# Patient Record
Sex: Female | Born: 1960
Health system: Southern US, Community
[De-identification: ages and names within clinical notes are randomized; demographics above are authoritative.]

## PROBLEM LIST (undated history)

## (undated) DIAGNOSIS — I6529 Occlusion and stenosis of unspecified carotid artery: Secondary | ICD-10-CM

## (undated) DIAGNOSIS — R51 Headache: Secondary | ICD-10-CM

## (undated) DIAGNOSIS — F329 Major depressive disorder, single episode, unspecified: Secondary | ICD-10-CM

## (undated) DIAGNOSIS — R519 Headache, unspecified: Secondary | ICD-10-CM

## (undated) DIAGNOSIS — I10 Essential (primary) hypertension: Secondary | ICD-10-CM

## (undated) DIAGNOSIS — F32A Depression, unspecified: Secondary | ICD-10-CM

## (undated) HISTORY — DX: Major depressive disorder, single episode, unspecified: F32.9

## (undated) HISTORY — DX: Headache: R51

## (undated) HISTORY — DX: Headache, unspecified: R51.9

## (undated) HISTORY — DX: Depression, unspecified: F32.A

## (undated) HISTORY — DX: Occlusion and stenosis of unspecified carotid artery: I65.29

---

## 1998-06-20 ENCOUNTER — Emergency Department (HOSPITAL_COMMUNITY): Admission: EM | Admit: 1998-06-20 | Discharge: 1998-06-20 | Payer: Self-pay | Admitting: Emergency Medicine

## 1999-05-26 ENCOUNTER — Emergency Department (HOSPITAL_COMMUNITY): Admission: EM | Admit: 1999-05-26 | Discharge: 1999-05-26 | Payer: Self-pay | Admitting: Emergency Medicine

## 1999-05-27 ENCOUNTER — Encounter: Payer: Self-pay | Admitting: Emergency Medicine

## 1999-05-28 ENCOUNTER — Emergency Department (HOSPITAL_COMMUNITY): Admission: EM | Admit: 1999-05-28 | Discharge: 1999-05-28 | Payer: Self-pay | Admitting: Emergency Medicine

## 1999-06-02 ENCOUNTER — Emergency Department (HOSPITAL_COMMUNITY): Admission: EM | Admit: 1999-06-02 | Discharge: 1999-06-02 | Payer: Self-pay | Admitting: Emergency Medicine

## 1999-08-28 ENCOUNTER — Encounter: Payer: Self-pay | Admitting: *Deleted

## 1999-08-28 ENCOUNTER — Emergency Department (HOSPITAL_COMMUNITY): Admission: EM | Admit: 1999-08-28 | Discharge: 1999-08-28 | Payer: Self-pay | Admitting: Emergency Medicine

## 1999-09-21 ENCOUNTER — Emergency Department (HOSPITAL_COMMUNITY): Admission: EM | Admit: 1999-09-21 | Discharge: 1999-09-21 | Payer: Self-pay | Admitting: Emergency Medicine

## 1999-09-30 ENCOUNTER — Emergency Department (HOSPITAL_COMMUNITY): Admission: EM | Admit: 1999-09-30 | Discharge: 1999-09-30 | Payer: Self-pay | Admitting: Internal Medicine

## 2000-08-27 ENCOUNTER — Emergency Department (HOSPITAL_COMMUNITY): Admission: EM | Admit: 2000-08-27 | Discharge: 2000-08-27 | Payer: Self-pay | Admitting: Emergency Medicine

## 2000-09-07 ENCOUNTER — Emergency Department (HOSPITAL_COMMUNITY): Admission: EM | Admit: 2000-09-07 | Discharge: 2000-09-07 | Payer: Self-pay | Admitting: Emergency Medicine

## 2001-02-10 ENCOUNTER — Emergency Department (HOSPITAL_COMMUNITY): Admission: EM | Admit: 2001-02-10 | Discharge: 2001-02-10 | Payer: Self-pay | Admitting: Emergency Medicine

## 2001-02-14 ENCOUNTER — Emergency Department (HOSPITAL_COMMUNITY): Admission: EM | Admit: 2001-02-14 | Discharge: 2001-02-14 | Payer: Self-pay | Admitting: Emergency Medicine

## 2001-11-24 ENCOUNTER — Emergency Department (HOSPITAL_COMMUNITY): Admission: EM | Admit: 2001-11-24 | Discharge: 2001-11-24 | Payer: Self-pay | Admitting: Emergency Medicine

## 2002-04-04 ENCOUNTER — Ambulatory Visit (HOSPITAL_COMMUNITY): Admission: RE | Admit: 2002-04-04 | Discharge: 2002-04-04 | Payer: Self-pay | Admitting: Family Medicine

## 2003-05-28 ENCOUNTER — Encounter: Payer: Self-pay | Admitting: Emergency Medicine

## 2003-05-28 ENCOUNTER — Emergency Department (HOSPITAL_COMMUNITY): Admission: EM | Admit: 2003-05-28 | Discharge: 2003-05-28 | Payer: Self-pay | Admitting: Emergency Medicine

## 2003-09-23 ENCOUNTER — Emergency Department (HOSPITAL_COMMUNITY): Admission: EM | Admit: 2003-09-23 | Discharge: 2003-09-23 | Payer: Self-pay | Admitting: Emergency Medicine

## 2004-08-26 ENCOUNTER — Ambulatory Visit (HOSPITAL_COMMUNITY): Payer: Self-pay | Admitting: Family Medicine

## 2004-09-27 ENCOUNTER — Ambulatory Visit: Payer: Self-pay | Admitting: Nurse Practitioner

## 2004-10-10 ENCOUNTER — Ambulatory Visit (HOSPITAL_COMMUNITY): Admission: RE | Admit: 2004-10-10 | Discharge: 2004-10-10 | Payer: Self-pay | Admitting: Family Medicine

## 2004-10-18 ENCOUNTER — Ambulatory Visit: Payer: Self-pay | Admitting: Nurse Practitioner

## 2004-10-21 ENCOUNTER — Ambulatory Visit: Payer: Self-pay | Admitting: *Deleted

## 2004-11-04 ENCOUNTER — Ambulatory Visit: Payer: Self-pay | Admitting: Nurse Practitioner

## 2005-06-23 ENCOUNTER — Emergency Department (HOSPITAL_COMMUNITY): Admission: EM | Admit: 2005-06-23 | Discharge: 2005-06-23 | Payer: Self-pay | Admitting: Emergency Medicine

## 2005-07-13 ENCOUNTER — Emergency Department (HOSPITAL_COMMUNITY): Admission: EM | Admit: 2005-07-13 | Discharge: 2005-07-13 | Payer: Self-pay | Admitting: Emergency Medicine

## 2006-02-09 ENCOUNTER — Emergency Department (HOSPITAL_COMMUNITY): Admission: EM | Admit: 2006-02-09 | Discharge: 2006-02-09 | Payer: Self-pay | Admitting: Emergency Medicine

## 2006-05-25 ENCOUNTER — Ambulatory Visit: Payer: Self-pay | Admitting: Nurse Practitioner

## 2006-06-04 ENCOUNTER — Ambulatory Visit: Payer: Self-pay | Admitting: Nurse Practitioner

## 2006-10-14 ENCOUNTER — Ambulatory Visit: Payer: Self-pay | Admitting: Nurse Practitioner

## 2006-10-26 ENCOUNTER — Emergency Department (HOSPITAL_COMMUNITY): Admission: EM | Admit: 2006-10-26 | Discharge: 2006-10-26 | Payer: Self-pay | Admitting: Emergency Medicine

## 2006-10-27 ENCOUNTER — Ambulatory Visit: Payer: Self-pay | Admitting: Nurse Practitioner

## 2006-11-02 ENCOUNTER — Emergency Department (HOSPITAL_COMMUNITY): Admission: EM | Admit: 2006-11-02 | Discharge: 2006-11-02 | Payer: Self-pay | Admitting: Emergency Medicine

## 2006-11-16 ENCOUNTER — Ambulatory Visit: Payer: Self-pay | Admitting: Nurse Practitioner

## 2006-11-16 ENCOUNTER — Encounter (INDEPENDENT_AMBULATORY_CARE_PROVIDER_SITE_OTHER): Payer: Self-pay | Admitting: Nurse Practitioner

## 2007-01-19 ENCOUNTER — Ambulatory Visit: Payer: Self-pay | Admitting: Nurse Practitioner

## 2007-04-16 ENCOUNTER — Ambulatory Visit: Payer: Self-pay | Admitting: Family Medicine

## 2007-04-23 ENCOUNTER — Ambulatory Visit (HOSPITAL_COMMUNITY): Admission: RE | Admit: 2007-04-23 | Discharge: 2007-04-23 | Payer: Self-pay | Admitting: Nurse Practitioner

## 2007-06-02 ENCOUNTER — Other Ambulatory Visit: Admission: RE | Admit: 2007-06-02 | Discharge: 2007-06-02 | Payer: Self-pay | Admitting: Obstetrics and Gynecology

## 2007-06-02 ENCOUNTER — Encounter: Payer: Self-pay | Admitting: Obstetrics and Gynecology

## 2007-06-02 ENCOUNTER — Ambulatory Visit: Payer: Self-pay | Admitting: *Deleted

## 2007-06-16 ENCOUNTER — Encounter (INDEPENDENT_AMBULATORY_CARE_PROVIDER_SITE_OTHER): Payer: Self-pay | Admitting: *Deleted

## 2007-06-18 ENCOUNTER — Encounter: Payer: Self-pay | Admitting: Obstetrics and Gynecology

## 2007-06-18 ENCOUNTER — Ambulatory Visit: Payer: Self-pay | Admitting: Obstetrics & Gynecology

## 2007-06-18 ENCOUNTER — Other Ambulatory Visit: Admission: RE | Admit: 2007-06-18 | Discharge: 2007-06-18 | Payer: Self-pay | Admitting: Obstetrics and Gynecology

## 2007-09-21 ENCOUNTER — Encounter: Payer: Self-pay | Admitting: Obstetrics & Gynecology

## 2007-09-21 ENCOUNTER — Ambulatory Visit: Payer: Self-pay | Admitting: Obstetrics & Gynecology

## 2007-09-21 ENCOUNTER — Inpatient Hospital Stay (HOSPITAL_COMMUNITY): Admission: RE | Admit: 2007-09-21 | Discharge: 2007-09-23 | Payer: Self-pay | Admitting: Obstetrics & Gynecology

## 2007-09-22 HISTORY — PX: ABDOMINAL HYSTERECTOMY: SHX81

## 2007-09-28 ENCOUNTER — Ambulatory Visit: Payer: Self-pay | Admitting: Obstetrics and Gynecology

## 2007-10-01 ENCOUNTER — Ambulatory Visit: Payer: Self-pay | Admitting: Family Medicine

## 2007-10-08 ENCOUNTER — Ambulatory Visit: Payer: Self-pay | Admitting: Obstetrics & Gynecology

## 2007-10-13 ENCOUNTER — Ambulatory Visit: Payer: Self-pay | Admitting: Internal Medicine

## 2008-02-03 ENCOUNTER — Encounter (INDEPENDENT_AMBULATORY_CARE_PROVIDER_SITE_OTHER): Payer: Self-pay | Admitting: Nurse Practitioner

## 2008-02-03 ENCOUNTER — Ambulatory Visit: Payer: Self-pay | Admitting: Internal Medicine

## 2008-02-03 LAB — CONVERTED CEMR LAB
ALT: 61 units/L — ABNORMAL HIGH (ref 0–35)
AST: 82 units/L — ABNORMAL HIGH (ref 0–37)
Albumin: 4.6 g/dL (ref 3.5–5.2)
Alkaline Phosphatase: 96 units/L (ref 39–117)
BUN: 12 mg/dL (ref 6–23)
Basophils Absolute: 0 10*3/uL (ref 0.0–0.1)
Basophils Relative: 0 % (ref 0–1)
CO2: 26 meq/L (ref 19–32)
Calcium: 10.2 mg/dL (ref 8.4–10.5)
Chloride: 102 meq/L (ref 96–112)
Cholesterol: 151 mg/dL (ref 0–200)
Creatinine, Ser: 0.94 mg/dL (ref 0.40–1.20)
Eosinophils Absolute: 0.1 10*3/uL (ref 0.0–0.7)
Eosinophils Relative: 1 % (ref 0–5)
Glucose, Bld: 94 mg/dL (ref 70–99)
HCT: 44.7 % (ref 36.0–46.0)
HDL: 89 mg/dL (ref >39)
Hemoglobin: 14.3 g/dL (ref 12.0–15.0)
LDL Cholesterol: 41 mg/dL (ref 0–99)
Lymphocytes Relative: 34 % (ref 12–46)
Lymphs Abs: 2.6 10*3/uL (ref 0.7–4.0)
MCHC: 32 g/dL (ref 30.0–36.0)
MCV: 83.6 fL (ref 78.0–100.0)
Monocytes Absolute: 0.7 10*3/uL (ref 0.1–1.0)
Monocytes Relative: 9 % (ref 3–12)
Neutro Abs: 4.3 10*3/uL (ref 1.7–7.7)
Neutrophils Relative %: 56 % (ref 43–77)
Platelets: 339 10*3/uL (ref 150–400)
Potassium: 4.4 meq/L (ref 3.5–5.3)
RBC: 5.35 M/uL — ABNORMAL HIGH (ref 3.87–5.11)
RDW: 19.1 % — ABNORMAL HIGH (ref 11.5–15.5)
Sodium: 141 meq/L (ref 135–145)
TSH: 1.398 microintl units/mL (ref 0.350–5.50)
Total Bilirubin: 0.4 mg/dL (ref 0.3–1.2)
Total CHOL/HDL Ratio: 1.7
Total Protein: 8.3 g/dL (ref 6.0–8.3)
Triglycerides: 103 mg/dL (ref <150)
VLDL: 21 mg/dL (ref 0–40)
WBC: 7.7 10*3/uL (ref 4.0–10.5)

## 2008-02-07 ENCOUNTER — Ambulatory Visit (HOSPITAL_COMMUNITY): Admission: RE | Admit: 2008-02-07 | Discharge: 2008-02-07 | Payer: Self-pay | Admitting: Family Medicine

## 2008-03-16 ENCOUNTER — Encounter: Admission: RE | Admit: 2008-03-16 | Discharge: 2008-03-16 | Payer: Self-pay | Admitting: Cardiovascular Disease

## 2008-05-31 ENCOUNTER — Emergency Department (HOSPITAL_COMMUNITY): Admission: EM | Admit: 2008-05-31 | Discharge: 2008-05-31 | Payer: Self-pay | Admitting: Family Medicine

## 2008-06-15 ENCOUNTER — Emergency Department (HOSPITAL_COMMUNITY): Admission: EM | Admit: 2008-06-15 | Discharge: 2008-06-15 | Payer: Self-pay | Admitting: Emergency Medicine

## 2008-06-19 ENCOUNTER — Encounter (INDEPENDENT_AMBULATORY_CARE_PROVIDER_SITE_OTHER): Payer: Self-pay | Admitting: Nurse Practitioner

## 2008-06-19 ENCOUNTER — Ambulatory Visit: Payer: Self-pay | Admitting: Internal Medicine

## 2008-06-19 LAB — CONVERTED CEMR LAB
ALT: 97 units/L — ABNORMAL HIGH (ref 0–35)
AST: 79 units/L — ABNORMAL HIGH (ref 0–37)
Albumin: 4.8 g/dL (ref 3.5–5.2)
Alkaline Phosphatase: 113 units/L (ref 39–117)
BUN: 16 mg/dL (ref 6–23)
Basophils Absolute: 0 10*3/uL (ref 0.0–0.1)
Basophils Relative: 0 % (ref 0–1)
CO2: 28 meq/L (ref 19–32)
Calcium: 10.5 mg/dL (ref 8.4–10.5)
Chloride: 98 meq/L (ref 96–112)
Creatinine, Ser: 0.96 mg/dL (ref 0.40–1.20)
Eosinophils Absolute: 0.1 10*3/uL (ref 0.0–0.7)
Eosinophils Relative: 1 % (ref 0–5)
Glucose, Bld: 91 mg/dL (ref 70–99)
HCT: 41.8 % (ref 36.0–46.0)
Hemoglobin: 13.8 g/dL (ref 12.0–15.0)
Lymphocytes Relative: 39 % (ref 12–46)
Lymphs Abs: 4.8 10*3/uL — ABNORMAL HIGH (ref 0.7–4.0)
MCHC: 33 g/dL (ref 30.0–36.0)
MCV: 88.6 fL (ref 78.0–100.0)
Monocytes Absolute: 1.2 10*3/uL — ABNORMAL HIGH (ref 0.1–1.0)
Monocytes Relative: 10 % (ref 3–12)
Neutro Abs: 6.2 10*3/uL (ref 1.7–7.7)
Neutrophils Relative %: 50 % (ref 43–77)
Platelets: 401 10*3/uL — ABNORMAL HIGH (ref 150–400)
Potassium: 3.7 meq/L (ref 3.5–5.3)
RBC: 4.72 M/uL (ref 3.87–5.11)
RDW: 13.7 % (ref 11.5–15.5)
Sed Rate: 4 mm/hr (ref 0–22)
Sodium: 138 meq/L (ref 135–145)
TSH: 1.433 microintl units/mL (ref 0.350–4.50)
Total Bilirubin: 0.5 mg/dL (ref 0.3–1.2)
Total Protein: 8.4 g/dL — ABNORMAL HIGH (ref 6.0–8.3)
WBC: 12.3 10*3/uL — ABNORMAL HIGH (ref 4.0–10.5)

## 2008-06-22 ENCOUNTER — Ambulatory Visit (HOSPITAL_COMMUNITY): Admission: RE | Admit: 2008-06-22 | Discharge: 2008-06-22 | Payer: Self-pay | Admitting: Internal Medicine

## 2008-07-03 ENCOUNTER — Ambulatory Visit: Payer: Self-pay | Admitting: Internal Medicine

## 2008-07-10 ENCOUNTER — Ambulatory Visit: Payer: Self-pay | Admitting: Internal Medicine

## 2008-07-10 LAB — CONVERTED CEMR LAB
Basophils Absolute: 0 10*3/uL (ref 0.0–0.1)
Basophils Relative: 0 % (ref 0–1)
Eosinophils Absolute: 0 10*3/uL (ref 0.0–0.7)
Eosinophils Relative: 1 % (ref 0–5)
HCT: 44.7 % (ref 36.0–46.0)
HCV Quantitative: 8110000 intl units/mL — ABNORMAL HIGH (ref <43)
Hemoglobin: 14.2 g/dL (ref 12.0–15.0)
Lymphocytes Relative: 31 % (ref 12–46)
Lymphs Abs: 2.7 10*3/uL (ref 0.7–4.0)
MCHC: 31.8 g/dL (ref 30.0–36.0)
MCV: 89.6 fL (ref 78.0–100.0)
Monocytes Absolute: 0.7 10*3/uL (ref 0.1–1.0)
Monocytes Relative: 8 % (ref 3–12)
Neutro Abs: 5.1 10*3/uL (ref 1.7–7.7)
Neutrophils Relative %: 60 % (ref 43–77)
Platelets: 279 10*3/uL (ref 150–400)
RBC: 4.99 M/uL (ref 3.87–5.11)
RDW: 13.5 % (ref 11.5–15.5)
WBC: 8.5 10*3/uL (ref 4.0–10.5)

## 2008-09-15 ENCOUNTER — Ambulatory Visit: Payer: Self-pay | Admitting: Internal Medicine

## 2008-09-15 ENCOUNTER — Encounter (INDEPENDENT_AMBULATORY_CARE_PROVIDER_SITE_OTHER): Payer: Self-pay | Admitting: Adult Health

## 2008-09-15 LAB — CONVERTED CEMR LAB
ALT: 59 units/L — ABNORMAL HIGH (ref 0–35)
AST: 38 units/L — ABNORMAL HIGH (ref 0–37)
Albumin: 4.8 g/dL (ref 3.5–5.2)
Alkaline Phosphatase: 118 units/L — ABNORMAL HIGH (ref 39–117)
BUN: 14 mg/dL (ref 6–23)
Basophils Absolute: 0 10*3/uL (ref 0.0–0.1)
Basophils Relative: 0 % (ref 0–1)
CO2: 26 meq/L (ref 19–32)
Calcium: 10 mg/dL (ref 8.4–10.5)
Chloride: 101 meq/L (ref 96–112)
Creatinine, Ser: 1 mg/dL (ref 0.40–1.20)
Eosinophils Absolute: 0 10*3/uL (ref 0.0–0.7)
Eosinophils Relative: 1 % (ref 0–5)
Glucose, Bld: 84 mg/dL (ref 70–99)
HCT: 41.8 % (ref 36.0–46.0)
Hemoglobin: 13.4 g/dL (ref 12.0–15.0)
Lymphocytes Relative: 39 % (ref 12–46)
Lymphs Abs: 2.9 10*3/uL (ref 0.7–4.0)
MCHC: 32.1 g/dL (ref 30.0–36.0)
MCV: 89.5 fL (ref 78.0–100.0)
Monocytes Absolute: 0.7 10*3/uL (ref 0.1–1.0)
Monocytes Relative: 9 % (ref 3–12)
Neutro Abs: 3.8 10*3/uL (ref 1.7–7.7)
Neutrophils Relative %: 52 % (ref 43–77)
Platelets: 307 10*3/uL (ref 150–400)
Potassium: 4.2 meq/L (ref 3.5–5.3)
RBC: 4.67 M/uL (ref 3.87–5.11)
RDW: 12.9 % (ref 11.5–15.5)
Sodium: 142 meq/L (ref 135–145)
Total Bilirubin: 0.3 mg/dL (ref 0.3–1.2)
Total Protein: 8.1 g/dL (ref 6.0–8.3)
WBC: 7.3 10*3/uL (ref 4.0–10.5)

## 2008-09-26 ENCOUNTER — Ambulatory Visit: Payer: Self-pay | Admitting: Internal Medicine

## 2008-10-03 ENCOUNTER — Ambulatory Visit: Payer: Self-pay | Admitting: Internal Medicine

## 2008-10-10 ENCOUNTER — Ambulatory Visit: Payer: Self-pay | Admitting: Internal Medicine

## 2008-10-18 ENCOUNTER — Ambulatory Visit: Payer: Self-pay | Admitting: Internal Medicine

## 2008-10-19 ENCOUNTER — Ambulatory Visit: Payer: Self-pay | Admitting: Internal Medicine

## 2008-10-20 ENCOUNTER — Ambulatory Visit (HOSPITAL_COMMUNITY): Admission: RE | Admit: 2008-10-20 | Discharge: 2008-10-20 | Payer: Self-pay | Admitting: Adult Health

## 2008-10-20 ENCOUNTER — Ambulatory Visit: Payer: Self-pay | Admitting: Internal Medicine

## 2008-10-20 ENCOUNTER — Encounter (INDEPENDENT_AMBULATORY_CARE_PROVIDER_SITE_OTHER): Payer: Self-pay | Admitting: Adult Health

## 2008-10-20 LAB — CONVERTED CEMR LAB
ALT: 70 units/L — ABNORMAL HIGH (ref 0–35)
AST: 55 units/L — ABNORMAL HIGH (ref 0–37)
Albumin: 4.5 g/dL (ref 3.5–5.2)
Alkaline Phosphatase: 133 units/L — ABNORMAL HIGH (ref 39–117)
Anti Nuclear Antibody(ANA): NEGATIVE
BUN: 14 mg/dL (ref 6–23)
CO2: 26 meq/L (ref 19–32)
Calcium: 9.8 mg/dL (ref 8.4–10.5)
Chloride: 99 meq/L (ref 96–112)
Creatinine, Ser: 0.79 mg/dL (ref 0.40–1.20)
Glucose, Bld: 85 mg/dL (ref 70–99)
Potassium: 4.1 meq/L (ref 3.5–5.3)
Rhuematoid fact SerPl-aCnc: 113 intl units/mL — ABNORMAL HIGH (ref 0–20)
Sed Rate: 3 mm/hr (ref 0–22)
Sodium: 140 meq/L (ref 135–145)
Total Bilirubin: 0.5 mg/dL (ref 0.3–1.2)
Total Protein: 8 g/dL (ref 6.0–8.3)

## 2008-12-13 ENCOUNTER — Ambulatory Visit: Payer: Self-pay | Admitting: Internal Medicine

## 2008-12-20 ENCOUNTER — Ambulatory Visit (HOSPITAL_COMMUNITY): Admission: RE | Admit: 2008-12-20 | Discharge: 2008-12-20 | Payer: Self-pay | Admitting: Internal Medicine

## 2009-03-02 ENCOUNTER — Encounter: Payer: Self-pay | Admitting: Obstetrics & Gynecology

## 2009-03-02 ENCOUNTER — Ambulatory Visit: Payer: Self-pay | Admitting: Obstetrics & Gynecology

## 2009-03-05 ENCOUNTER — Ambulatory Visit (HOSPITAL_COMMUNITY): Admission: RE | Admit: 2009-03-05 | Discharge: 2009-03-05 | Payer: Self-pay | Admitting: Family Medicine

## 2009-03-06 ENCOUNTER — Ambulatory Visit (HOSPITAL_COMMUNITY): Admission: RE | Admit: 2009-03-06 | Discharge: 2009-03-06 | Payer: Self-pay | Admitting: Internal Medicine

## 2009-03-08 ENCOUNTER — Ambulatory Visit: Payer: Self-pay | Admitting: Internal Medicine

## 2009-03-23 ENCOUNTER — Ambulatory Visit: Payer: Self-pay | Admitting: Obstetrics & Gynecology

## 2009-04-05 ENCOUNTER — Ambulatory Visit: Payer: Self-pay | Admitting: Internal Medicine

## 2009-05-09 ENCOUNTER — Encounter (INDEPENDENT_AMBULATORY_CARE_PROVIDER_SITE_OTHER): Payer: Self-pay | Admitting: Adult Health

## 2009-05-09 ENCOUNTER — Ambulatory Visit: Payer: Self-pay | Admitting: Internal Medicine

## 2009-05-09 LAB — CONVERTED CEMR LAB
ALT: 64 units/L — ABNORMAL HIGH (ref 0–35)
AST: 56 units/L — ABNORMAL HIGH (ref 0–37)
Albumin: 4.5 g/dL (ref 3.5–5.2)
Alkaline Phosphatase: 123 units/L — ABNORMAL HIGH (ref 39–117)
BUN: 11 mg/dL (ref 6–23)
Basophils Absolute: 0 10*3/uL (ref 0.0–0.1)
Basophils Relative: 0 % (ref 0–1)
CO2: 27 meq/L (ref 19–32)
Calcium: 10.2 mg/dL (ref 8.4–10.5)
Chlamydia, DNA Probe: NEGATIVE
Chloride: 99 meq/L (ref 96–112)
Creatinine, Ser: 1.02 mg/dL (ref 0.40–1.20)
Eosinophils Absolute: 0.1 10*3/uL (ref 0.0–0.7)
Eosinophils Relative: 2 % (ref 0–5)
GC Probe Amp, Genital: NEGATIVE
Glucose, Bld: 103 mg/dL — ABNORMAL HIGH (ref 70–99)
HCT: 41.7 % (ref 36.0–46.0)
Hemoglobin: 13.3 g/dL (ref 12.0–15.0)
Lymphocytes Relative: 33 % (ref 12–46)
Lymphs Abs: 2.4 10*3/uL (ref 0.7–4.0)
MCHC: 31.9 g/dL (ref 30.0–36.0)
MCV: 85.3 fL (ref 78.0–100.0)
Microalb, Ur: 0.5 mg/dL (ref 0.00–1.89)
Monocytes Absolute: 0.6 10*3/uL (ref 0.1–1.0)
Monocytes Relative: 8 % (ref 3–12)
Neutro Abs: 4.2 10*3/uL (ref 1.7–7.7)
Neutrophils Relative %: 57 % (ref 43–77)
Platelets: 299 10*3/uL (ref 150–400)
Potassium: 4.3 meq/L (ref 3.5–5.3)
RBC: 4.89 M/uL (ref 3.87–5.11)
RDW: 14 % (ref 11.5–15.5)
Sodium: 139 meq/L (ref 135–145)
TSH: 0.784 microintl units/mL (ref 0.350–4.500)
Total Bilirubin: 0.3 mg/dL (ref 0.3–1.2)
Total Protein: 7.6 g/dL (ref 6.0–8.3)
Vit D, 25-Hydroxy: 32 ng/mL (ref 30–89)
WBC: 7.3 10*3/uL (ref 4.0–10.5)

## 2009-05-21 ENCOUNTER — Ambulatory Visit: Payer: Self-pay | Admitting: Internal Medicine

## 2009-06-18 ENCOUNTER — Ambulatory Visit: Payer: Self-pay | Admitting: Internal Medicine

## 2009-07-31 ENCOUNTER — Ambulatory Visit: Payer: Self-pay | Admitting: Internal Medicine

## 2009-08-29 ENCOUNTER — Ambulatory Visit: Payer: Self-pay | Admitting: Internal Medicine

## 2009-08-29 ENCOUNTER — Encounter (INDEPENDENT_AMBULATORY_CARE_PROVIDER_SITE_OTHER): Payer: Self-pay | Admitting: Adult Health

## 2009-08-29 LAB — CONVERTED CEMR LAB
ALT: 61 units/L — ABNORMAL HIGH (ref 0–35)
AST: 57 units/L — ABNORMAL HIGH (ref 0–37)
Albumin: 4.7 g/dL (ref 3.5–5.2)
Alkaline Phosphatase: 116 units/L (ref 39–117)
BUN: 22 mg/dL (ref 6–23)
Basophils Absolute: 0 10*3/uL (ref 0.0–0.1)
Basophils Relative: 0 % (ref 0–1)
CO2: 30 meq/L (ref 19–32)
Calcium: 10.4 mg/dL (ref 8.4–10.5)
Chloride: 99 meq/L (ref 96–112)
Creatinine, Ser: 1.06 mg/dL (ref 0.40–1.20)
Eosinophils Absolute: 0.1 10*3/uL (ref 0.0–0.7)
Eosinophils Relative: 1 % (ref 0–5)
Glucose, Bld: 92 mg/dL (ref 70–99)
HCT: 45.6 % (ref 36.0–46.0)
Hemoglobin: 14.7 g/dL (ref 12.0–15.0)
INR: 1.05 (ref <1.50)
Lymphocytes Relative: 35 % (ref 12–46)
Lymphs Abs: 3.2 10*3/uL (ref 0.7–4.0)
MCHC: 32.2 g/dL (ref 30.0–36.0)
MCV: 83.1 fL (ref 78.0–100.0)
Monocytes Absolute: 0.6 10*3/uL (ref 0.1–1.0)
Monocytes Relative: 6 % (ref 3–12)
Neutro Abs: 5.3 10*3/uL (ref 1.7–7.7)
Neutrophils Relative %: 57 % (ref 43–77)
Platelets: 335 10*3/uL (ref 150–400)
Potassium: 4 meq/L (ref 3.5–5.3)
Prothrombin Time: 13.6 s (ref 11.6–15.2)
RBC: 5.49 M/uL — ABNORMAL HIGH (ref 3.87–5.11)
RDW: 14.3 % (ref 11.5–15.5)
Sodium: 140 meq/L (ref 135–145)
Total Bilirubin: 0.4 mg/dL (ref 0.3–1.2)
Total Protein: 7.7 g/dL (ref 6.0–8.3)
WBC: 9.2 10*3/uL (ref 4.0–10.5)

## 2009-09-21 ENCOUNTER — Emergency Department (HOSPITAL_COMMUNITY): Admission: EM | Admit: 2009-09-21 | Discharge: 2009-09-21 | Payer: Self-pay | Admitting: Emergency Medicine

## 2009-10-03 ENCOUNTER — Ambulatory Visit: Payer: Self-pay | Admitting: Internal Medicine

## 2009-10-03 ENCOUNTER — Encounter (INDEPENDENT_AMBULATORY_CARE_PROVIDER_SITE_OTHER): Payer: Self-pay | Admitting: Adult Health

## 2009-10-03 LAB — CONVERTED CEMR LAB
ALT: 61 units/L — ABNORMAL HIGH (ref 0–35)
AST: 56 units/L — ABNORMAL HIGH (ref 0–37)
Albumin: 4.4 g/dL (ref 3.5–5.2)
Alkaline Phosphatase: 114 units/L (ref 39–117)
BUN: 21 mg/dL (ref 6–23)
CO2: 26 meq/L (ref 19–32)
Calcium: 9.9 mg/dL (ref 8.4–10.5)
Chloride: 101 meq/L (ref 96–112)
Creatinine, Ser: 0.83 mg/dL (ref 0.40–1.20)
Glucose, Bld: 103 mg/dL — ABNORMAL HIGH (ref 70–99)
Potassium: 3.9 meq/L (ref 3.5–5.3)
Sodium: 141 meq/L (ref 135–145)
Total Bilirubin: 0.4 mg/dL (ref 0.3–1.2)
Total Protein: 7.3 g/dL (ref 6.0–8.3)

## 2009-11-14 ENCOUNTER — Ambulatory Visit: Payer: Self-pay | Admitting: Internal Medicine

## 2009-12-11 ENCOUNTER — Encounter: Payer: Self-pay | Admitting: Emergency Medicine

## 2009-12-11 ENCOUNTER — Inpatient Hospital Stay (HOSPITAL_COMMUNITY): Admission: AD | Admit: 2009-12-11 | Discharge: 2009-12-12 | Payer: Self-pay | Admitting: Neurosurgery

## 2009-12-25 ENCOUNTER — Ambulatory Visit: Payer: Self-pay | Admitting: Family Medicine

## 2010-01-02 ENCOUNTER — Emergency Department (HOSPITAL_COMMUNITY): Admission: EM | Admit: 2010-01-02 | Discharge: 2010-01-02 | Payer: Self-pay | Admitting: Emergency Medicine

## 2010-01-22 ENCOUNTER — Ambulatory Visit (HOSPITAL_COMMUNITY): Admission: RE | Admit: 2010-01-22 | Discharge: 2010-01-22 | Payer: Self-pay | Admitting: Neurosurgery

## 2010-01-24 ENCOUNTER — Encounter (INDEPENDENT_AMBULATORY_CARE_PROVIDER_SITE_OTHER): Payer: Self-pay | Admitting: Adult Health

## 2010-01-24 ENCOUNTER — Ambulatory Visit: Payer: Self-pay | Admitting: Internal Medicine

## 2010-01-24 LAB — CONVERTED CEMR LAB
ALT: 41 units/L — ABNORMAL HIGH (ref 0–35)
AST: 38 units/L — ABNORMAL HIGH (ref 0–37)
Albumin: 4.4 g/dL (ref 3.5–5.2)
Alkaline Phosphatase: 95 units/L (ref 39–117)
BUN: 14 mg/dL (ref 6–23)
CO2: 27 meq/L (ref 19–32)
Calcium: 9.9 mg/dL (ref 8.4–10.5)
Chloride: 102 meq/L (ref 96–112)
Cholesterol: 136 mg/dL (ref 0–200)
Creatinine, Ser: 0.84 mg/dL (ref 0.40–1.20)
Glucose, Bld: 147 mg/dL — ABNORMAL HIGH (ref 70–99)
HDL: 45 mg/dL (ref >39)
LDL Cholesterol: 63 mg/dL (ref 0–99)
Potassium: 4.3 meq/L (ref 3.5–5.3)
Sodium: 141 meq/L (ref 135–145)
Total Bilirubin: 0.5 mg/dL (ref 0.3–1.2)
Total CHOL/HDL Ratio: 3
Total Protein: 7.2 g/dL (ref 6.0–8.3)
Triglycerides: 138 mg/dL (ref <150)
VLDL: 28 mg/dL (ref 0–40)

## 2010-02-18 ENCOUNTER — Ambulatory Visit: Payer: Self-pay | Admitting: Surgery

## 2010-02-23 ENCOUNTER — Emergency Department (HOSPITAL_COMMUNITY): Admission: EM | Admit: 2010-02-23 | Discharge: 2010-02-23 | Payer: Self-pay | Admitting: Emergency Medicine

## 2010-03-27 ENCOUNTER — Encounter (INDEPENDENT_AMBULATORY_CARE_PROVIDER_SITE_OTHER): Payer: Self-pay | Admitting: Adult Health

## 2010-03-27 ENCOUNTER — Ambulatory Visit: Payer: Self-pay | Admitting: Internal Medicine

## 2010-03-27 LAB — CONVERTED CEMR LAB: Microalb, Ur: 0.5 mg/dL (ref 0.00–1.89)

## 2010-04-02 ENCOUNTER — Ambulatory Visit (HOSPITAL_COMMUNITY): Admission: RE | Admit: 2010-04-02 | Discharge: 2010-04-02 | Payer: Self-pay | Admitting: Internal Medicine

## 2010-04-11 ENCOUNTER — Ambulatory Visit (HOSPITAL_COMMUNITY): Admission: RE | Admit: 2010-04-11 | Discharge: 2010-04-11 | Payer: Self-pay | Admitting: Family Medicine

## 2010-05-07 ENCOUNTER — Ambulatory Visit: Payer: Self-pay | Admitting: Family Medicine

## 2010-05-11 ENCOUNTER — Emergency Department (HOSPITAL_COMMUNITY): Admission: EM | Admit: 2010-05-11 | Discharge: 2010-05-11 | Payer: Self-pay | Admitting: Emergency Medicine

## 2010-07-04 ENCOUNTER — Ambulatory Visit: Payer: Self-pay | Admitting: Gastroenterology

## 2010-08-13 ENCOUNTER — Emergency Department (HOSPITAL_COMMUNITY)
Admission: EM | Admit: 2010-08-13 | Discharge: 2010-08-13 | Payer: Self-pay | Source: Home / Self Care | Admitting: Emergency Medicine

## 2010-10-29 NOTE — Miscellaneous (Signed)
Summary: VIP  Patient: Morgan Davenport [name] Note: All result statuses are Final unless otherwise noted.  Tests: (1) VIP (Medications)   LLIMPORTMEDS              "Result Below..."       RESULT: PENICILLIN V POTASSIUM TABS 500 MG*TAKE ONE (1) TABLET FOUR (4)  TIMES PER DAY*10/27/2006*Last Refill: [id number]*******   LLIMPORTMEDS              "Result Below..."       RESULT: METRONIDAZOLE TABS 500 MG*TAKE TWO (2) TABLETS TWICE DAILY FOR 2 DAYS*01/19/2007*Last Refill: [id number]*******   LLIMPORTMEDS              "Result Below..."       RESULT: LEXAPRO TABS 20 MG*TAKE ONE (1) TABLET EACH DAY*01/19/2007*Last Refill: Unknown*[id number]*******   LLIMPORTALLS              NKDA***  Note: An exclamation mark (!) indicates a result that was not dispersed into the flowsheet. Document Creation Date: 07/29/2007 3:05 PM _______________________________________________________________________  (1) Order result status: Final Collection or observation date-time: 06/16/2007 Requested date-time: 06/16/2007 Receipt date-time:  Reported date-time: 06/16/2007 Referring Physician:   Ordering Physician:   Specimen Source:  Source: [place] Order Number:  Lab site:

## 2010-10-30 IMAGING — CT CT HEAD W/O CM
1 series · 15 of 28 positions shown, 19 images · non-contrast
Comparison: 12/11/2009.

CLINICAL DATA: 48-year-old female with headache and nausea.

CT HEAD WITHOUT CONTRAST
TECHNIQUE: Contiguous axial images were obtained from the base of
the skull through the vertex without contrast.

[Series 2: brain · axial · 0.47mm/px · z∈[+111,+236]mm · 15 of 28 slices shown, 19 images]
[im 2/28  brain]
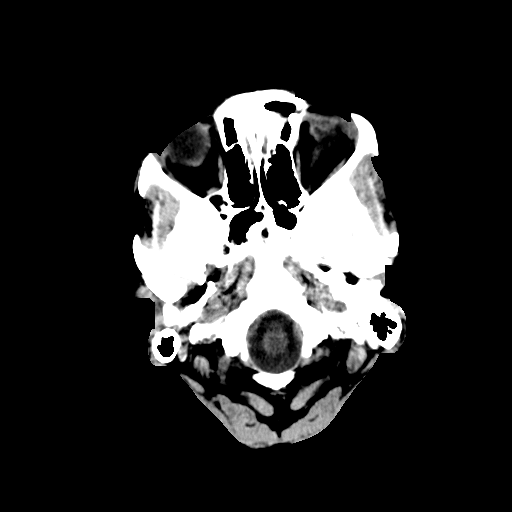
[im 2/28  bone]
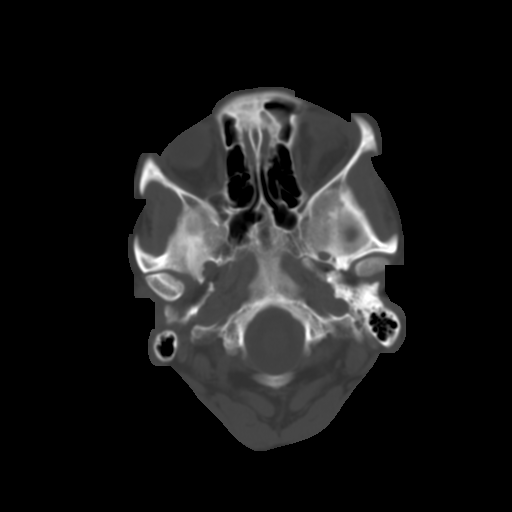
[im 4/28  brain]
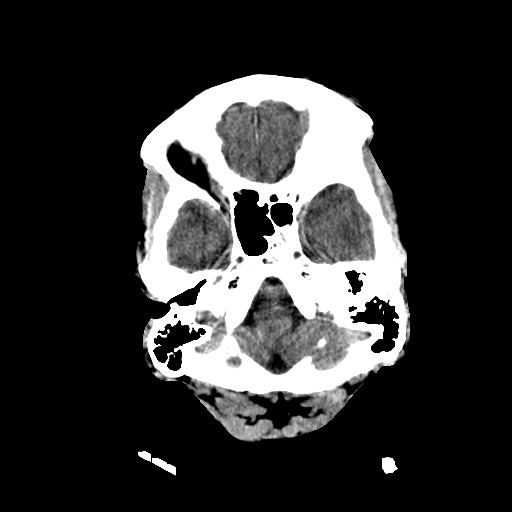
[im 6/28  brain]
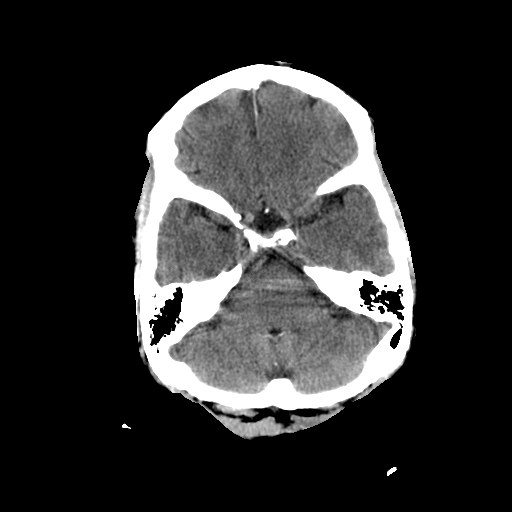
[im 8/28  brain]
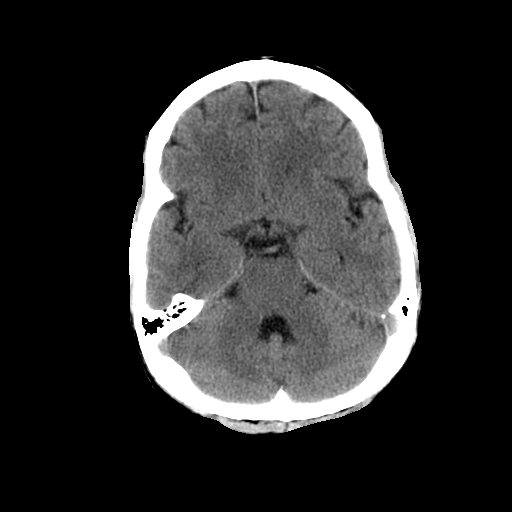
[im 9/28  brain]
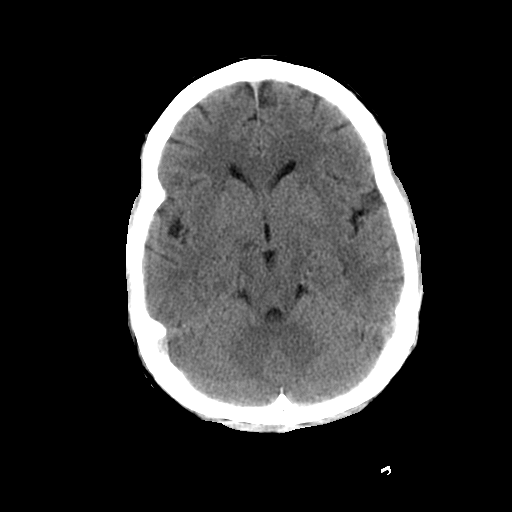
[im 9/28  bone]
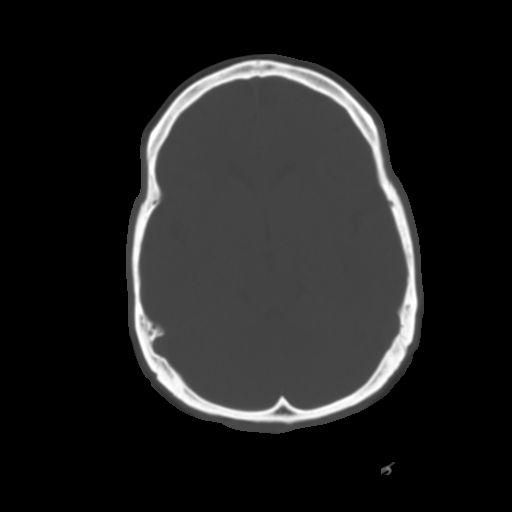
[im 11/28  brain]
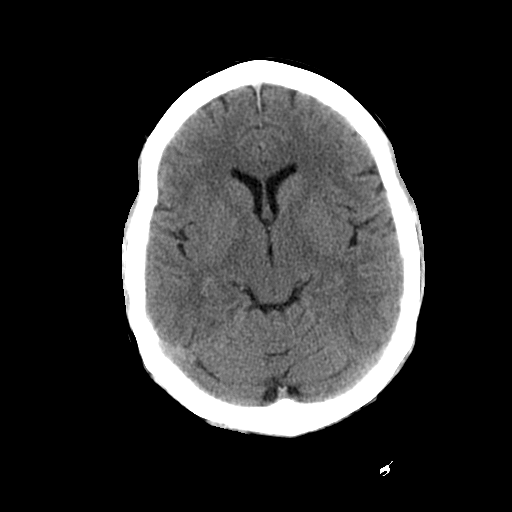
[im 13/28  brain]
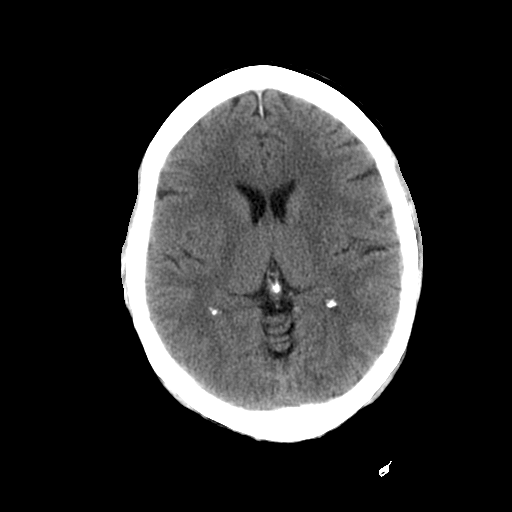
[im 15/28  brain]
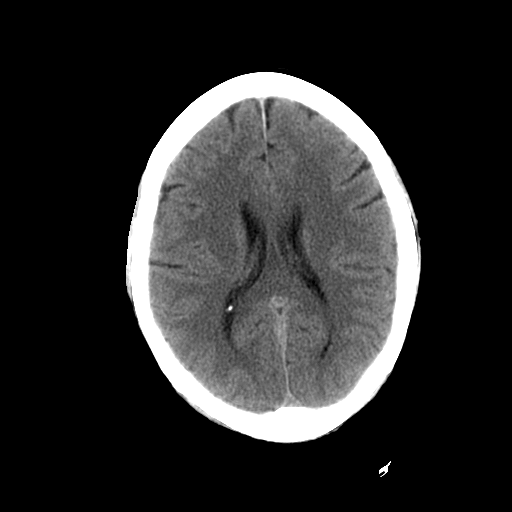
[im 16/28  brain]
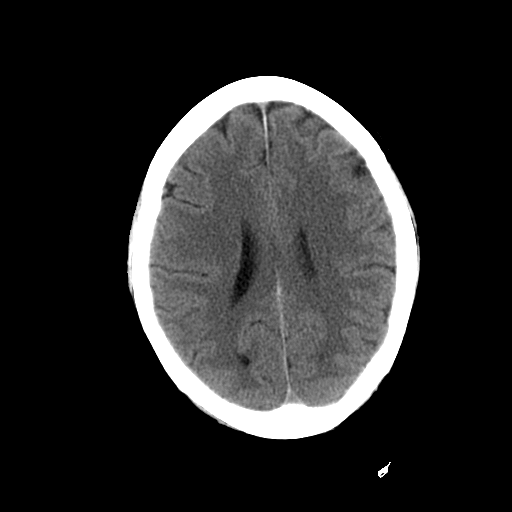
[im 16/28  bone]
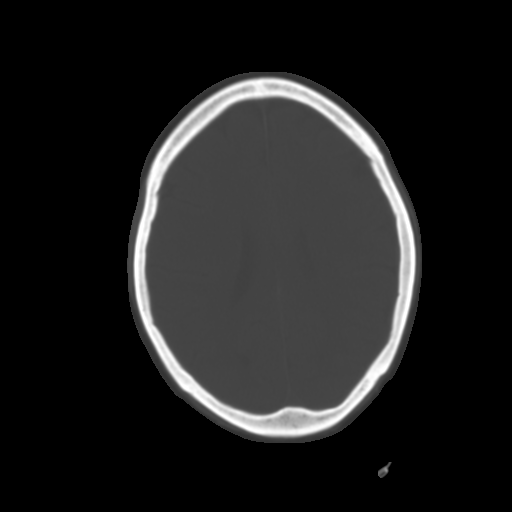
[im 18/28  brain]
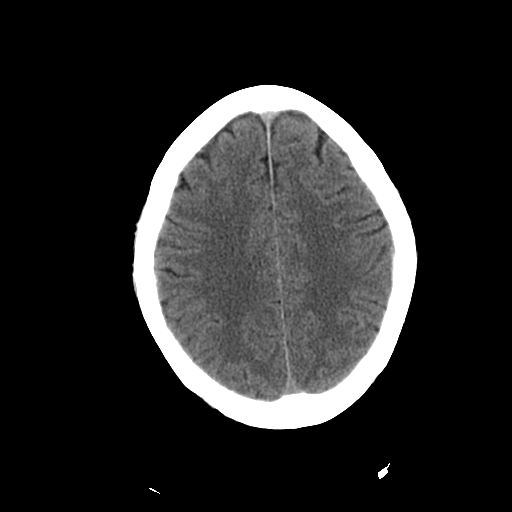
[im 20/28  brain]
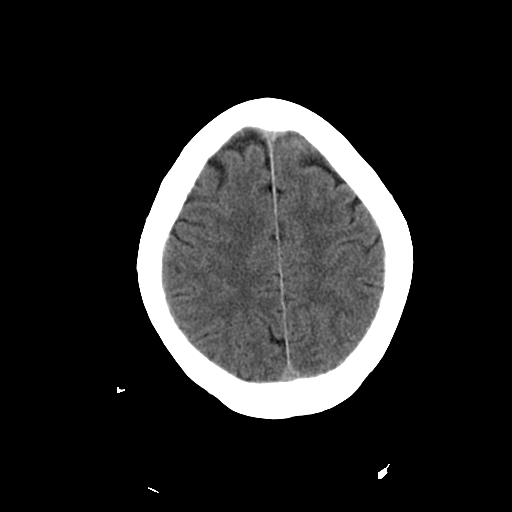
[im 21/28  brain]
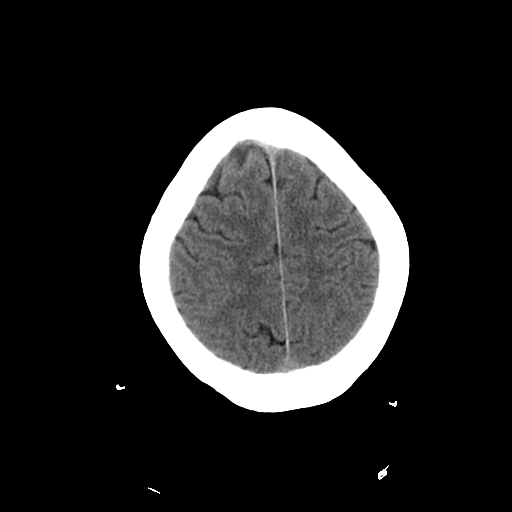
[im 23/28  brain]
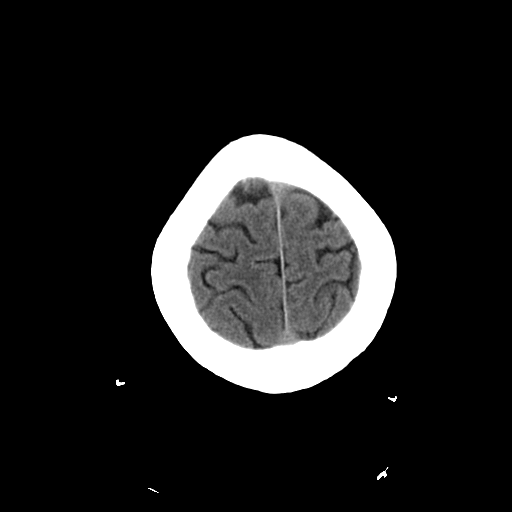
[im 23/28  bone]
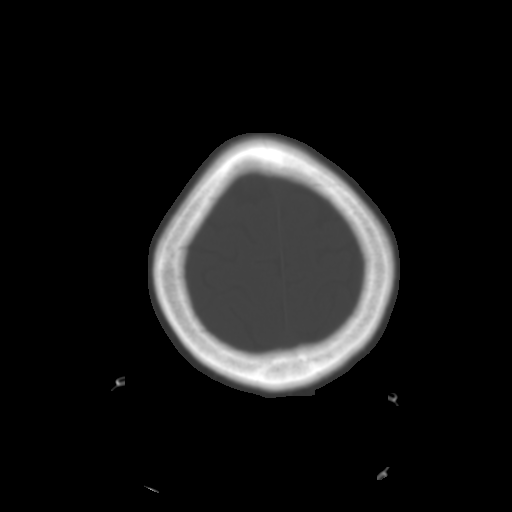
[im 25/28  brain]
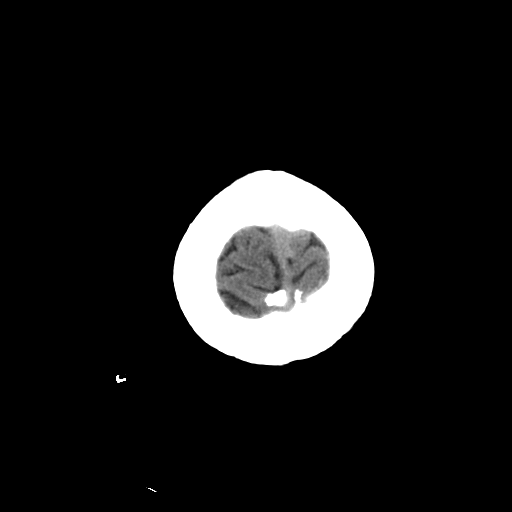
[im 27/28  brain]
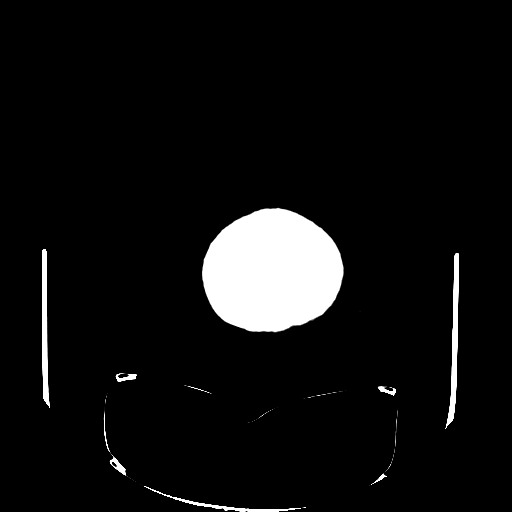

[15 of 28 positions shown; findings below may reference images not displayed]

FINDINGS: Visualized orbits and scalp soft tissues are within
normal limits.  Adenoid hypertrophy. Visualized paranasal sinuses
and mastoids are clear.  No acute osseous abnormality identified.

Stable small triangular hyperdense area at the anterior aspect of
the third ventricle.  This does not appear round like a colloid
cyst.  May be related to choroid plexus calcification.  The
suggestion of hyperdensity at the occipital horns is no longer
seen.

Ventricular size and configuration are within normal limits.
Cerebral volume is normal.  No midline shift, mass effect, or
evidence of mass lesion.  Basilar cisterns are patent. Gray-white
matter differentiation is within normal limits throughout the
brain.  No evidence of cortically based acute infarction
identified.  No acute intracranial hemorrhage identified.  No
suspicious intracranial vascular hyperdensity.
IMPRESSION: No acute intracranial abnormality.
Stable tiny hyperdense area at the anterior third ventricle, favor
incidental.

## 2010-11-19 IMAGING — CT CT HEAD W/O CM
1 series · 16 of 30 positions shown, 20 images · non-contrast
Comparison: None.

CLINICAL DATA: Persistent headache for 2 months.  Right-sided
numbness.  Follow-up subarachnoid hemorrhage.

CT HEAD WITHOUT CONTRAST
TECHNIQUE: Contiguous axial images were obtained from the base of
the skull through the vertex without contrast.

[Series 2: head routine 4.8 h37s · axial · 0.43mm/px · z∈[-148,-20]mm · 16 of 30 slices shown, 20 images]
[im 2/30  brain]
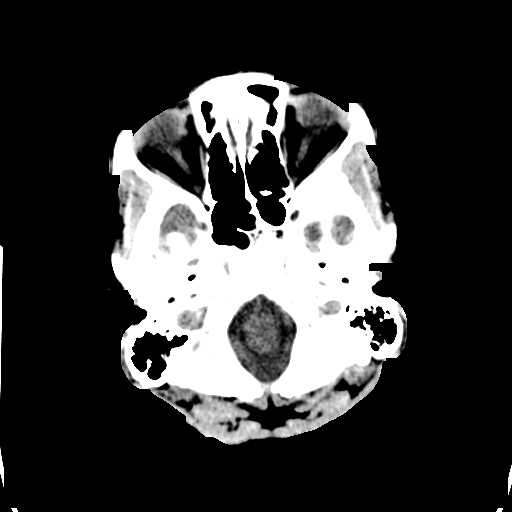
[im 2/30  bone]
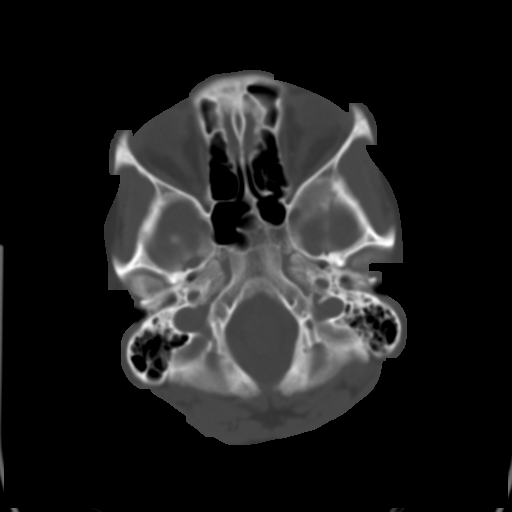
[im 4/30  brain]
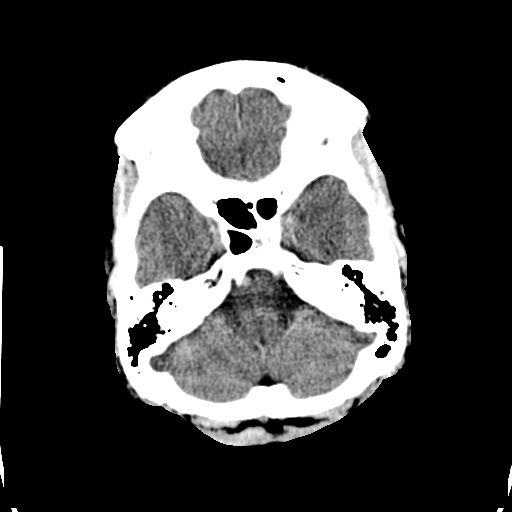
[im 6/30  brain]
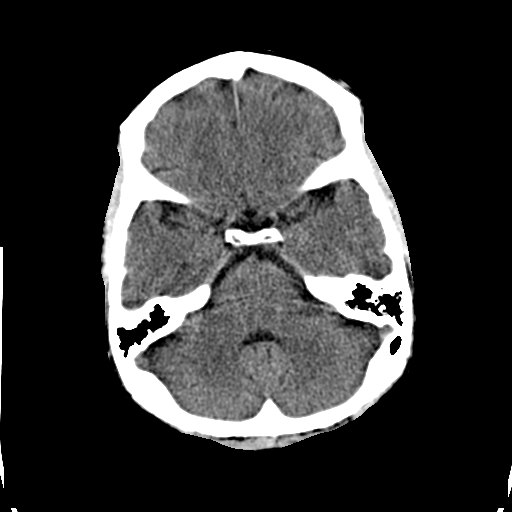
[im 8/30  brain]
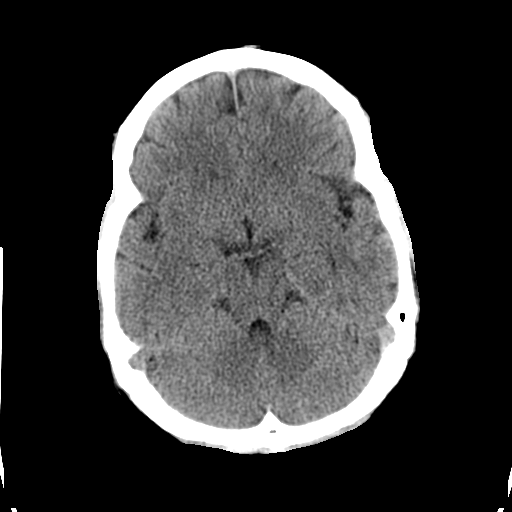
[im 9/30  brain]
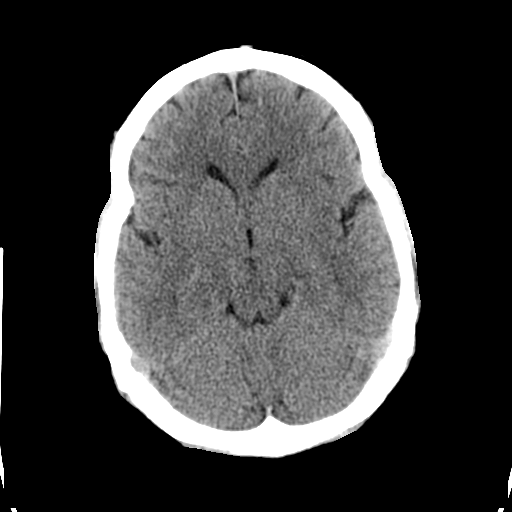
[im 9/30  bone]
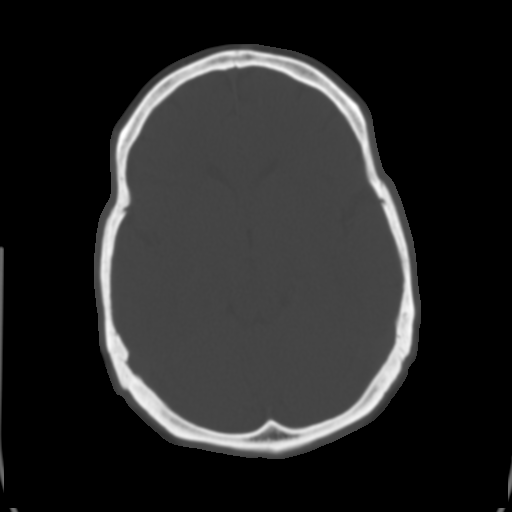
[im 11/30  brain]
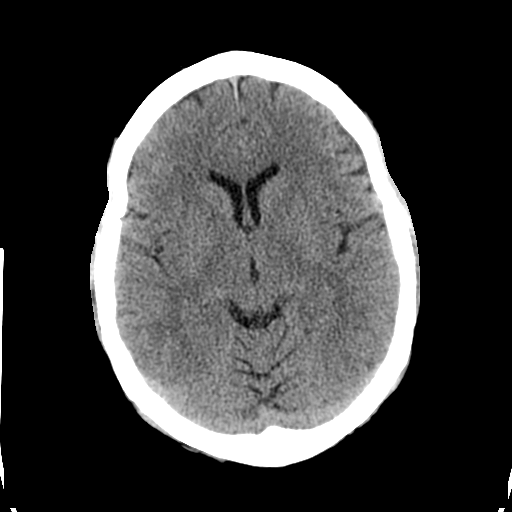
[im 13/30  brain]
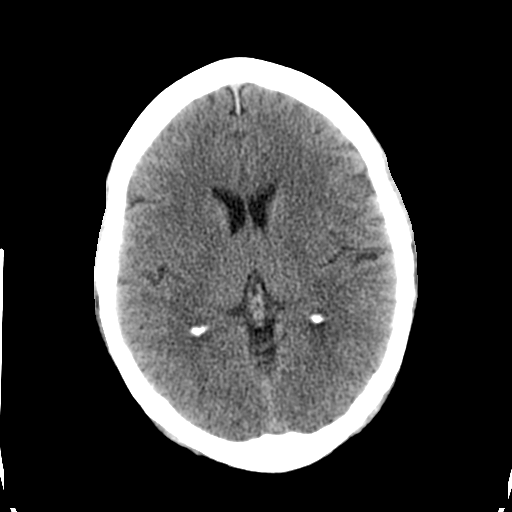
[im 15/30  brain]
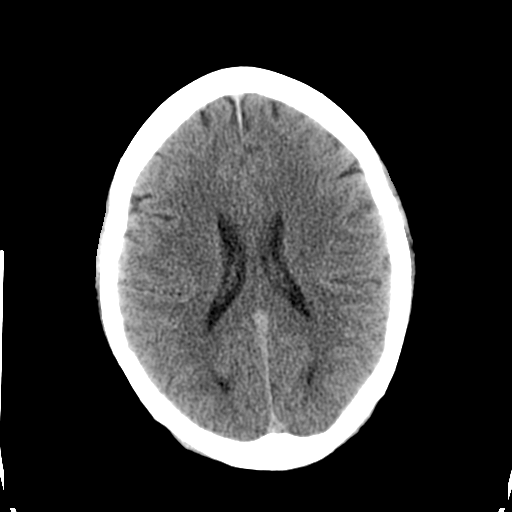
[im 16/30  brain]
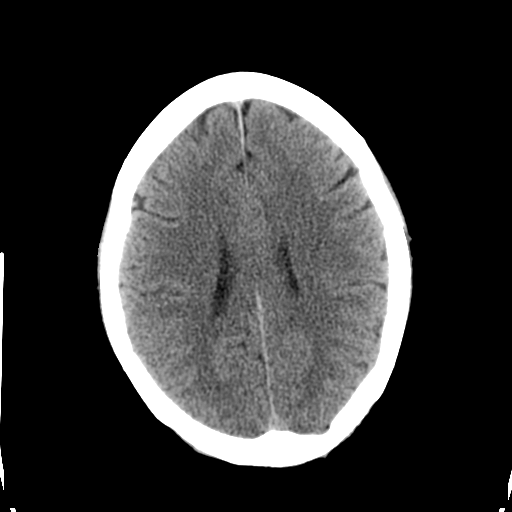
[im 16/30  bone]
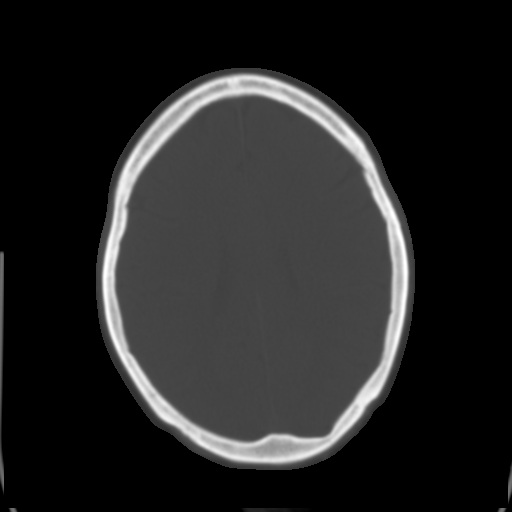
[im 18/30  brain]
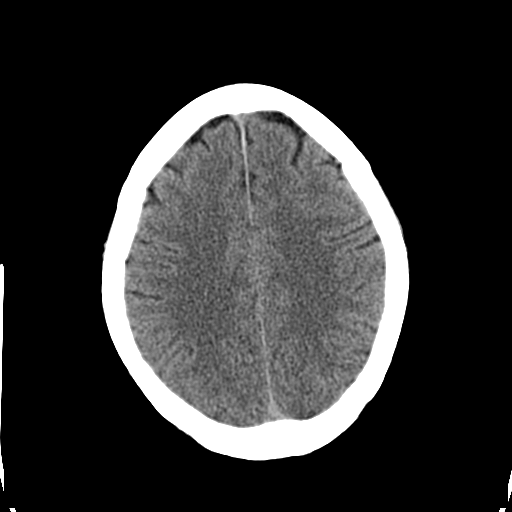
[im 20/30  brain]
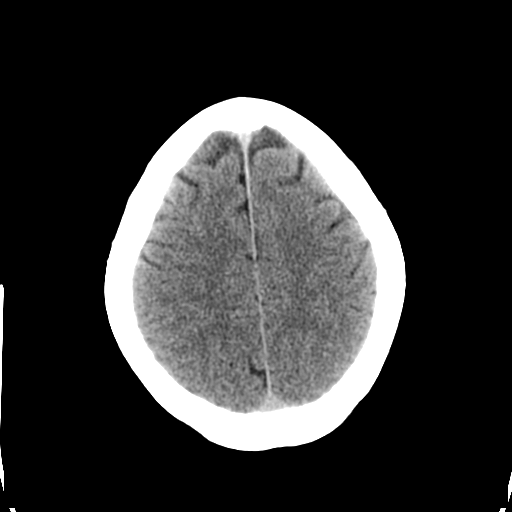
[im 22/30  brain]
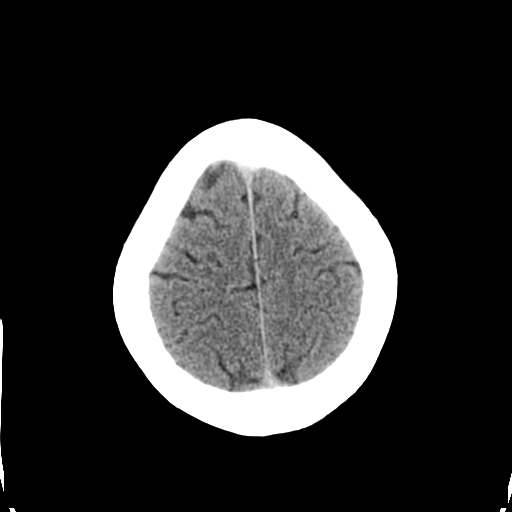
[im 23/30  brain]
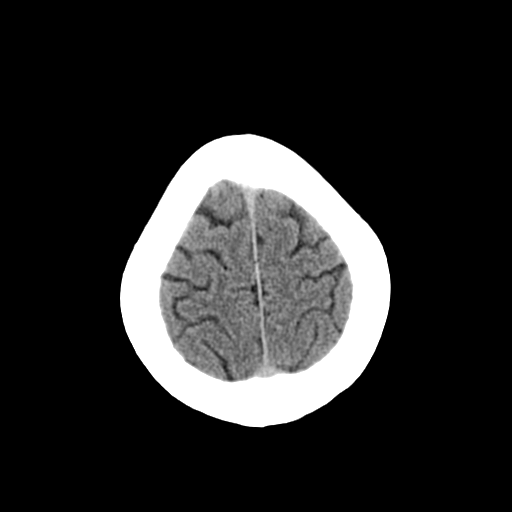
[im 23/30  bone]
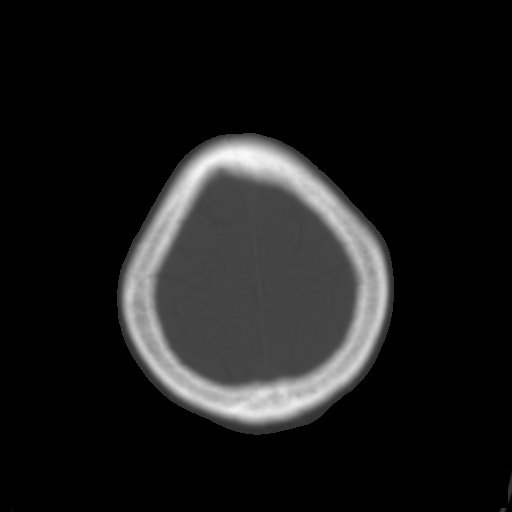
[im 25/30  brain]
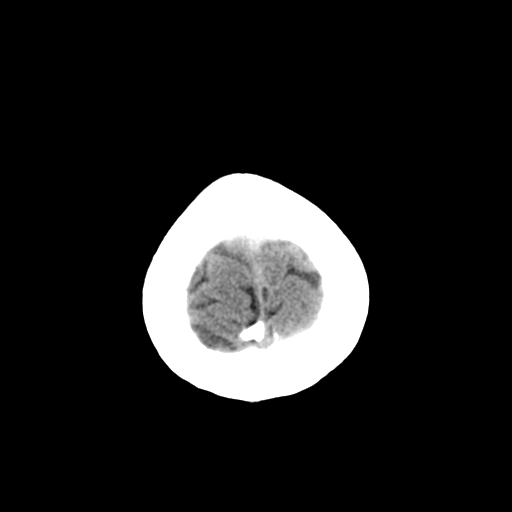
[im 27/30  brain]
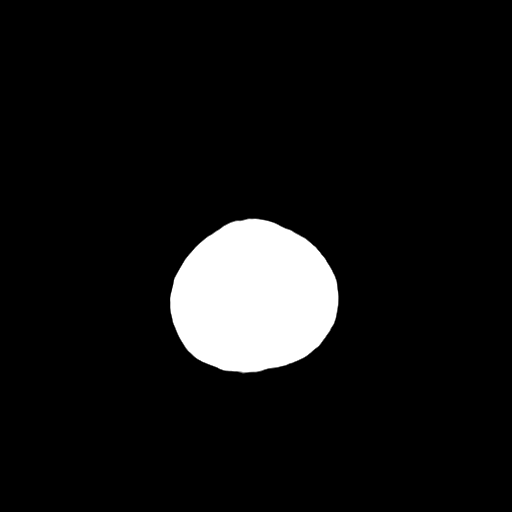
[im 29/30  brain]
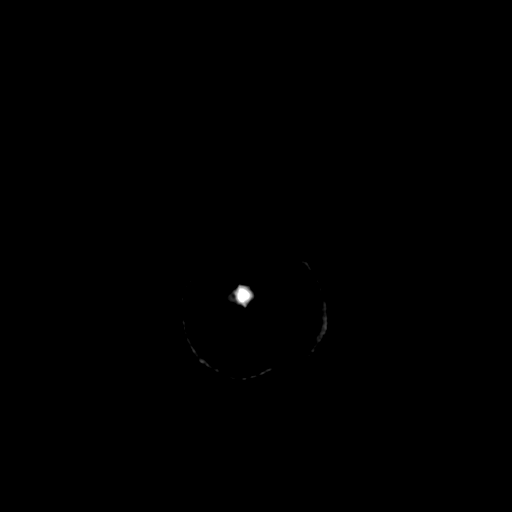

[16 of 30 positions shown; findings below may reference images not displayed]

FINDINGS: Redemonstrated is a tiny focus of hyperdensity in the
region of the anterior third ventricle, unchanged from 12/11/2009.
No layering hyperdensities in the occipital horns as was previously
demonstrated.  No subarachnoid or subdural hemorrhage.

No evidence for acute stroke, hydrocephalus, extra-axial fluid, or
mass lesion.  No midline shift.  Calvarium intact. Sinuses and
mastoids clear.
IMPRESSION: Tiny focus of hyperdensity anterior third ventricle (image 12,
series 2) of uncertain significance.  Given its interval stability
over 6 weeks, this likely represents a nonpathologic finding, such
as calcification of the choroid plexus.  No visible acute
intracranial findings or specifically intracranial hemorrhage.

## 2010-12-11 LAB — GLUCOSE, CAPILLARY: Glucose-Capillary: 80 mg/dL (ref 70–99)

## 2010-12-18 LAB — GLUCOSE, CAPILLARY: Glucose-Capillary: 77 mg/dL (ref 70–99)

## 2010-12-22 LAB — BASIC METABOLIC PANEL
BUN: 11 mg/dL (ref 6–23)
CO2: 28 mEq/L (ref 19–32)
Calcium: 9 mg/dL (ref 8.4–10.5)
Chloride: 101 mEq/L (ref 96–112)
Creatinine, Ser: 0.72 mg/dL (ref 0.4–1.2)
GFR calc Af Amer: >60 mL/min (ref >60)
GFR calc non Af Amer: >60 mL/min (ref >60)
Glucose, Bld: 85 mg/dL (ref 70–99)
Potassium: 3.7 mEq/L (ref 3.5–5.1)
Sodium: 136 mEq/L (ref 135–145)

## 2010-12-22 LAB — CBC
HCT: 41.7 % (ref 36.0–46.0)
Hemoglobin: 13.7 g/dL (ref 12.0–15.0)
MCHC: 32.8 g/dL (ref 30.0–36.0)
MCV: 86 fL (ref 78.0–100.0)
Platelets: 257 10*3/uL (ref 150–400)
RBC: 4.85 MIL/uL (ref 3.87–5.11)
RDW: 13.6 % (ref 11.5–15.5)
WBC: 10.5 10*3/uL (ref 4.0–10.5)

## 2010-12-22 LAB — GLUCOSE, CAPILLARY
Glucose-Capillary: 111 mg/dL — ABNORMAL HIGH (ref 70–99)
Glucose-Capillary: 135 mg/dL — ABNORMAL HIGH (ref 70–99)
Glucose-Capillary: 73 mg/dL (ref 70–99)

## 2010-12-22 LAB — MRSA PCR SCREENING

## 2010-12-23 LAB — DIFFERENTIAL
Basophils Absolute: 0 10*3/uL (ref 0.0–0.1)
Basophils Relative: 0 % (ref 0–1)
Eosinophils Absolute: 0.1 10*3/uL (ref 0.0–0.7)
Eosinophils Relative: 1 % (ref 0–5)
Lymphocytes Relative: 39 % (ref 12–46)
Lymphs Abs: 3.8 10*3/uL (ref 0.7–4.0)
Monocytes Absolute: 0.7 10*3/uL (ref 0.1–1.0)
Monocytes Relative: 7 % (ref 3–12)
Neutro Abs: 5.3 10*3/uL (ref 1.7–7.7)
Neutrophils Relative %: 53 % (ref 43–77)

## 2010-12-23 LAB — URINALYSIS, ROUTINE W REFLEX MICROSCOPIC
Bilirubin Urine: NEGATIVE
Glucose, UA: NEGATIVE mg/dL
Hgb urine dipstick: NEGATIVE
Ketones, ur: NEGATIVE mg/dL
Nitrite: NEGATIVE
Protein, ur: NEGATIVE mg/dL
Specific Gravity, Urine: 1.016 (ref 1.005–1.030)
Urobilinogen, UA: 0.2 mg/dL (ref 0.0–1.0)
pH: 7 (ref 5.0–8.0)

## 2010-12-23 LAB — PROTIME-INR
INR: 0.99 (ref 0.00–1.49)
Prothrombin Time: 13 seconds (ref 11.6–15.2)

## 2010-12-23 LAB — URINE MICROSCOPIC-ADD ON

## 2010-12-23 LAB — POCT I-STAT, CHEM 8
BUN: 18 mg/dL (ref 6–23)
Calcium, Ion: 1.22 mmol/L (ref 1.12–1.32)
Chloride: 102 mEq/L (ref 96–112)
Creatinine, Ser: 0.8 mg/dL (ref 0.4–1.2)
Glucose, Bld: 133 mg/dL — ABNORMAL HIGH (ref 70–99)
HCT: 45 % (ref 36.0–46.0)
Hemoglobin: 15.3 g/dL — ABNORMAL HIGH (ref 12.0–15.0)
Potassium: 3.4 mEq/L — ABNORMAL LOW (ref 3.5–5.1)
Sodium: 138 mEq/L (ref 135–145)
TCO2: 32 mmol/L (ref 0–100)

## 2010-12-23 LAB — TYPE AND SCREEN
ABO/RH(D): A POS
Antibody Screen: NEGATIVE

## 2010-12-23 LAB — CBC
HCT: 42.7 % (ref 36.0–46.0)
Hemoglobin: 13.9 g/dL (ref 12.0–15.0)
MCHC: 32.4 g/dL (ref 30.0–36.0)
MCV: 85.3 fL (ref 78.0–100.0)
Platelets: 292 10*3/uL (ref 150–400)
RBC: 5.01 MIL/uL (ref 3.87–5.11)
RDW: 13.6 % (ref 11.5–15.5)
WBC: 9.8 10*3/uL (ref 4.0–10.5)

## 2010-12-23 LAB — ABO/RH: ABO/RH(D): A POS

## 2010-12-23 LAB — GLUCOSE, CAPILLARY: Glucose-Capillary: 98 mg/dL (ref 70–99)

## 2010-12-30 LAB — GLUCOSE, CAPILLARY: Glucose-Capillary: 100 mg/dL — ABNORMAL HIGH (ref 70–99)

## 2011-01-06 LAB — POCT URINALYSIS DIP (DEVICE)
Bilirubin Urine: NEGATIVE
Glucose, UA: NEGATIVE mg/dL
Hgb urine dipstick: NEGATIVE
Ketones, ur: NEGATIVE mg/dL
Nitrite: NEGATIVE
Protein, ur: NEGATIVE mg/dL
Specific Gravity, Urine: 1.015 (ref 1.005–1.030)
Urobilinogen, UA: 0.2 mg/dL (ref 0.0–1.0)
pH: 5.5 (ref 5.0–8.0)

## 2011-02-11 NOTE — Group Therapy Note (Signed)
NAMECOURTLAND, Morgan Davenport NO.:  [id number]   MEDICAL RECORD NO.:  [id number]          PATIENT TYPE:  WOC   LOCATION:  [facility]                   FACILITY:  [facility]   PHYSICIAN:  Sid Falcon, CNM  DATE OF BIRTH:  1961-02-09   DATE OF SERVICE:  03/23/2009                                  CLINIC NOTE   REASON FOR VISIT:  Followup visit status post an ultrasound on March 05, 2009.  The patient was reporting continued lower pelvic pain despite  having a total abdominal hysterectomy and bilateral salpingo-  oophorectomy in 2008, was concerned and wanted to make sure there was  nothing there.  An ultrasound was completed and results negative.  The  patient was given results, expressed relief.  No other problems at this  time.  We will follow up as needed.      Sid Falcon, CNM     WM/MEDQ  D:  03/23/2009  T:  03/24/2009  Job:  [id number]

## 2011-02-11 NOTE — Group Therapy Note (Signed)
NAMEMarland Kitchen  FARRAH, SKODA NO.:  [id number]   MEDICAL RECORD NO.:  [id number]          PATIENT TYPE:  WOC   LOCATION:  [facility]                   FACILITY:  [facility]   PHYSICIAN:  Elsie Lincoln, MD      DATE OF BIRTH:  01-Aug-1961   DATE OF SERVICE:  06/18/2007                                  CLINIC NOTE   The patient is a 50 year old female who presents for followup of  endometrial biopsy.  The patient was seen 2 weeks ago with complaints of  fibroids and pain.  She was sent from [facility].  Endometrial biopsy  showed insufficient tissue for diagnosis.  The patient does have a  significant uterus that measures 10x13x9 with multiple fibroids.  The  patient was consented for a repeat endometrial biopsy given that she  does have lots of pain and bleeding.  We wanted to rule out endometrial  cancer before a hysterectomy.  The patient was counseled on all of her  options.  She is not a candidate for oral contraceptives given her  smoking history.  She is not interested in Depo.  She does not have  insurance, so uterine artery embolization is not possible in this health  system.  Waiting until menopause is not an option for her as well.  Her  uterus is also a candidate for Mirena.  However, she does not think that  this will help her pain.  She just wants a hysterectomy and be done.  The patient was told that the risk of a hysterectomy are bleeding,  infection, damage to intraabdominal organs.  There is also the risk of  DVT and pulmonary embolism after the operation.   PAST MEDICAL HISTORY:  The patient reluctantly admits to hepatitis B and  hepatitis C, and anxiety disorder.   PAST SURGICAL HISTORY:  Denies.   OB HISTORY:  NSVD x1.   GYN HISTORY:  Chlamydia many years ago.  No history of abnormal Pap  smears.  We did get her last Pap smear from [facility].   MEDICATIONS:  Xanax.   ALLERGIES:  Denies.   SOCIAL HISTORY:  The patient has been a prostitute in the  past.  She  drinks at least 40 ounces of beer a day.  She does sometimes drink in  the morning.  I am worried that she does have alcohol dependency.  She  has smoked crack in the past, but none recently.  She does smoke  marijuana.  She does not do IV drugs.  I do believe a urine drug screen  would be relevant prior to initiation of anesthesias, and also a blood  alcohol level.   PHYSICAL EXAM:  Temperature 97.2, pulse 92, blood pressure 142/85,  weight 113.6, which is 61.5 kg.  GENERAL:  Slightly under-weight.  No distress.  HEENT:  Large seborrheic keratosis under the right eye.  Respiratory effort is good.  ABDOMEN:  Soft, nontender, nondistended.  Can palpate the uterus through  the abdominal wall.  GENITALIA:  Tanner V.  Vaginal, small amount of blood as the patient is  ending her menses.  Cervix is  closed, nontender.  Uterus 16-week size.  Uterus sounds to 10 cm.  Bulky, mildly tender diffusely.  EXTREMITIES:  No clubbing or edema.   ASSESSMENT AND PLAN:  1. A 50 year old female with fibroid uterus, menorrhagia, wanting      definitive treatment.  The patient counseled for TAH/BSO.  She      understand she will be in menopause and does not want hormonal      treatment right away.  She wants to see how bad the hot flashes      are.  She has a simple ovarian cyst on the left ovary, which will      be definitively treated with the hysterectomy and BSO.  Repeated      endometrial biopsy today, which will come back.  Hopefully we have      enough tissue with this sample.  2. We will schedule the hysterectomy.  3. We drew hepatitis B, hepatitis C, CMP, and coags today.  4. Will notify anesthesia of her substance abuse issues.  5. Pap smear from [facility].           ______________________________  Elsie Lincoln, MD     KL/MEDQ  D:  06/18/2007  T:  06/18/2007  Job:  [id number]

## 2011-02-11 NOTE — Discharge Summary (Signed)
[name], Morgan Davenport               ACCOUNT NO.:  [id number]   MEDICAL RECORD NO.:  [id number]          PATIENT TYPE:  INP   LOCATION:  9320                          FACILITY:  [facility]   PHYSICIAN:  Ginger Carne, MD  DATE OF BIRTH:  January 27, 1961   DATE OF ADMISSION:  09/21/2007  DATE OF DISCHARGE:  09/23/2007                               DISCHARGE SUMMARY   REASON FOR HOSPITALIZATION:  1. Leiomyomatous uterus.  2. Pelvic pain.  3. Dysmenorrhea.   IN-HOSPITAL PROCEDURES:  Total abdominal hysterectomy and bilateral  salpingo-oophorectomy.   FINAL DIAGNOSES:  1. Leiomyomatous uterus.  2. Pelvic pain.  3. Dysmenorrhea.   HOSPITAL COURSE:  This patient is a 50 year old, African-American female  who underwent the aforementioned procedure on 09/21/2007.  Her  intraoperative course was uneventful.  Postoperatively, the patient was  noted to have a drop in her hemoglobin from a preop value of 11.3 to 8.3  on the morning of 09/22/2007.  Follow-up in the afternoon revealed a  hemoglobin of 7.8.  Her vital signs were stable.  Her abdomen was +1  distended.  A repeat hemoglobin value on the morning of discharge was  8.6.  Her abdomen was +1 distended with excellent bowel sounds.  She  received a Fleet enema with minimal results on the day prior to  discharge.  This included two Dulcolax suppositories.  Her calves were  without tenderness.  Lungs were clear.  Her incision was dry.  In view  of her excellent bowel sounds and slight distention, it was determined  that the patient has an effect from narcotic analgesics causing a  diminished motility of said bowel.   The patient was encouraged to switch to ibuprofen three over-the-counter  tablets up to four times a day instead of Percocet 5/325 which she was  discharged with one to two every 4-6 hours, to minimize narcotic effects  on bowel.  She was provided routine instructions including contacting  the office for temperature elevation  above 100.4 degrees Fahrenheit,  increasing abdominal or incisional pain, incisional drainage, vaginal  bleeding.  The patient was also advised to return to the hospital if she  does not have a bowel movement by 09/26/2007 or 09/27/2007.  The patient  will return to the GYN Clinic to have her staples removed in five or six  days.   DISCHARGE MEDICATIONS:  1. Ibuprofen three tablets every six hours (over-the-counter) as      needed for pain.  2. Percocet 5/325 one to two every 4-6 hours.   All questions were answered to the satisfaction of patient.  The patient  verbalized understand of same.      Ginger Carne, MD  Electronically Signed     SHB/MEDQ  D:  09/23/2007  T:  09/23/2007  Job:  [id number]

## 2011-02-11 NOTE — Group Therapy Note (Signed)
NAME:  Morgan Davenport, Morgan Davenport NO.:  [id number]   MEDICAL RECORD NO.:  [id number]          PATIENT TYPE:  WOC   LOCATION:  [facility]                   FACILITY:  [facility]   PHYSICIAN:  Maylon Cos, CNM    DATE OF BIRTH:  1960/12/19   DATE OF SERVICE:  03/02/2009                                  CLINIC NOTE   Morgan Davenport is a 50 year old African American female being seen today in  the GYN Clinic at the [facility].  She has complained of vaginal  pressure that began about 6 months ago.  She states that approximately 6  months ago, she began having right lower quadrant pain only with  urination that she describes as a pulling sensation and pressure.  She  denies any other abdominal pain with urination, any burning with  urination, any frequency.  Her other complaint today is left upper  quadrant pain that became less than 6 months ago.  She described this as  a dull aching pain that will last for 10-15 minutes, that comes and  goes, it resolves in its own, and does not seem to be dependent on  activity or position.  She is also concerned about swelling in her  abdomen after she eats that is relieved several hours after eating, she  is having regular bowel movements and she is passing gas on a regular  basis.   INTERVAL HISTORY:  The patient had a TAH-BSO in December 2008.  Pathology of that surgery is benign and does confirm complete and total  hysterectomy including the cervix, uterus and bilateral ovaries and  fallopian tubes.  The patient's problem list includes hepatitis A and B,  which she is being followed at [facility] and anxiety which is also  being followed by [facility].   On examination today, Morgan Davenport is a pleasant 50 year old African  American female who appears to be slightly older than her stated age.  Her vital signs are stable.  Her blood pressure is 124/79.  Her weight  is 125.  Her examination today is problem focused.  Her abdomen is soft  and nontender, does not appear distended.  She has no rebound  tenderness.  No guarding.  She has positive bowel sounds in all 4  quadrants.  She does have a vertical skin incision from a hysterectomy.  Pelvic; genitalia, she is Tanner V.  Mucous membranes are pink without  lesions.  She has a scant amount of creamy discharge that is nonodorous.  She has a regular rate and good tone.  She has a grade 2 cystocele and  grade 1 rectocele.  On bimanual exam; her suture line is intact, cuff is  intact.  There is no pain with palpation of the cuff; however, the  patient complains of being able to reproduce the pain with palpation in  the lower right quadrant in the area of where the fallopian tubes would  be.  She states that reproduces the pain in sensation that she feels  with urination.   ASSESSMENT AND PLAN:  Abdominal pain and pelvic pain, status post  hysterectomy.   PLAN:  UA  is negative.  Urine C and S was sent.  We will obtain a pelvic  ultrasound.  The patient will follow up in 2-3 weeks following her  pelvic ultrasound review results.  The patient has multiple visits to  the ER in which pelvic CT, abdominal CT, and KUB have all been performed  and been found to be negative.  The patient is reassured by  these negative findings.  She is encouraged to follow up with  [facility] for her complaints of upper abdominal pain and continued  management of anxiety and hepatitis.           ______________________________  Maylon Cos, CNM     SS/MEDQ  D:  03/02/2009  T:  03/03/2009  Job:  [id number]

## 2011-02-11 NOTE — Op Note (Signed)
Morgan Davenport, Morgan Davenport NO.:  [id number]   MEDICAL RECORD NO.:  [id number]          PATIENT TYPE:  INP   LOCATION:  9320                          [facility]ACILITY:  [facility]   PHYSICIAN:  Lesly Dukes, M.D. DATE OF BIRTH:  06-12-1961   DATE OF PROCEDURE:  09/21/2007  DATE OF DISCHARGE:                               OPERATIVE REPORT   PREOPERATIVE DIAGNOSIS:  A 50 year old female with myomatous uterus,  pelvic pain, and dysmenorrhea, wanting a hysterectomy.   POSTOPERATIVE DIAGNOSIS:  A 50 year old female with myomatous uterus,  pelvic pain, and dysmenorrhea, wanting a hysterectomy.   PROCEDURE:  Total abdominal hysterectomy, bilateral salpingo-  oophorectomy, and lysis of adhesions.   SURGEON:  Lesly Dukes, M.D.   ASSISTANTMichele Mcalpine D. Okey Dupre, M.D.   ANESTHESIA:  Spinal.   FINDINGS:  An 18-week sized uterus with multiple fibroids.  Patient has  copious amounts of filmy adhesions involving the fallopian tubes,  ovaries, posterior peritoneum, and posterior surface of the uterus.   PATHOLOGY:  Uterus, ovaries, fallopian tubes, and cervix to the  pathology department.   ESTIMATED BLOOD LOSS:  200.   COMPLICATIONS:  None.   PROCEDURE:  After informed consent was obtained, the patient was taken  to the operating room, where spinal anesthesia was initiated and found  to be adequate.  The patient was placed in dorsal supine position and  prepped and draped in a normal sterile fashion.  A Foley was in the  bladder.  A vertical skin incision was made with a scalpel and carried  down to the fascia.  The fascia was incised vertically, and the incision  was extended both superiorly and inferiorly.  The rectus muscle were  separated in the midline.  The peritoneum was entered sharply.  The  O'Connor-O'Sullivan retractor was placed into the abdomen, and the bowel  was packed away safely.  The uterus was brought into the surgical field.  The round ligaments were isolated,  clamped, transected, and suture  ligated with 0 Vicryl.  The bladder flap was then created by incising  the anterior leaf of the broad ligament.  Some adhesions were taken down  involving the ovaries and fallopian tubes, and the posterior surface of  the uterus at this time.  The utero-ovarian ligaments were then  isolated, clamped, transected, and suture-ligated with 0 Vicryl.  The  ovaries, although mobile, could not be visualized well at this time  secondary to the large uterus.  These were taken out after the uterus  was removed from the abdomen.  The uterine arteries were then  skeletonized, and more lysis of adhesions were done.  The uterine  arteries were clamped with Heaney clamps, transected, and suture ligated  with 0 Vicryl.  The uterus was then amputated at the cervical stump.  The cardinal ligaments and the uterosacral ligaments were clamped,  transected, and suture ligated with 0 Vicryl.  The vagina was then  entered and cut with Mayo scissors.  The cervix was handed off and  submitted to the pathology department.  The angles of the vagina were  suture ligated  with 0 Vicryl.  The vaginal cuff was closed with 0 Vicryl  in a running, locked fashion.  Good hemostasis was noted.  At this time,  the ovaries were then isolated.  Each ureter was found.  The  infundibulopelvic ligament was clamped, transected, and suture ligated  with 0 Vicryl x2.  Good hemostasis was noted from these pedicles.  The  peritoneal cavity is copiously irrigated, and all pedicles were found to  be hemostatic.  All instruments and laps were removed from the patient's  abdomen.  All counts were correct at this time.  The fascia was closed  with loop PDS in a running fashion, and good hemostasis was noted.  The  subcutaneous tissue was copiously irrigated, and the skin was closed  with staples.  The patient tolerated the procedure well.  Sponge, lap,  instrument, and needle count were correct x2, and patient  went to the  recovery room in stable condition.  A Foley was still in the bladder.      Lesly Dukes, M.D.  Electronically Signed     KHL/MEDQ  D:  09/21/2007  T:  09/21/2007  Job:  [id number]

## 2011-02-11 NOTE — Assessment & Plan Note (Signed)
OFFICE VISIT   Morgan Davenport, Morgan Davenport  DOB:  1960/12/11                                       02/18/2010  ZOXWR#:[id number]   REASON FOR VISIT:  Carotid stenosis.   HISTORY:  The patient is a 50 year old female who I am seeing for  evaluation of carotid stenosis.  The patient was recently in the  hospital for headaches.  She had a head CT that was concerning for  subarachnoid hemorrhage.  She underwent an arteriogram that did not show  any evidence of aneurysm.  The arteriogram did reveal a 35% to 40%  stenosis of the right internal carotid artery and 40% stenosis on the  left subclavian artery.  It was later felt that a head CT was more  consistent with an area of hypodensity and possibly calcification rather  than subarachnoid hemorrhage.  She is sent to me to further follow up on  her carotid disease.  The patient denies having any symptoms,  specifically no numbness or weakness in either extremity.  No amaurosis  fugax.  No slurring of her speech.   The patient suffers from hypertension which has been relatively well  controlled.  She also has a history of smoking, she is now down to just  2 cigarettes a day.   REVIEW OF SYSTEMS:  NEUROLOGIC:  Positive for severe headaches.  MUSCULOSKELETAL:  Positive for arthritis, joint pain and muscle pain.  PSYCHIATRIC:  Positive for depression.  ENT:  Recent change in hearing.   All other review of systems are negative as documented in the encounter  form.   PAST MEDICAL HISTORY:  Hypertension, rheumatoid arthritis, depression,  chronic headaches.   PAST SURGICAL HISTORY:  Hysterectomy in 2008.   FAMILY HISTORY:  Not available as the patient is adopted.   SOCIAL HISTORY:  She is single with 1 child.  She currently smokes 2  cigarettes a day.  Does not drink alcohol.   PHYSICAL EXAMINATION:  Vital signs:  Heart rate 71, blood pressure  123/77, temperature is 97.4.  General:  Well-appearing, no distress.  HEENT:  Within normal limits.  Lungs:  Clear bilaterally.  Cardiovascular:  Regular rate and rhythm.  No murmur.  No carotid  bruits.  Abdomen:  Soft, nontender.  Musculoskeletal:  Without major  deformity.  Neuro:  No focal weaknesses.  Skin:  Without rash.   ASSESSMENT:  Mild to moderate right carotid stenosis.   PLAN:  1. I reiterated to the patient today that her carotid disease is not      the etiology for her headaches.  I told her that her disease is      rather mild but it is something that we will need to follow over      time.  I have scheduled her to come back to see me in 2 years for      repeat carotid ultrasound.  2. Smoking cessation:  We discussed complete abstinence from tobacco.      I spent approximately 10 minutes discussing this with her.     Jorge Ny, MD  Electronically Signed   VWB/MEDQ  D:  02/18/2010  T:  02/19/2010  Job:  [id number]   cc:   Dala Dock

## 2011-03-03 ENCOUNTER — Other Ambulatory Visit: Payer: Self-pay | Admitting: Gastroenterology

## 2011-03-03 DIAGNOSIS — B182 Chronic viral hepatitis C: Secondary | ICD-10-CM

## 2011-03-07 ENCOUNTER — Other Ambulatory Visit: Payer: Self-pay | Admitting: Diagnostic Radiology

## 2011-03-07 ENCOUNTER — Other Ambulatory Visit (HOSPITAL_COMMUNITY): Payer: Self-pay | Admitting: Family Medicine

## 2011-03-07 ENCOUNTER — Ambulatory Visit (HOSPITAL_COMMUNITY)
Admission: RE | Admit: 2011-03-07 | Discharge: 2011-03-07 | Disposition: A | Discharge status: Home / Self Care | Payer: Self-pay | Source: Ambulatory Visit | Attending: Gastroenterology | Admitting: Gastroenterology

## 2011-03-07 DIAGNOSIS — Z01812 Encounter for preprocedural laboratory examination: Secondary | ICD-10-CM | POA: Insufficient documentation

## 2011-03-07 DIAGNOSIS — B182 Chronic viral hepatitis C: Secondary | ICD-10-CM

## 2011-03-07 DIAGNOSIS — Z1231 Encounter for screening mammogram for malignant neoplasm of breast: Secondary | ICD-10-CM

## 2011-03-07 DIAGNOSIS — B1920 Unspecified viral hepatitis C without hepatic coma: Secondary | ICD-10-CM | POA: Insufficient documentation

## 2011-03-07 LAB — CBC
HCT: 40.4 % (ref 36.0–46.0)
Hemoglobin: 13.3 g/dL (ref 12.0–15.0)
MCH: 27.4 pg (ref 26.0–34.0)
MCHC: 32.9 g/dL (ref 30.0–36.0)
MCV: 83.1 fL (ref 78.0–100.0)
Platelets: 288 10*3/uL (ref 150–400)
RBC: 4.86 MIL/uL (ref 3.87–5.11)
RDW: 13.3 % (ref 11.5–15.5)
WBC: 7.2 10*3/uL (ref 4.0–10.5)

## 2011-03-07 LAB — PROTIME-INR
INR: 0.94 (ref 0.00–1.49)
Prothrombin Time: 12.8 seconds (ref 11.6–15.2)

## 2011-03-07 LAB — GLUCOSE, CAPILLARY: Glucose-Capillary: 137 mg/dL — ABNORMAL HIGH (ref 70–99)

## 2011-03-07 LAB — APTT: aPTT: 34 seconds (ref 24–37)

## 2011-03-20 ENCOUNTER — Encounter: Payer: Self-pay | Admitting: Gastroenterology

## 2011-04-14 ENCOUNTER — Ambulatory Visit (HOSPITAL_COMMUNITY)
Admission: RE | Admit: 2011-04-14 | Discharge: 2011-04-14 | Disposition: A | Discharge status: Home / Self Care | Payer: Self-pay | Source: Ambulatory Visit | Attending: Family Medicine | Admitting: Family Medicine

## 2011-04-14 DIAGNOSIS — Z1231 Encounter for screening mammogram for malignant neoplasm of breast: Secondary | ICD-10-CM | POA: Insufficient documentation

## 2011-06-19 LAB — POCT URINALYSIS DIP (DEVICE)
Glucose, UA: 100 — AB
Ketones, ur: NEGATIVE
Nitrite: NEGATIVE
Operator id: 297281
Protein, ur: 100 — AB
Specific Gravity, Urine: 1.025
Urobilinogen, UA: 1
pH: 7

## 2011-06-30 LAB — CBC
HCT: 37.9
Hemoglobin: 12.5
MCHC: 33.1
MCV: 88.9
Platelets: 329
RBC: 4.26
RDW: 13.2
WBC: 10.1

## 2011-06-30 LAB — DIFFERENTIAL
Basophils Absolute: 0
Basophils Relative: 0
Eosinophils Absolute: 0
Eosinophils Relative: 1
Lymphocytes Relative: 26
Lymphs Abs: 2.6
Monocytes Absolute: 0.8
Monocytes Relative: 8
Neutro Abs: 6.5
Neutrophils Relative %: 65

## 2011-06-30 LAB — URINALYSIS, ROUTINE W REFLEX MICROSCOPIC
Bilirubin Urine: NEGATIVE
Glucose, UA: NEGATIVE
Hgb urine dipstick: NEGATIVE
Ketones, ur: NEGATIVE
Nitrite: NEGATIVE
Protein, ur: NEGATIVE
Specific Gravity, Urine: 1.017
Urobilinogen, UA: 0.2
pH: 5.5

## 2011-06-30 LAB — COMPREHENSIVE METABOLIC PANEL
ALT: 70 — ABNORMAL HIGH
AST: 56 — ABNORMAL HIGH
Albumin: 3.8
Alkaline Phosphatase: 98
BUN: 11
CO2: 31
Calcium: 9.8
Chloride: 104
Creatinine, Ser: 0.89
GFR calc Af Amer: >60
GFR calc non Af Amer: >60
Glucose, Bld: 121 — ABNORMAL HIGH
Potassium: 3.5
Sodium: 142
Total Bilirubin: 0.6
Total Protein: 7

## 2011-06-30 LAB — URINE MICROSCOPIC-ADD ON

## 2011-06-30 LAB — LIPASE, BLOOD: Lipase: 29

## 2011-07-04 LAB — CBC
HCT: 23.2 — ABNORMAL LOW
HCT: 23.7 — ABNORMAL LOW
HCT: 25.5 — ABNORMAL LOW
HCT: 34.3 — ABNORMAL LOW
Hemoglobin: 11.3 — ABNORMAL LOW
Hemoglobin: 7.8 — CL
Hemoglobin: 7.8 — CL
Hemoglobin: 8.3 — ABNORMAL LOW
MCHC: 32.6
MCHC: 32.9
MCHC: 33.1
MCHC: 33.8
MCV: 77.9 — ABNORMAL LOW
MCV: 78.3
MCV: 78.5
MCV: 79.4
Platelets: 295
Platelets: 297
Platelets: 329
Platelets: 426 — ABNORMAL HIGH
RBC: 2.98 — ABNORMAL LOW
RBC: 3.02 — ABNORMAL LOW
RBC: 3.22 — ABNORMAL LOW
RBC: 4.38
RDW: 17.4 — ABNORMAL HIGH
RDW: 17.5 — ABNORMAL HIGH
RDW: 17.8 — ABNORMAL HIGH
RDW: 17.8 — ABNORMAL HIGH
WBC: 15.1 — ABNORMAL HIGH
WBC: 15.1 — ABNORMAL HIGH
WBC: 18.9 — ABNORMAL HIGH
WBC: 7.7

## 2011-07-04 LAB — RAPID URINE DRUG SCREEN, HOSP PERFORMED
Amphetamines: NOT DETECTED
Barbiturates: NOT DETECTED
Benzodiazepines: NOT DETECTED
Cocaine: NOT DETECTED
Opiates: NOT DETECTED
Tetrahydrocannabinol: NOT DETECTED

## 2011-07-04 LAB — HEMOGLOBIN AND HEMATOCRIT, BLOOD
HCT: 26.4 — ABNORMAL LOW
Hemoglobin: 8.6 — ABNORMAL LOW

## 2011-07-08 LAB — COMPREHENSIVE METABOLIC PANEL
ALT: 71 — ABNORMAL HIGH
AST: 78 — ABNORMAL HIGH
Albumin: 3.6
Alkaline Phosphatase: 58
BUN: 10
CO2: 29
Calcium: 9.1
Chloride: 100
Creatinine, Ser: 0.77
GFR calc Af Amer: >60
GFR calc non Af Amer: >60
Glucose, Bld: 103 — ABNORMAL HIGH
Potassium: 4.1
Sodium: 134 — ABNORMAL LOW
Total Bilirubin: 0.5
Total Protein: 7.1

## 2011-07-08 LAB — CBC
HCT: 33.3 — ABNORMAL LOW
Hemoglobin: 11.2 — ABNORMAL LOW
MCHC: 33.7
MCV: 79.3
Platelets: 401 — ABNORMAL HIGH
RBC: 4.2
RDW: 18.6 — ABNORMAL HIGH
WBC: 8.3

## 2011-07-08 LAB — RAPID URINE DRUG SCREEN, HOSP PERFORMED
Amphetamines: NOT DETECTED
Barbiturates: NOT DETECTED
Benzodiazepines: NOT DETECTED
Cocaine: POSITIVE — AB
Opiates: NOT DETECTED
Tetrahydrocannabinol: NOT DETECTED

## 2011-07-08 LAB — ETHANOL: Alcohol, Ethyl (B): <5

## 2011-07-11 LAB — POCT PREGNANCY, URINE
Operator id: 297281
Preg Test, Ur: NEGATIVE

## 2011-11-21 ENCOUNTER — Emergency Department (HOSPITAL_COMMUNITY)
Admission: EM | Admit: 2011-11-21 | Discharge: 2011-11-21 | Disposition: A | Discharge status: Home / Self Care | Payer: Self-pay | Attending: Emergency Medicine | Admitting: Emergency Medicine

## 2011-11-21 ENCOUNTER — Encounter (HOSPITAL_COMMUNITY): Payer: Self-pay | Admitting: *Deleted

## 2011-11-21 DIAGNOSIS — K089 Disorder of teeth and supporting structures, unspecified: Secondary | ICD-10-CM | POA: Insufficient documentation

## 2011-11-21 DIAGNOSIS — K0889 Other specified disorders of teeth and supporting structures: Secondary | ICD-10-CM

## 2011-11-21 HISTORY — DX: Essential (primary) hypertension: I10

## 2011-11-21 MED ORDER — PENICILLIN V POTASSIUM 500 MG PO TABS: 500.0000 mg | ORAL_TABLET | Freq: Four times a day (QID) | ORAL | Status: AC

## 2011-11-21 MED ORDER — HYDROCODONE-ACETAMINOPHEN 5-325 MG PO TABS: 2.0000 | ORAL_TABLET | ORAL | Status: DC | PRN

## 2011-11-21 MED ORDER — HYDROCODONE-ACETAMINOPHEN 5-325 MG PO TABS: 1.0000 | ORAL_TABLET | ORAL | Status: AC | PRN

## 2011-11-21 NOTE — ED Notes (Signed)
Pt reports sore throat, headache, gum pain and generalized body aches x 2 days. Worried about an abscess to tooth, left lower part of mouth.

## 2011-11-21 NOTE — ED Provider Notes (Signed)
History     CSN: [id number]  Arrival date & time 11/21/11  1435   First MD Initiated Contact with Patient 11/21/11 1523      Chief Complaint  Patient presents with  . Sore Throat  . Dental Pain  . Generalized Body Aches  . Headache    (Consider location/radiation/quality/duration/timing/severity/associated sxs/prior treatment) HPI  Patient presents to the emergency department with a dental complaint. Symptoms began yesterday. The patient has tried to alleviate pain with Ibuprofen.  Pain rated at a 10/10, characterized as throbbing in nature and located left lower molar. Patient denies fever, night sweats, chills, difficulty swallowing or opening mouth, SOB, nuchal rigidity or decreased ROM of neck.  Patient does not have a dentist and requests a resource guide at discharge.   Past Medical History  Diagnosis Date  . Hypertension   . Diabetes mellitus     Past Surgical History  Procedure Date  . Abdominal hysterectomy     No family history on file.  History  Substance Use Topics  . Smoking status: Current Everyday Smoker -- 0.5 packs/day    Types: Cigarettes  . Smokeless tobacco: Not on file  . Alcohol Use: No    OB History    Grav Para Term Preterm Abortions TAB SAB Ect Mult Living                  Review of Systems  All other systems reviewed and are negative.    Allergies  Review of patient's allergies indicates not on file.  Home Medications   Current Outpatient Rx  Name Route Sig Dispense Refill  . HYDROCODONE-ACETAMINOPHEN 5-325 MG PO TABS Oral Take 2 tablets by mouth every 4 (four) hours as needed for pain. 6 tablet 0  . PENICILLIN V POTASSIUM 500 MG PO TABS Oral Take 1 tablet (500 mg total) by mouth 4 (four) times daily. 40 tablet 0    BP 125/78  Pulse 71  Temp(Src) 98.2 F (36.8 C) (Oral)  Resp 16  SpO2 100%  Physical Exam  Nursing note and vitals reviewed. Constitutional: She appears well-developed and well-nourished. No distress.    HENT:  Head: Normocephalic and atraumatic.  Mouth/Throat: Uvula is midline, oropharynx is clear and moist and mucous membranes are normal. Normal dentition. Dental caries (Pts tooth shows no obvious abscess but moderate to severe tenderness to palpation of marked tooth) present. No uvula swelling.    Eyes: Pupils are equal, round, and reactive to light.  Neck: Trachea normal, normal range of motion and full passive range of motion without pain. Neck supple.  Cardiovascular: Normal rate, regular rhythm, normal heart sounds and normal pulses.   Pulmonary/Chest: Effort normal and breath sounds normal. No respiratory distress. Chest wall is not dull to percussion. She exhibits no tenderness, no crepitus, no edema, no deformity and no retraction.  Abdominal: Normal appearance.  Musculoskeletal: Normal range of motion.  Neurological: She is alert. She has normal strength.  Skin: Skin is warm, dry and intact. She is not diaphoretic.  Psychiatric: She has a normal mood and affect. Her speech is normal. Cognition and memory are normal.    ED Course  Procedures (including critical care time)  Labs Reviewed - No data to display No results found.   1. Toothache       MDM  Pt given prescription for Penicillin 500mg  and Lortab 5-325 (6 tabs)        Dorthula Matas, PA 11/21/11 1535

## 2011-11-21 NOTE — Discharge Instructions (Signed)

## 2011-11-22 NOTE — ED Provider Notes (Signed)
Medical screening examination/treatment/procedure(s) were performed by non-physician practitioner and as supervising physician I was immediately available for consultation/collaboration.  Zanayah Shadowens T Margarie Mcguirt, MD 11/22/11 2026 

## 2011-12-09 ENCOUNTER — Telehealth: Payer: Self-pay

## 2012-01-15 ENCOUNTER — Ambulatory Visit: Payer: Self-pay | Admitting: Gastroenterology

## 2012-01-22 ENCOUNTER — Other Ambulatory Visit: Payer: Self-pay | Admitting: *Deleted

## 2012-01-22 DIAGNOSIS — I6529 Occlusion and stenosis of unspecified carotid artery: Secondary | ICD-10-CM

## 2012-01-28 ENCOUNTER — Other Ambulatory Visit: Payer: Self-pay

## 2012-02-10 ENCOUNTER — Ambulatory Visit (INDEPENDENT_AMBULATORY_CARE_PROVIDER_SITE_OTHER): Payer: Self-pay | Admitting: Vascular Surgery

## 2012-02-10 DIAGNOSIS — I6529 Occlusion and stenosis of unspecified carotid artery: Secondary | ICD-10-CM

## 2012-02-10 NOTE — Progress Notes (Signed)
Carotid duplex performed @ [facility] 02/10/2012

## 2012-02-16 ENCOUNTER — Other Ambulatory Visit: Payer: Self-pay | Admitting: *Deleted

## 2012-02-16 ENCOUNTER — Encounter: Payer: Self-pay | Admitting: Surgery

## 2012-02-16 DIAGNOSIS — I6529 Occlusion and stenosis of unspecified carotid artery: Secondary | ICD-10-CM

## 2012-02-16 NOTE — Procedures (Unsigned)
CAROTID DUPLEX EXAM  INDICATION:  Carotid stenosis.  HISTORY: Diabetes:  Yes. Cardiac:  No. Hypertension:  Yes. Smoking:  Currently. Previous Surgery:  No carotid intervention. CV History:  Currently asymptomatic. Amaurosis Fugax No, Paresthesias No, Hemiparesis No                                      RIGHT             LEFT Brachial systolic pressure:         116               114 Brachial Doppler waveforms:         WNL               WNL Vertebral direction of flow:        Antegrade         Anteg4rade DUPLEX VELOCITIES (cm/sec) CCA peak systolic                   79                115 ECA peak systolic                   64                62 ICA peak systolic                   119-P 130-P/M     69 ICA end diastolic                   49-P 51-P/M       31 PLAQUE MORPHOLOGY:                  Homogenous        Heterogenous PLAQUE AMOUNT:                      Moderate          Mild PLAQUE LOCATION:                    CCA/ICA           CCA/ICA  IMPRESSION: 1. Right internal carotid artery stenosis present in the 40-59% range. 2. Bilateral external carotid arteries appear patent. 3. Left internal carotid artery stenosis present in the 1-39% range. 4. Bilateral vertebral arteries are patent and antegrade.  ___________________________________________ V. Charlena Cross, MD  SH/MEDQ  D:  02/10/2012  T:  02/10/2012  Job:  [id number]

## 2012-05-03 ENCOUNTER — Other Ambulatory Visit (HOSPITAL_COMMUNITY): Payer: Self-pay | Admitting: Family Medicine

## 2012-05-03 DIAGNOSIS — Z1231 Encounter for screening mammogram for malignant neoplasm of breast: Secondary | ICD-10-CM

## 2012-05-24 ENCOUNTER — Ambulatory Visit (HOSPITAL_COMMUNITY)
Admission: RE | Admit: 2012-05-24 | Discharge: 2012-05-24 | Disposition: A | Discharge status: Home / Self Care | Payer: Self-pay | Source: Ambulatory Visit | Attending: Family Medicine | Admitting: Family Medicine

## 2012-05-24 DIAGNOSIS — Z1231 Encounter for screening mammogram for malignant neoplasm of breast: Secondary | ICD-10-CM | POA: Insufficient documentation

## 2012-10-01 ENCOUNTER — Other Ambulatory Visit: Payer: Self-pay | Admitting: Gastroenterology

## 2013-02-08 ENCOUNTER — Ambulatory Visit: Payer: Self-pay | Admitting: Neurosurgery

## 2013-02-08 ENCOUNTER — Other Ambulatory Visit: Payer: Self-pay

## 2013-03-07 ENCOUNTER — Other Ambulatory Visit: Payer: Self-pay

## 2013-03-25 ENCOUNTER — Encounter: Payer: Self-pay | Admitting: Surgery

## 2013-03-28 ENCOUNTER — Encounter: Payer: Self-pay | Admitting: Surgery

## 2013-03-28 ENCOUNTER — Other Ambulatory Visit (INDEPENDENT_AMBULATORY_CARE_PROVIDER_SITE_OTHER): Payer: BC Managed Care – PPO | Admitting: *Deleted

## 2013-03-28 ENCOUNTER — Ambulatory Visit (INDEPENDENT_AMBULATORY_CARE_PROVIDER_SITE_OTHER): Payer: BC Managed Care – PPO | Admitting: Surgery

## 2013-03-28 DIAGNOSIS — I6529 Occlusion and stenosis of unspecified carotid artery: Secondary | ICD-10-CM | POA: Insufficient documentation

## 2013-03-28 NOTE — Progress Notes (Signed)
[facility]   Patient name: Morgan Davenport MRN: [id number] DOB: Sep 01, 1961 Sex: female     Chief Complaint  Patient presents with  . Carotid    1 year f/u - pt interested in receiving a rx for nicotine patches    HISTORY OF PRESENT ILLNESS: The patient is back today for followup of her carotid occlusive disease. She was initially sent to me in 2011. At that time, she had a head CT scan for headaches. One of the findings was concerning for subarachnoid hemorrhage. She underwent an arteriogram that did not show any evidence of aneurysm. There was however 35-40% stenosis of the right internal carotid artery and 40% stenosis of the left subclavian artery. It was later felt that the head CT scan was more consistent with an area of hypodensity and possibly calcification rather than subarachnoid hemorrhage. I have been following her for her carotid occlusive disease. She returns today he continues to be asymptomatic. Specifically, she denies numbness or weakness in either extremity. She denies slurred speech. She denies amaurosis fugax.  The patient continues to smoke. She has increased from 2 cigarettes to 6 cigarettes. She continues to take her antihypertensive medication.  Past Medical History  Diagnosis Date  . Hypertension   . Diabetes mellitus     Past Surgical History  Procedure Laterality Date  . Abdominal hysterectomy      History   Social History  . Marital Status: Single    Spouse Name: N/A    Number of Children: N/A  . Years of Education: N/A   Occupational History  . Not on file.   Social History Main Topics  . Smoking status: Current Every Day Smoker -- 0.50 packs/day for 10 years    Types: Cigarettes  . Smokeless tobacco: Never Used     Comment: talked with pt about trying the patches   . Alcohol Use: No  . Drug Use: No  . Sexually Active: Not on file   Other Topics Concern  . Not on file   Social History Narrative  . No  narrative on file    Family History  Problem Relation Age of Onset  . Diabetes Mother   . Hypertension Mother   . Heart attack Mother     Allergies as of 03/28/2013  . (No Known Allergies)    Current Outpatient Prescriptions on File Prior to Visit  Medication Sig Dispense Refill  . gabapentin (NEURONTIN) 100 MG capsule Take 100 mg by mouth daily.       . hydrochlorothiazide (HYDRODIURIL) 25 MG tablet Take 25 mg by mouth daily.      . Multiple Vitamin (MULITIVITAMIN WITH MINERALS) TABS Take 1 tablet by mouth daily.      . sertraline (ZOLOFT) 100 MG tablet Take 150 mg by mouth daily.       No current facility-administered medications on file prior to visit.     REVIEW OF SYSTEMS: Cardiovascular: No chest pain, chest pressure, palpitations, orthopnea, or dyspnea on exertion. No claudication or rest pain,  No history of DVT or phlebitis. Pulmonary: No productive cough, asthma or wheezing. Neurologic: No weakness, paresthesias, aphasia, or amaurosis. No dizziness. Hematologic: No bleeding problems or clotting disorders. Musculoskeletal: No joint pain or joint swelling. Gastrointestinal: No blood in stool or hematemesis Genitourinary: No dysuria or hematuria. Psychiatric:: No history of major depression. Integumentary: No rashes or ulcers. Constitutional: No fever or chills.  PHYSICAL EXAMINATION:   Vital signs are BP 119/66  Pulse  72  Ht 5\' 4"  (1.626 m)  Wt 136 lb (61.689 kg)  BMI 23.33 kg/m2  SpO2 100% General: The patient appears their stated age. HEENT:  No gross abnormalities Pulmonary:  Non labored breathing Musculoskeletal: There are no major deformities. Neurologic: No focal weakness or paresthesias are detected, Skin: There are no ulcer or rashes noted. Psychiatric: The patient has normal affect. Cardiovascular: There is a regular rate and rhythm without significant murmur appreciated. No carotid bruit   Diagnostic Studies Carotid duplex was ordered today.  This shows 40-59% bilateral carotid stenosis.  Assessment: Asymptomatic bilateral moderate carotid stenosis Plan: I discussed again with the patient the importance of smoking cessation. She is ready to quit and will consider using the patches.  Up until the patient reaches 80% stenosis or until she becomes symptomatic, she is better treated medically rather than surgically for her carotid stenosis. This would include good control of her diabetes and hypertension. In addition, I would favor placing her on a statin, regardless of her cholesterol profile. She will followup in one year with a repeat carotid ultrasound.  The patient does complain of leg fatigue and some spider veins on her legs. I have told her to try a light compression stockings.  Jorge Ny, M.D. [facility] Office: [phone] Pager:  [phone]

## 2013-03-28 NOTE — Addendum Note (Signed)
Addended by: Adria Dill L on: 03/28/2013 04:25 PM   Modules accepted: Orders

## 2013-04-10 ENCOUNTER — Encounter (HOSPITAL_COMMUNITY): Payer: Self-pay | Admitting: Emergency Medicine

## 2013-04-10 ENCOUNTER — Emergency Department (HOSPITAL_COMMUNITY)
Admission: EM | Admit: 2013-04-10 | Discharge: 2013-04-10 | Disposition: A | Discharge status: Home / Self Care | Payer: BC Managed Care – PPO | Attending: Emergency Medicine | Admitting: Emergency Medicine

## 2013-04-10 DIAGNOSIS — E119 Type 2 diabetes mellitus without complications: Secondary | ICD-10-CM | POA: Insufficient documentation

## 2013-04-10 DIAGNOSIS — F17200 Nicotine dependence, unspecified, uncomplicated: Secondary | ICD-10-CM | POA: Insufficient documentation

## 2013-04-10 DIAGNOSIS — J069 Acute upper respiratory infection, unspecified: Secondary | ICD-10-CM | POA: Insufficient documentation

## 2013-04-10 DIAGNOSIS — Z79899 Other long term (current) drug therapy: Secondary | ICD-10-CM | POA: Insufficient documentation

## 2013-04-10 DIAGNOSIS — I10 Essential (primary) hypertension: Secondary | ICD-10-CM | POA: Insufficient documentation

## 2013-04-10 DIAGNOSIS — H9209 Otalgia, unspecified ear: Secondary | ICD-10-CM | POA: Insufficient documentation

## 2013-04-10 LAB — RAPID STREP SCREEN (MED CTR MEBANE ONLY): Streptococcus, Group A Screen (Direct): NEGATIVE

## 2013-04-10 NOTE — ED Notes (Signed)
Pt c/o sore throat and rt ear pain since yesterday.

## 2013-04-10 NOTE — ED Provider Notes (Signed)
History    CSN: [id number] Arrival date & time 04/10/13  6213  First MD Initiated Contact with Patient 04/10/13 1020     Chief Complaint  Patient presents with  . Sore Throat  . Otalgia   (Consider location/radiation/quality/duration/timing/severity/associated sxs/prior Treatment) HPI Comments: 52 y.o. Female with PMHx of HTN and DM2 presents today complaining of sore throat and right ear pain with gradual onset yesterday. Worsening over the last 24 hours. Pt took 2 advil last night and 2 this morning with no relief. Constant. Localized. No discharge from the ear. Difficulty swallowing, but eating and drinking OK.    PCP - Dr. Doristine Locks  Patient is a 52 y.o. female presenting with pharyngitis and ear pain.  Sore Throat This is a new problem. The current episode started yesterday. The problem occurs constantly. The problem has been gradually worsening. Associated symptoms include a sore throat. Pertinent negatives include no abdominal pain, chest pain, chills, congestion, coughing, diaphoresis, fever, headaches, nausea, neck pain, numbness, rash, urinary symptoms, vomiting or weakness. Nothing aggravates the symptoms. She has tried NSAIDs for the symptoms. The treatment provided no relief.  Otalgia Associated symptoms: sore throat   Associated symptoms: no abdominal pain, no congestion, no cough, no diarrhea, no ear discharge, no fever, no headaches, no hearing loss, no neck pain, no rash and no vomiting    Past Medical History  Diagnosis Date  . Hypertension   . Diabetes mellitus    Past Surgical History  Procedure Laterality Date  . Abdominal hysterectomy     Family History  Problem Relation Age of Onset  . Diabetes Mother   . Hypertension Mother   . Heart attack Mother    History  Substance Use Topics  . Smoking status: Current Every Day Smoker -- 0.50 packs/day for 10 years    Types: Cigarettes  . Smokeless tobacco: Never Used     Comment: talked with pt about trying  the patches   . Alcohol Use: No   OB History   Grav Para Term Preterm Abortions TAB SAB Ect Mult Living                 Review of Systems  Constitutional: Negative for fever, chills and diaphoresis.  HENT: Positive for ear pain and sore throat. Negative for hearing loss, congestion, neck pain, neck stiffness, dental problem, sinus pressure and ear discharge.        Right ear pain, painful to swallow  Eyes: Negative for visual disturbance.  Respiratory: Negative for apnea, cough, chest tightness and shortness of breath.   Cardiovascular: Negative for chest pain and palpitations.  Gastrointestinal: Negative for nausea, vomiting, abdominal pain, diarrhea and constipation.  Genitourinary: Negative for dysuria.  Musculoskeletal: Negative for gait problem.  Skin: Negative for rash.  Neurological: Negative for dizziness, weakness, light-headedness, numbness and headaches.    Allergies  Tramadol  Home Medications   Current Outpatient Rx  Name  Route  Sig  Dispense  Refill  . clonazePAM (KLONOPIN) 0.5 MG tablet   Oral   Take 1 tablet by mouth daily as needed for anxiety.          . gabapentin (NEURONTIN) 100 MG capsule   Oral   Take 100 mg by mouth daily.          Marland Kitchen glipiZIDE (GLUCOTROL) 5 MG tablet   Oral   Take 1 tablet by mouth 2 (two) times daily.         . hydrochlorothiazide (HYDRODIURIL) 25 MG tablet  Oral   Take 25 mg by mouth daily.         Marland Kitchen ibuprofen (ADVIL,MOTRIN) 200 MG tablet   Oral   Take 400 mg by mouth every 6 (six) hours as needed for pain.         . Multiple Vitamin (MULITIVITAMIN WITH MINERALS) TABS   Oral   Take 1 tablet by mouth daily.         . sertraline (ZOLOFT) 100 MG tablet   Oral   Take 150 mg by mouth daily.          BP 132/85  Pulse 65  Temp(Src) 98.3 F (36.8 C) (Oral)  Resp 16  SpO2 98% Physical Exam  Nursing note and vitals reviewed. Constitutional: She is oriented to person, place, and time. She appears  well-developed and well-nourished. No distress.  HENT:  Head: Normocephalic and atraumatic. No trismus in the jaw.  Right Ear: Tympanic membrane and external ear normal. No drainage, swelling or tenderness. Tympanic membrane is not erythematous, not retracted and not bulging.  Left Ear: Tympanic membrane and external ear normal. No drainage, swelling or tenderness. Tympanic membrane is not erythematous, not retracted and not bulging.  Nose: No mucosal edema or rhinorrhea. Right sinus exhibits no maxillary sinus tenderness and no frontal sinus tenderness. Left sinus exhibits no maxillary sinus tenderness and no frontal sinus tenderness.  Mouth/Throat: No dental abscesses. Posterior oropharyngeal erythema present. No oropharyngeal exudate, posterior oropharyngeal edema or tonsillar abscesses.  Eyes: Conjunctivae and EOM are normal.  Neck: Normal range of motion. Neck supple.  No meningeal signs  Cardiovascular: Normal rate, regular rhythm and normal heart sounds.  Exam reveals no gallop and no friction rub.   No murmur heard. Pulmonary/Chest: Effort normal and breath sounds normal. No respiratory distress. She has no wheezes. She has no rales. She exhibits no tenderness.  Abdominal: Soft. Bowel sounds are normal. She exhibits no distension. There is no tenderness. There is no rebound and no guarding.  Musculoskeletal: Normal range of motion. She exhibits no edema and no tenderness.  FROM to upper and lower extremities  Neurological: She is alert and oriented to person, place, and time. No cranial nerve deficit.  Speech is clear and goal oriented, follows commands Normal gait and balance Normal strength in upper and lower extremities bilaterally including dorsiflexion and plantar flexion, strong and equal grip strength   Skin: Skin is warm and dry. She is not diaphoretic. No erythema.  Psychiatric: She has a normal mood and affect.    ED Course  Procedures (including critical care time) Labs  Reviewed  RAPID STREP SCREEN  CULTURE, GROUP A STREP   No results found. 1. Upper respiratory infection     MDM  Symptoms are consistent with URI and pharyngitis, likely viral etiology. Discussed that antibiotics are not indicated for viral infections. Pt will be discharged with symptomatic treatment.  Verbalizes understanding and is agreeable with plan. Pt is hemodynamically stable & in NAD prior to dc.   Glade Nurse, PA-C 04/10/13 1611

## 2013-04-11 NOTE — ED Provider Notes (Signed)
Medical screening examination/treatment/procedure(s) were performed by non-physician practitioner and as supervising physician I was immediately available for consultation/collaboration.   Aleila Syverson Y. Meelah Tallo, MD 04/11/13 1553 

## 2013-04-12 LAB — CULTURE, GROUP A STREP

## 2013-05-09 ENCOUNTER — Other Ambulatory Visit (HOSPITAL_COMMUNITY): Payer: Self-pay | Admitting: Family Medicine

## 2013-05-09 DIAGNOSIS — Z1231 Encounter for screening mammogram for malignant neoplasm of breast: Secondary | ICD-10-CM

## 2013-05-11 ENCOUNTER — Encounter (HOSPITAL_COMMUNITY): Payer: Self-pay | Admitting: Emergency Medicine

## 2013-05-11 ENCOUNTER — Emergency Department (HOSPITAL_COMMUNITY): Payer: BC Managed Care – PPO

## 2013-05-11 ENCOUNTER — Emergency Department (HOSPITAL_COMMUNITY)
Admission: EM | Admit: 2013-05-11 | Discharge: 2013-05-11 | Disposition: A | Discharge status: Home / Self Care | Payer: BC Managed Care – PPO | Attending: Emergency Medicine | Admitting: Emergency Medicine

## 2013-05-11 DIAGNOSIS — E119 Type 2 diabetes mellitus without complications: Secondary | ICD-10-CM | POA: Insufficient documentation

## 2013-05-11 DIAGNOSIS — Z79899 Other long term (current) drug therapy: Secondary | ICD-10-CM | POA: Insufficient documentation

## 2013-05-11 DIAGNOSIS — F17200 Nicotine dependence, unspecified, uncomplicated: Secondary | ICD-10-CM | POA: Insufficient documentation

## 2013-05-11 DIAGNOSIS — R51 Headache: Secondary | ICD-10-CM | POA: Insufficient documentation

## 2013-05-11 DIAGNOSIS — H81399 Other peripheral vertigo, unspecified ear: Secondary | ICD-10-CM | POA: Insufficient documentation

## 2013-05-11 DIAGNOSIS — I10 Essential (primary) hypertension: Secondary | ICD-10-CM | POA: Insufficient documentation

## 2013-05-11 DIAGNOSIS — R269 Unspecified abnormalities of gait and mobility: Secondary | ICD-10-CM | POA: Insufficient documentation

## 2013-05-11 DIAGNOSIS — R110 Nausea: Secondary | ICD-10-CM | POA: Insufficient documentation

## 2013-05-11 LAB — CBC WITH DIFFERENTIAL/PLATELET
Basophils Absolute: 0 10*3/uL (ref 0.0–0.1)
Basophils Relative: 0 % (ref 0–1)
Eosinophils Absolute: 0.1 10*3/uL (ref 0.0–0.7)
Eosinophils Relative: 1 % (ref 0–5)
HCT: 42.6 % (ref 36.0–46.0)
Hemoglobin: 14.2 g/dL (ref 12.0–15.0)
Lymphocytes Relative: 42 % (ref 12–46)
Lymphs Abs: 4.2 10*3/uL — ABNORMAL HIGH (ref 0.7–4.0)
MCH: 28.3 pg (ref 26.0–34.0)
MCHC: 33.3 g/dL (ref 30.0–36.0)
MCV: 84.9 fL (ref 78.0–100.0)
Monocytes Absolute: 0.8 10*3/uL (ref 0.1–1.0)
Monocytes Relative: 7 % (ref 3–12)
Neutro Abs: 5 10*3/uL (ref 1.7–7.7)
Neutrophils Relative %: 49 % (ref 43–77)
Platelets: 254 10*3/uL (ref 150–400)
RBC: 5.02 MIL/uL (ref 3.87–5.11)
RDW: 13.3 % (ref 11.5–15.5)
WBC: 10.1 10*3/uL (ref 4.0–10.5)

## 2013-05-11 LAB — BASIC METABOLIC PANEL
BUN: 21 mg/dL (ref 6–23)
CO2: 30 mEq/L (ref 19–32)
Calcium: 9.9 mg/dL (ref 8.4–10.5)
Chloride: 103 mEq/L (ref 96–112)
Creatinine, Ser: 0.67 mg/dL (ref 0.50–1.10)
GFR calc Af Amer: >90 mL/min (ref >90)
GFR calc non Af Amer: >90 mL/min (ref >90)
Glucose, Bld: 72 mg/dL (ref 70–99)
Potassium: 3.7 mEq/L (ref 3.5–5.1)
Sodium: 139 mEq/L (ref 135–145)

## 2013-05-11 MED ORDER — METOCLOPRAMIDE HCL 5 MG/ML IJ SOLN
10.0000 mg | Freq: Once | INTRAMUSCULAR | Status: AC
  Administered 2013-05-11: 10 mg via INTRAVENOUS
  Filled 2013-05-11 (×2): qty 2

## 2013-05-11 MED ORDER — MECLIZINE HCL 25 MG PO TABS
25.0000 mg | ORAL_TABLET | Freq: Once | ORAL | Status: AC
  Administered 2013-05-11: 25 mg via ORAL
  Filled 2013-05-11: qty 1

## 2013-05-11 MED ORDER — MECLIZINE HCL 50 MG PO TABS: 50.0000 mg | ORAL_TABLET | Freq: Three times a day (TID) | ORAL | Status: DC | PRN

## 2013-05-11 MED ORDER — DIPHENHYDRAMINE HCL 50 MG/ML IJ SOLN
12.5000 mg | Freq: Once | INTRAMUSCULAR | Status: AC
  Administered 2013-05-11: 12.5 mg via INTRAVENOUS
  Filled 2013-05-11: qty 1

## 2013-05-11 NOTE — ED Notes (Signed)
Pt c/o dizziness and headache x1 day. Reports going to work today and was sent home due to pt being excessively dizziness.  Denies mental status or LOC.  No hx of vertigo.

## 2013-05-11 NOTE — ED Provider Notes (Signed)
CSN: [id number]     Arrival date & time 05/11/13  1429 History     First MD Initiated Contact with Patient 05/11/13 1502     Chief Complaint  Patient presents with  . Dizziness  . Headache   (Consider location/radiation/quality/duration/timing/severity/associated sxs/prior Treatment) HPI Comments: Patient with history of hypertension, well-controlled diabetes, possible subarachnoid hemorrhage in 2011 that was later thought to be incidental calcification, carotid stenosis -- presents with complaint of dizziness and headache. Dizziness began upon awaking at approximately 6 AM today. It is described as a sensation that the patient is moving. It is worse with position and movement of her head. It is associated with nausea. Patient also describes unilateral left-sided headache that is throbbing in nature. This started this afternoon. Patient took ibuprofen without relief. It is similar to previous migraines however she reports that she has not had a headache in several years. It is associated with phonophobia. No extremity weakness, numbness, or tingling. No vision trouble. Onset of symptoms acute. Course is waxing and waning. Nothing makes symptoms better.  Patient is a 52 y.o. female presenting with headaches. The history is provided by the patient and medical records.  Headache Associated symptoms: dizziness and nausea   Associated symptoms: no congestion, no fever, no neck pain, no neck stiffness, no numbness, no photophobia, no sinus pressure and no vomiting     Past Medical History  Diagnosis Date  . Hypertension   . Diabetes mellitus    Past Surgical History  Procedure Laterality Date  . Abdominal hysterectomy     Family History  Problem Relation Age of Onset  . Diabetes Mother   . Hypertension Mother   . Heart attack Mother    History  Substance Use Topics  . Smoking status: Current Every Day Smoker -- 0.50 packs/day for 10 years    Types: Cigarettes  . Smokeless tobacco:  Never Used     Comment: talked with pt about trying the patches   . Alcohol Use: No   OB History   Grav Para Term Preterm Abortions TAB SAB Ect Mult Living                 Review of Systems  Constitutional: Negative for fever.  HENT: Negative for congestion, rhinorrhea, neck pain, neck stiffness, dental problem and sinus pressure.   Eyes: Negative for photophobia, discharge, redness and visual disturbance.  Respiratory: Negative for shortness of breath.   Cardiovascular: Negative for chest pain.  Gastrointestinal: Positive for nausea. Negative for vomiting.  Musculoskeletal: Positive for gait problem (trouble with balance).  Skin: Negative for rash.  Neurological: Positive for dizziness and headaches. Negative for syncope, speech difficulty, weakness, light-headedness and numbness.  Psychiatric/Behavioral: Negative for confusion.    Allergies  Tramadol  Home Medications   Current Outpatient Rx  Name  Route  Sig  Dispense  Refill  . clonazePAM (KLONOPIN) 0.5 MG tablet   Oral   Take 0.5 mg by mouth daily as needed for anxiety.          . gabapentin (NEURONTIN) 100 MG capsule   Oral   Take 100 mg by mouth every morning.          Marland Kitchen glipiZIDE (GLUCOTROL) 5 MG tablet   Oral   Take 5 mg by mouth 2 (two) times daily.          . hydrochlorothiazide (HYDRODIURIL) 25 MG tablet   Oral   Take 25 mg by mouth daily.         Marland Kitchen  ibuprofen (ADVIL,MOTRIN) 200 MG tablet   Oral   Take 400 mg by mouth every 8 (eight) hours as needed for pain.          . Multiple Vitamin (MULITIVITAMIN WITH MINERALS) TABS   Oral   Take 1 tablet by mouth daily.         . sertraline (ZOLOFT) 100 MG tablet   Oral   Take 150 mg by mouth daily.          BP 126/83  Pulse 64  Temp(Src) 98.9 F (37.2 C) (Oral)  Resp 12  SpO2 97% Physical Exam  Nursing note and vitals reviewed. Constitutional: She is oriented to person, place, and time. She appears well-developed and well-nourished.   HENT:  Head: Normocephalic and atraumatic.  Right Ear: Tympanic membrane, external ear and ear canal normal.  Left Ear: Tympanic membrane, external ear and ear canal normal.  Nose: Nose normal.  Mouth/Throat: Uvula is midline, oropharynx is clear and moist and mucous membranes are normal.  Eyes: Conjunctivae and lids are normal. Pupils are equal, round, and reactive to light. Right eye exhibits nystagmus (With leftward gaze , horizontal). Left eye exhibits nystagmus (with leftward gaze, horizontal).  Neck: Normal range of motion. Neck supple.  Cardiovascular: Normal rate and regular rhythm.   Pulmonary/Chest: Effort normal and breath sounds normal.  Abdominal: Soft. There is no tenderness.  Musculoskeletal:       Cervical back: She exhibits normal range of motion, no tenderness and no bony tenderness.  Neurological: She is alert and oriented to person, place, and time. She has normal strength and normal reflexes. No cranial nerve deficit or sensory deficit. She displays a negative Romberg sign. Coordination and gait normal. GCS eye subscore is 4. GCS verbal subscore is 5. GCS motor subscore is 6.  Normal coordination. Patient ambulates without assistance. Normal neurological examination except for nystagmus.  Skin: Skin is warm and dry.  Psychiatric: She has a normal mood and affect.    ED Course   Procedures (including critical care time)  Labs Reviewed  CBC WITH DIFFERENTIAL - Abnormal; Notable for the following:    Lymphs Abs 4.2 (*)    All other components within normal limits  BASIC METABOLIC PANEL   Ct Head Wo Contrast  05/11/2013   *RADIOLOGY REPORT*  Clinical Data: Headache, dizziness  CT HEAD WITHOUT CONTRAST  Technique:  Contiguous axial images were obtained from the base of the skull through the vertex without contrast.  Comparison: None.  Findings: No evidence of parenchymal hemorrhage or extra-axial fluid collection. No mass lesion, mass effect, or midline shift.  No CT  evidence of acute infarction.  Cerebral volume is age appropriate.  No ventriculomegaly.  Partial opacification of the ethmoid sinuses. Mastoid air cells are clear.  No evidence of calvarial fracture.  IMPRESSION: No evidence of acute intracranial abnormality.   Original Report Authenticated By: Charline Bills, M.D.   1. Peripheral vertigo, unspecified laterality     3:49 PM Patient seen and examined. Work-up initiated. Medications ordered.   Vital signs reviewed and are as follows: Filed Vitals:   05/11/13 1450  BP: 126/83  Pulse: 64  Temp: 98.9 F (37.2 C)  Resp: 12   01/2012:  1. Right internal carotid artery stenosis present in the 40-59% range.  2. Bilateral external carotid arteries appear patent.  3. Left internal carotid artery stenosis present in the 1-39% range.  4. Bilateral vertebral arteries are patent and antegrade.   Date: 05/11/2013  Rate: 60  Rhythm: normal sinus rhythm  QRS Axis: normal  Intervals: normal  ST/T Wave abnormalities: normal  Conduction Disutrbances:none  Narrative Interpretation:   Old EKG Reviewed: unchanged from 08/13/10  5:01 PM Still has HA but just received medication. Still is dizzy. Added meclizine.   6:03 PM Pt d/w Dr. Elesa Massed who has seen. Patient with much improvement after reglan/benadryl. Will d/c to home with meclizine.   Urged PCP f/u.   Patient counseled to return if they have weakness in their arms or legs, slurred speech, trouble walking or talking, confusion, trouble with their balance, or if they have any other concerns. Patient verbalizes understanding and agrees with plan.    MDM  Patient with dizziness. Seems like BPPV with nystagmus, positional nature. Suspect HA coincidental. No thunder clap on set. Do not suspect meningitis. CT done due to ? h/o SAH -- this is negative. Pt improved with reglan/benadryl. She appears well. Ambulatory without assistance. At this point, doubt posterior circulation stroke. Urged PCP f/u.  Home with antivert.   Renne Crigler, PA-C 05/11/13 1806

## 2013-05-11 NOTE — ED Provider Notes (Signed)
Medical screening examination/treatment/procedure(s) were conducted as a shared visit with non-physician practitioner(s) and myself.  I personally evaluated the patient during the encounter and agree with physical exam and plan of care.  Patient is a 52 y.o. AAF with h/o migraine HA who presents to the ED with her typical migraine headache. She states it has been several years since her last headache but she denies that this was thunderclap. She does have some vertiginous symptoms with this headache. She states her vertigo is worse with turning her head and resolved with staying still.  She has no neurologic deficits on exam. She reports that her headache is almost completely resolved after migraine cocktail. She's had prior imaging was concerning for subarachnoid hemorrhage but on further investigation, imaging suggested possible calcification. No concern for intracranial hemorrhage, mass or infarct at this time.  Will dc home with meclizine for peripheral vertigo and instructions to return to ED with worsening HA, thunderclap HA, neuro deficits.  Layla Maw Ward, DO 05/11/13 [phone]

## 2013-05-11 NOTE — ED Notes (Signed)
Pt ambulated in hallway without difficulty.  Denies dizziness.  Pt c/o mild headache.  PA notified.

## 2013-05-31 ENCOUNTER — Ambulatory Visit (HOSPITAL_COMMUNITY)
Admission: RE | Admit: 2013-05-31 | Discharge: 2013-05-31 | Disposition: A | Discharge status: Home / Self Care | Payer: BC Managed Care – PPO | Source: Ambulatory Visit | Attending: Family Medicine | Admitting: Family Medicine

## 2013-05-31 DIAGNOSIS — Z1231 Encounter for screening mammogram for malignant neoplasm of breast: Secondary | ICD-10-CM | POA: Insufficient documentation

## 2013-10-05 ENCOUNTER — Other Ambulatory Visit: Payer: Self-pay | Admitting: Internal Medicine

## 2013-10-05 DIAGNOSIS — C220 Liver cell carcinoma: Secondary | ICD-10-CM

## 2013-10-17 ENCOUNTER — Ambulatory Visit
Admission: RE | Admit: 2013-10-17 | Discharge: 2013-10-17 | Disposition: A | Discharge status: Home / Self Care | Payer: BC Managed Care – PPO | Source: Ambulatory Visit | Attending: Internal Medicine | Admitting: Internal Medicine

## 2013-10-17 DIAGNOSIS — C220 Liver cell carcinoma: Secondary | ICD-10-CM

## 2013-11-10 ENCOUNTER — Other Ambulatory Visit: Payer: Self-pay | Admitting: Internal Medicine

## 2013-11-10 DIAGNOSIS — C220 Liver cell carcinoma: Secondary | ICD-10-CM

## 2013-11-11 ENCOUNTER — Other Ambulatory Visit: Payer: Self-pay | Admitting: Surgery

## 2013-11-11 DIAGNOSIS — I6529 Occlusion and stenosis of unspecified carotid artery: Secondary | ICD-10-CM

## 2013-11-15 ENCOUNTER — Other Ambulatory Visit: Payer: BC Managed Care – PPO

## 2013-11-21 ENCOUNTER — Other Ambulatory Visit: Payer: Self-pay | Admitting: Gastroenterology

## 2013-11-23 ENCOUNTER — Ambulatory Visit
Admission: RE | Admit: 2013-11-23 | Discharge: 2013-11-23 | Disposition: A | Discharge status: Home / Self Care | Payer: BC Managed Care – PPO | Source: Ambulatory Visit | Attending: Internal Medicine | Admitting: Internal Medicine

## 2013-11-23 DIAGNOSIS — C220 Liver cell carcinoma: Secondary | ICD-10-CM

## 2013-11-23 MED ORDER — IOHEXOL 300 MG/ML  SOLN
100.0000 mL | Freq: Once | INTRAMUSCULAR | Status: AC | PRN
  Administered 2013-11-23: 100 mL via INTRAVENOUS

## 2014-03-24 ENCOUNTER — Encounter: Payer: Self-pay | Admitting: Family

## 2014-03-27 ENCOUNTER — Ambulatory Visit: Payer: BC Managed Care – PPO | Admitting: Surgery

## 2014-03-27 ENCOUNTER — Ambulatory Visit (HOSPITAL_COMMUNITY)
Admission: RE | Admit: 2014-03-27 | Discharge: 2014-03-27 | Disposition: A | Discharge status: Home / Self Care | Payer: BC Managed Care – PPO | Source: Ambulatory Visit | Attending: Family | Admitting: Family

## 2014-03-27 ENCOUNTER — Ambulatory Visit (INDEPENDENT_AMBULATORY_CARE_PROVIDER_SITE_OTHER): Payer: BC Managed Care – PPO | Admitting: Family

## 2014-03-27 ENCOUNTER — Other Ambulatory Visit (HOSPITAL_COMMUNITY): Payer: BC Managed Care – PPO

## 2014-03-27 ENCOUNTER — Encounter: Payer: Self-pay | Admitting: Family

## 2014-03-27 VITALS — BP 135/76 | HR 68 | Resp 16 | Ht 64.0 in | Wt 136.0 lb

## 2014-03-27 DIAGNOSIS — I6529 Occlusion and stenosis of unspecified carotid artery: Secondary | ICD-10-CM

## 2014-03-27 NOTE — Progress Notes (Signed)
Established Carotid Patient   History of Present Illness  Morgan Davenport is a 53 y.o. female patient of Dr. Trula Slade. The patient is back today for followup of her carotid occlusive disease. She was initially seen in 2011. At that time, she had a head CT scan for headaches. One of the findings was concerning for subarachnoid hemorrhage. She underwent an arteriogram that did not show any evidence of aneurysm. There was however 35-40% stenosis of the right internal carotid artery and 40% stenosis of the left subclavian artery. It was later felt that the head CT scan was more consistent with an area of hypodensity and possibly calcification rather than subarachnoid hemorrhage. She experienced headaches and dizziness with this which lasted about a month. She denies history of hemiparesis, denies sudden onset confusion with slurred speech or aphasia, denies sudden loss of vision. Pt denies claudication symptoms with walking, denies non healing wounds.  Patient has not had previous carotid artery intervention.   Pt reports New Medical or Surgical History: had work done on teeth.  Pt Diabetic: Yes, 6.? She states was her last A1C Pt smoker: smoker  (6-7 cigarettes/day, started at age 72 yrs, quit, then resumed)  Pt meds include: Statin : No ASA: No Other anticoagulants/antiplatelets: no   Past Medical History  Diagnosis Date  . Hypertension   . Diabetes mellitus     Social History History  Substance Use Topics  . Smoking status: Light Tobacco Smoker -- 0.50 packs/day for 10 years    Types: Cigarettes  . Smokeless tobacco: Never Used     Comment: talked with pt about trying the patches   . Alcohol Use: No    Family History Family History  Problem Relation Age of Onset  . Diabetes Mother   . Hypertension Mother   . Hyperlipidemia Mother   . Heart disease Mother     NOT before age 90  . Heart attack Mother     Surgical History Past Surgical History  Procedure Laterality  Date  . Abdominal hysterectomy  Dec. 24, 2008    Allergies  Allergen Reactions  . Tramadol Other (See Comments)    Headache    Current Outpatient Prescriptions  Medication Sig Dispense Refill  . clonazePAM (KLONOPIN) 0.5 MG tablet Take 0.5 mg by mouth daily as needed for anxiety.       Marland Kitchen glipiZIDE (GLUCOTROL) 5 MG tablet Take 5 mg by mouth 2 (two) times daily.       . hydrochlorothiazide (HYDRODIURIL) 25 MG tablet Take 25 mg by mouth daily.      Marland Kitchen ibuprofen (ADVIL,MOTRIN) 200 MG tablet Take 400 mg by mouth every 8 (eight) hours as needed for pain.       . meclizine (ANTIVERT) 50 MG tablet Take 1 tablet (50 mg total) by mouth 3 (three) times daily as needed.  30 tablet  0  . Multiple Vitamin (MULITIVITAMIN WITH MINERALS) TABS Take 1 tablet by mouth daily.      . sertraline (ZOLOFT) 100 MG tablet Take 100 mg by mouth daily.       Marland Kitchen gabapentin (NEURONTIN) 100 MG capsule Take 100 mg by mouth every morning.        No current facility-administered medications for this visit.    Review of Systems : See HPI for pertinent positives and negatives.  Physical Examination   Filed Vitals:   03/27/14 1454 03/27/14 1458  BP: 132/82 135/76  Pulse: 70 68  Resp:  16  Height:  5\' 4"  (  1.626 m)  Weight:  136 lb (61.689 kg)  SpO2:  100%   Body mass index is 23.33 kg/(m^2).  General: WDWN female in NAD GAIT: normal Eyes: PERRLA Pulmonary:  Non-labored, CTAB, Negative  Rales, Negative rhonchi, & Negative wheezing.  Cardiac: regular Rhythm ,  Negative detected murmur.  VASCULAR EXAM Carotid Bruits Left Right   Negative Negative     Radial pulses are 2+ palpable and equal.                                                                                                                            LE Pulses LEFT RIGHT       POPLITEAL  not palpable   not palpable       POSTERIOR TIBIAL  2+ palpable   2+ palpable        DORSALIS PEDIS      ANTERIOR TIBIAL 2+ palpable  2+ palpable      Gastrointestinal: soft, nontender, BS WNL, no r/g,  negative masses.  Musculoskeletal: Negative muscle atrophy/wasting. M/S 5/5 throughout, Extremities without ischemic changes.  Neurologic: A&O X 3; Appropriate Affect ; SENSATION ;normal;  Speech is normal CN 2-12 intact, Pain and light touch intact in extremities, Motor exam as listed above.   Non-Invasive Vascular Imaging CAROTID DUPLEX 03/27/2014   CEREBROVASCULAR DUPLEX EVALUATION    INDICATION: Carotid artery disease     PREVIOUS INTERVENTION(S): No carotid intervention.    DUPLEX EXAM: Patient had went to ER after experiencing dizziness August 2014.    RIGHT  LEFT  Peak Systolic Velocities (cm/s) End Diastolic Velocities (cm/s) Plaque LOCATION Peak Systolic Velocities (cm/s) End Diastolic Velocities (cm/s) Plaque  96 17  CCA PROXIMAL 138 29   75 21  CCA MID 98 27   58 16 HT CCA DISTAL 62 20   72 15 HT ECA 68 16 HT  129 60 HT ICA PROXIMAL 35 14 HT  179 85 HT ICA MID 60 28 HT  117 35  ICA DISTAL 83 34     2.38 ICA / CCA Ratio (PSV) 0.84  Antegrade  Vertebral Flow Antegrade   741 Brachial Systolic Pressure (mmHg) 287  Triphasic  Brachial Artery Waveforms Triphasic     Plaque Morphology:  HM = Homogeneous, HT = Heterogeneous, CP = Calcific Plaque, SP = Smooth Plaque, IP = Irregular Plaque     ADDITIONAL FINDINGS:     IMPRESSION: Right internal carotid artery velocities suggest a 40-59% stenosis (high end of range).  Left internal carotid artery velocities suggest a <40% stenosis.     Compared to the previous exam:  Disease progression of the right internal carotid artery; left remains stable since last exam on 03/28/2013.    Assessment: Morgan Davenport is a 53 y.o. female who presents with asymptomatic 40 - 59 % right ICA stenosis and <40% left ICA stenosis. Disease progression of the right internal carotid artery; left remains stable since last exam on 03/28/2013.  Plan: Follow-up in 1 year with Carotid  Duplex scan.  Start 81 mg ASA as anti platelet aggregate.  Dr. Trula Slade favors starting her on a statin regardless of her cholesterol profile, will defer to her PCP to manage this. She was counseled re smoking cessation.  I discussed in depth with the patient the nature of atherosclerosis, and emphasized the importance of maximal medical management including strict control of blood pressure, blood glucose, and lipid levels, obtaining regular exercise, and cessation of smoking.  The patient is aware that without maximal medical management the underlying atherosclerotic disease process will progress, limiting the benefit of any interventions. The patient was given information about stroke prevention and what symptoms should prompt the patient to seek immediate medical care. Thank you for allowing [place] to participate in this patient's care.  Clemon Chambers, RN, MSN, FNP-C [facility] Office: [facility] Physician: Trula Slade  03/27/2014 3:08 PM

## 2014-03-27 NOTE — Patient Instructions (Addendum)
Stroke Prevention Some medical conditions and behaviors are associated with an increased chance of having a stroke. You may prevent a stroke by making healthy choices and managing medical conditions. HOW CAN I REDUCE MY RISK OF HAVING A STROKE?   Stay physically active. Get at least 30 minutes of activity on most or all days.  Do not smoke. It may also be helpful to avoid exposure to secondhand smoke.  Limit alcohol use. Moderate alcohol use is considered to be:  No more than 2 drinks per day for men.  No more than 1 drink per day for nonpregnant women.  Eat healthy foods. This involves  Eating 5 or more servings of fruits and vegetables a day.  Following a diet that addresses high blood pressure (hypertension), high cholesterol, diabetes, or obesity.  Manage your cholesterol levels.  A diet low in saturated fat, trans fat, and cholesterol and high in fiber may control cholesterol levels.  Take any prescribed medicines to control cholesterol as directed by your health care provider.  Manage your diabetes.  A controlled-carbohydrate, controlled-sugar diet is recommended to manage diabetes.  Take any prescribed medicines to control diabetes as directed by your health care provider.  Control your hypertension.  A low-salt (sodium), low-saturated fat, low-trans fat, and low-cholesterol diet is recommended to manage hypertension.  Take any prescribed medicines to control hypertension as directed by your health care provider.  Maintain a healthy weight.  A reduced-calorie, low-sodium, low-saturated fat, low-trans fat, low-cholesterol diet is recommended to manage weight.  Stop drug abuse.  Avoid taking birth control pills.  Talk to your health care provider about the risks of taking birth control pills if you are over 35 years old, smoke, get migraines, or have ever had a blood clot.  Get evaluated for sleep disorders (sleep apnea).  Talk to your health care provider about  getting a sleep evaluation if you snore a lot or have excessive sleepiness.  Take medicines as directed by your health care provider.  For some people, aspirin or blood thinners (anticoagulants) are helpful in reducing the risk of forming abnormal blood clots that can lead to stroke. If you have the irregular heart rhythm of atrial fibrillation, you should be on a blood thinner unless there is a good reason you cannot take them.  Understand all your medicine instructions.  Make sure that other other conditions (such as anemia or atherosclerosis) are addressed. SEEK IMMEDIATE MEDICAL CARE IF:   You have sudden weakness or numbness of the face, arm, or leg, especially on one side of the body.  Your face or eyelid droops to one side.  You have sudden confusion.  You have trouble speaking (aphasia) or understanding.  You have sudden trouble seeing in one or both eyes.  You have sudden trouble walking.  You have dizziness.  You have a loss of balance or coordination.  You have a sudden, severe headache with no known cause.  You have new chest pain or an irregular heartbeat. Any of these symptoms may represent a serious problem that is an emergency. Do not wait to see if the symptoms will go away. Get medical help at once. Call your local emergency services  (911 in [place] Do not drive yourself to the hospital. Document Released: 10/23/2004 Document Revised: 07/06/2013 Document Reviewed: 03/18/2013 [employer] Patient Information 2015 [employer]. This information is not intended to replace advice given to you by your health care provider. Make sure you discuss any questions you have with your health   care provider.   Smoking Cessation Quitting smoking is important to your health and has many advantages. However, it is not always easy to quit since nicotine is a very addictive drug. Often times, people try 3 times or more before being able to quit. This document explains the best ways  for you to prepare to quit smoking. Quitting takes hard work and a lot of effort, but you can do it. ADVANTAGES OF QUITTING SMOKING  You will live longer, feel better, and live better.  Your body will feel the impact of quitting smoking almost immediately.  Within 20 minutes, blood pressure decreases. Your pulse returns to its normal level.  After 8 hours, carbon monoxide levels in the blood return to normal. Your oxygen level increases.  After 24 hours, the chance of having a heart attack starts to decrease. Your breath, hair, and body stop smelling like smoke.  After 48 hours, damaged nerve endings begin to recover. Your sense of taste and smell improve.  After 72 hours, the body is virtually free of nicotine. Your bronchial tubes relax and breathing becomes easier.  After 2 to 12 weeks, lungs can hold more air. Exercise becomes easier and circulation improves.  The risk of having a heart attack, stroke, cancer, or lung disease is greatly reduced.  After 1 year, the risk of coronary heart disease is cut in half.  After 5 years, the risk of stroke falls to the same as a nonsmoker.  After 10 years, the risk of lung cancer is cut in half and the risk of other cancers decreases significantly.  After 15 years, the risk of coronary heart disease drops, usually to the level of a nonsmoker.  If you are pregnant, quitting smoking will improve your chances of having a healthy baby.  The people you live with, especially any children, will be healthier.  You will have extra money to spend on things other than cigarettes. QUESTIONS TO THINK ABOUT BEFORE ATTEMPTING TO QUIT You may want to talk about your answers with your caregiver.  Why do you want to quit?  If you tried to quit in the past, what helped and what did not?  What will be the most difficult situations for you after you quit? How will you plan to handle them?  Who can help you through the tough times? Your family? Friends?  A caregiver?  What pleasures do you get from smoking? What ways can you still get pleasure if you quit? Here are some questions to ask your caregiver:  How can you help me to be successful at quitting?  What medicine do you think would be best for me and how should I take it?  What should I do if I need more help?  What is smoking withdrawal like? How can I get information on withdrawal? GET READY  Set a quit date.  Change your environment by getting rid of all cigarettes, ashtrays, matches, and lighters in your home, car, or work. Do not let people smoke in your home.  Review your past attempts to quit. Think about what worked and what did not. GET SUPPORT AND ENCOURAGEMENT You have a better chance of being successful if you have help. You can get support in many ways.  Tell your family, friends, and co-workers that you are going to quit and need their support. Ask them not to smoke around you.  Get individual, group, or telephone counseling and support. Programs are available at [employer] and health centers. Call  your local health department for information about programs in your area.  Spiritual beliefs and practices may help some smokers quit.  Download a "quit meter" on your computer to keep track of quit statistics, such as how long you have gone without smoking, cigarettes not smoked, and money saved.  Get a self-help book about quitting smoking and staying off of tobacco. [place] yourself from urges to smoke. Talk to someone, go for a walk, or occupy your time with a task.  Change your normal routine. Take a different route to work. Drink tea instead of coffee. Eat breakfast in a different place.  Reduce your stress. Take a hot bath, exercise, or read a book.  Plan something enjoyable to do every day. Reward yourself for not smoking.  Explore interactive web-based programs that specialize in helping you quit. GET MEDICINE AND  USE IT CORRECTLY Medicines can help you stop smoking and decrease the urge to smoke. Combining medicine with the above behavioral methods and support can greatly increase your chances of successfully quitting smoking.  Nicotine replacement therapy helps deliver nicotine to your body without the negative effects and risks of smoking. Nicotine replacement therapy includes nicotine gum, lozenges, inhalers, nasal sprays, and skin patches. Some may be available over-the-counter and others require a prescription.  Antidepressant medicine helps people abstain from smoking, but how this works is unknown. This medicine is available by prescription.  Nicotinic receptor partial agonist medicine simulates the effect of nicotine in your brain. This medicine is available by prescription. Ask your caregiver for advice about which medicines to use and how to use them based on your health history. Your caregiver will tell you what side effects to look out for if you choose to be on a medicine or therapy. Carefully read the information on the package. Do not use any other product containing nicotine while using a nicotine replacement product.  RELAPSE OR DIFFICULT SITUATIONS Most relapses occur within the first 3 months after quitting. Do not be discouraged if you start smoking again. Remember, most people try several times before finally quitting. You may have symptoms of withdrawal because your body is used to nicotine. You may crave cigarettes, be irritable, feel very hungry, cough often, get headaches, or have difficulty concentrating. The withdrawal symptoms are only temporary. They are strongest when you first quit, but they will go away within 10-14 days. To reduce the chances of relapse, try to:  Avoid drinking alcohol. Drinking lowers your chances of successfully quitting.  Reduce the amount of caffeine you consume. Once you quit smoking, the amount of caffeine in your body increases and can give you symptoms,  such as a rapid heartbeat, sweating, and anxiety.  Avoid smokers because they can make you want to smoke.  Do not let weight gain distract you. Many smokers will gain weight when they quit, usually less than 10 pounds. Eat a healthy diet and stay active. You can always lose the weight gained after you quit.  Find ways to improve your mood other than smoking. FOR MORE INFORMATION  www.smokefree.gov  Document Released: 09/09/2001 Document Revised: 03/16/2012 Document Reviewed: 12/25/2011 [facility] Patient Information 2015 [place], [place]. This information is not intended to replace advice given to you by your health care provider. Make sure you discuss any questions you have with your health care provider.

## 2014-05-02 ENCOUNTER — Other Ambulatory Visit (HOSPITAL_COMMUNITY): Payer: Self-pay | Admitting: Family Medicine

## 2014-05-02 DIAGNOSIS — Z1231 Encounter for screening mammogram for malignant neoplasm of breast: Secondary | ICD-10-CM

## 2014-06-06 ENCOUNTER — Ambulatory Visit (HOSPITAL_COMMUNITY)
Admission: RE | Admit: 2014-06-06 | Discharge: 2014-06-06 | Disposition: A | Discharge status: Home / Self Care | Payer: BC Managed Care – PPO | Source: Ambulatory Visit | Attending: Family Medicine | Admitting: Family Medicine

## 2014-06-06 DIAGNOSIS — Z1231 Encounter for screening mammogram for malignant neoplasm of breast: Secondary | ICD-10-CM | POA: Insufficient documentation

## 2014-07-24 ENCOUNTER — Encounter: Payer: BC Managed Care – PPO | Attending: Family Medicine | Admitting: *Deleted

## 2014-07-24 ENCOUNTER — Encounter: Payer: Self-pay | Admitting: *Deleted

## 2014-07-24 VITALS — Ht 64.0 in | Wt 144.7 lb

## 2014-07-24 DIAGNOSIS — E1142 Type 2 diabetes mellitus with diabetic polyneuropathy: Secondary | ICD-10-CM | POA: Diagnosis not present

## 2014-07-24 DIAGNOSIS — Z713 Dietary counseling and surveillance: Secondary | ICD-10-CM | POA: Diagnosis not present

## 2014-07-24 NOTE — Progress Notes (Signed)
Diabetes Self-Management Education  Visit Type:    Appt. Start Time: 10:30am Appt. End Time: 11:30am  07/24/2014  Ms. [facility], identified by name and date of birth, is a 53 y.o. female with a diagnosis of Diabetes: Type 2.  Other people present during visit:  Patient   ASSESSMENT  Height 5\' 4"  (1.626 m), weight 144 lb 11.2 oz (65.635 kg). Body mass index is 24.83 kg/(m^2).  Patient with diabetes for the last 5 years. She has not been following a diabetic meal plan. However, she has a high desire to learn to control her blood glucose to prevent complications (neuropathy, amputations). She has an active job, but would like to exercise more (walking her dog). She tests her blood glucose in the mornings with values usually <100, but occasionally as high as 160. A1c in August was 6.2%. She used to experience hypoglycemia frequently, but has only had 1 episode in the last month.   Initial Visit Information: Are you currently following a meal plan?: No   Are you taking your medications as prescribed?: Yes Are you checking your feet?: No   How often do you need to have someone help you when you read instructions, pamphlets, or other written materials from your doctor or pharmacy?: 1 - Never    Psychosocial: Patient Belief/Attitude about Diabetes: Motivated to manage diabetes Self-care barriers: None Self-management support: Doctor's office Other persons present: Patient Patient Concerns: Nutrition/Meal planning;Medication;Monitoring;Glycemic Control;Weight Control Special Needs: None Preferred Learning Style: Visual;Auditory;Hands on Learning Readiness: Ready  Complications:  Last HgB A1C per patient/outside source: 6.2 mg/dL Number of hypoglycemic episodes per month: 1 Can you tell when your blood sugar is low?: Yes What do you do if your blood sugar is low?: Eats something sweet (apple juice, soda) Number of hyperglycemic episodes per week: 1  Diet Intake: Breakfast:  Usually skips Snack (morning): None Lunch: Sandwich OR hamburger, fries Snack (afternoon): sweets Dinner: Frozen dinner ([employer]) Snack (evening): Sweets Beverage(s): Water  Exercise: Exercise: ADL's;Other (comment) (Patient has active job, on feet, lifting, she also walks her dog 3-4 days a week)  Individualized Plan for Diabetes Self-Management Training:   Learning Objective:  Patient will have a greater understanding of diabetes self-management.   Education Topics Reviewed with Patient Today: Role of diet in the treatment of diabetes and the relationship between the three main macronutrients and blood glucose level; Food label reading, portion sizes and measuring food.; Carbohydrate counting; Reviewed blood glucose goals for pre and post meals and how to evaluate the patients' food intake on their blood glucose level. Meal timing in regards to the patients' current diabetes medication.;Information on hints to eating out and maintain blood glucose control.; Meal options for control of blood glucose level and chronic complications. Role of exercise on diabetes management, blood pressure control and cardiac health.; Helped patient identify appropriate exercises in relation to his/her diabetes, diabetes complications and other health issue. Interpreting lab values - A1C, lipid, urine microalbumina.  PATIENTS GOALS/Plan (Developed by the patient): Nutrition: Follow meal plan discussed   (2-3 carb servings at meals, 1 serving at snacks) Physical Activity: Exercise 3-5 times per week   (Walking dog 20-30 minutes most days) Reducing Risk: do foot checks daily;stop smoking;treat hypoglycemia with 15 grams of carbs if blood glucose less than 70mg /dL  Expected Outcomes:  Demonstrated interest in learning. Expect positive outcomes  Education material provided: Meal plan card and Carbohydrate counting sheet  If problems or questions, patient to contact team via:  Phone  Future DSME  appointment: 4-6 wks

## 2014-08-14 IMAGING — US US ABDOMEN LIMITED
1 series · 14 of 25 positions shown · non-contrast
Comparison: April 02, 2010

CLINICAL DATA: Hepatitis-C

EXAM:
US ABDOMEN LIMITED - RIGHT UPPER QUADRANT

[Series 1: us abdomen limited · 0.24mm/px · 14 of 33 slices shown]
[im 1/33]
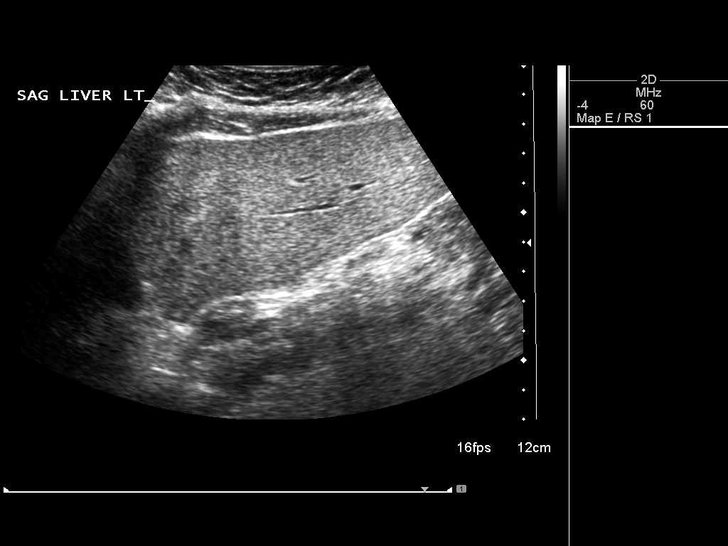
[im 3/33]
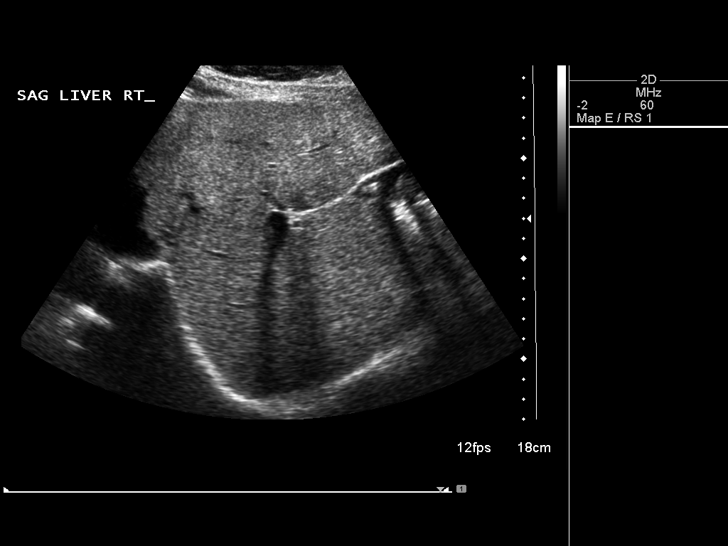
[im 6/33]
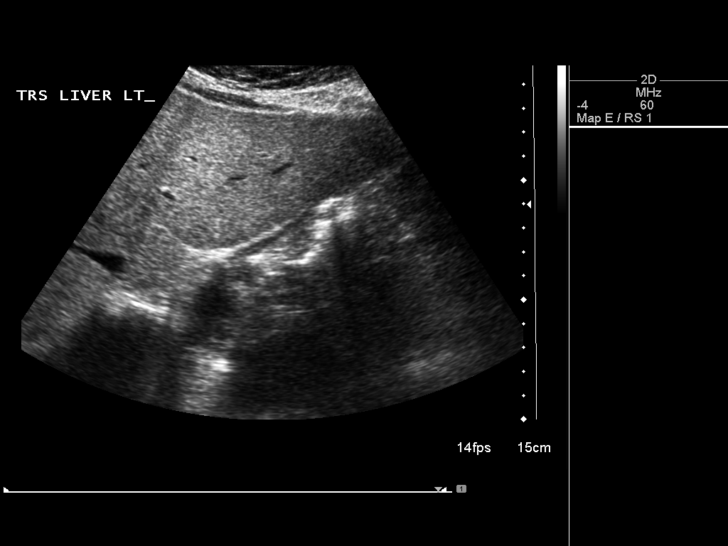
[im 9/33]
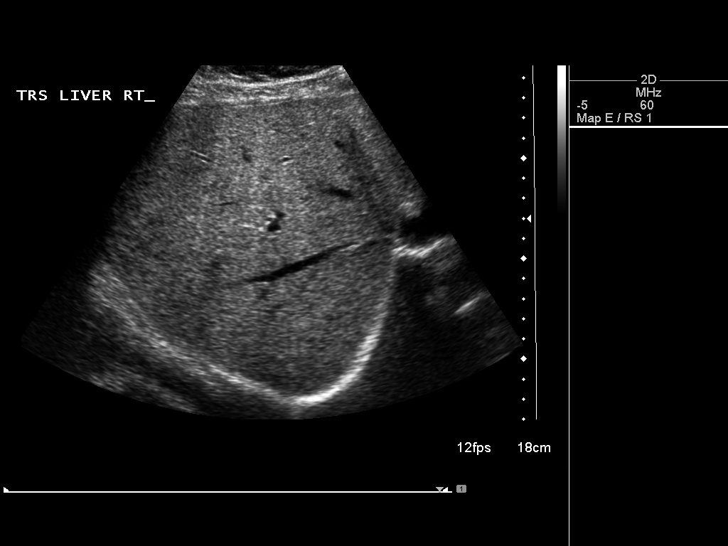
[im 11/33]
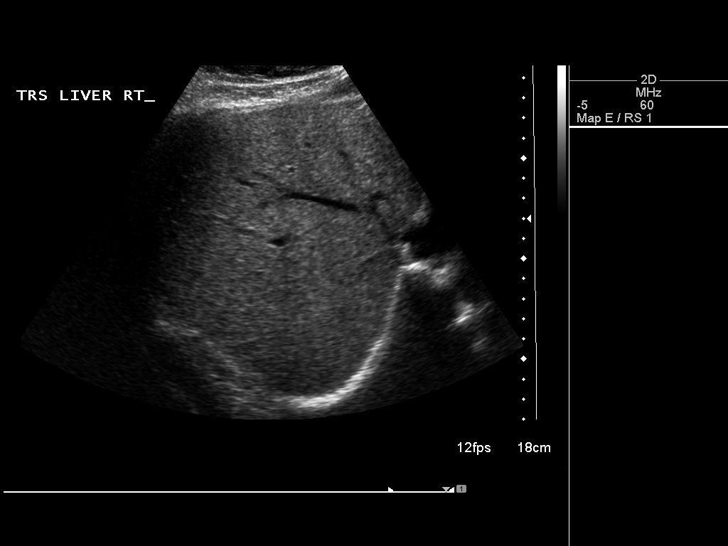
[im 13/33]
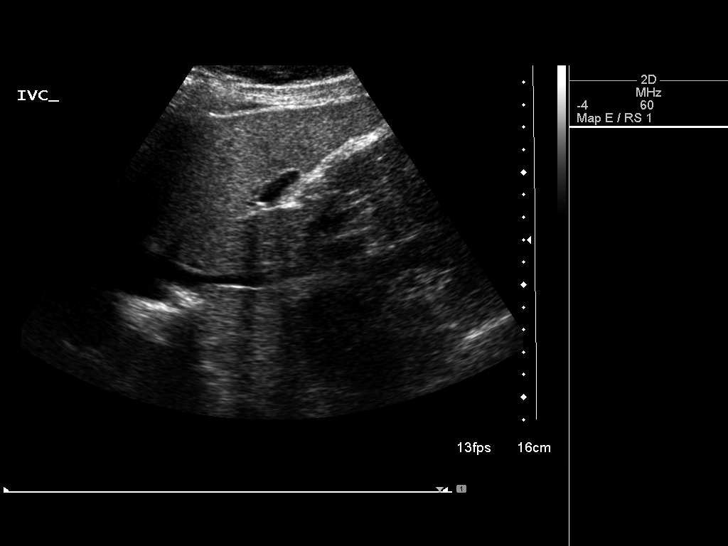
[im 15/33]
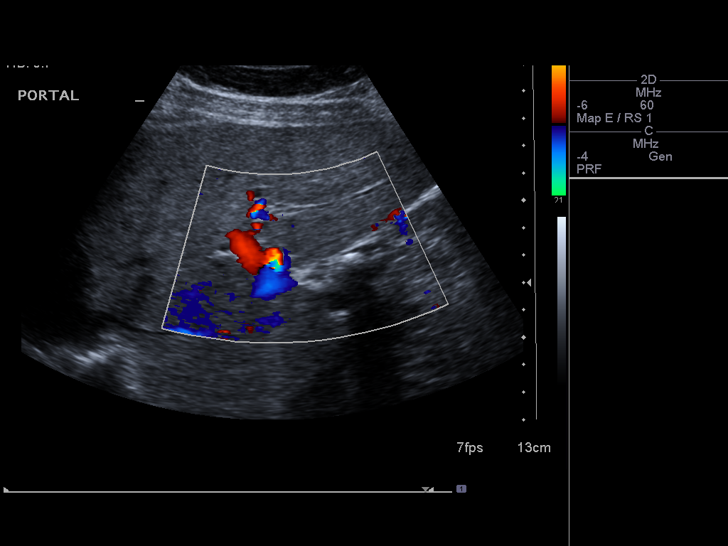
[im 18/33]
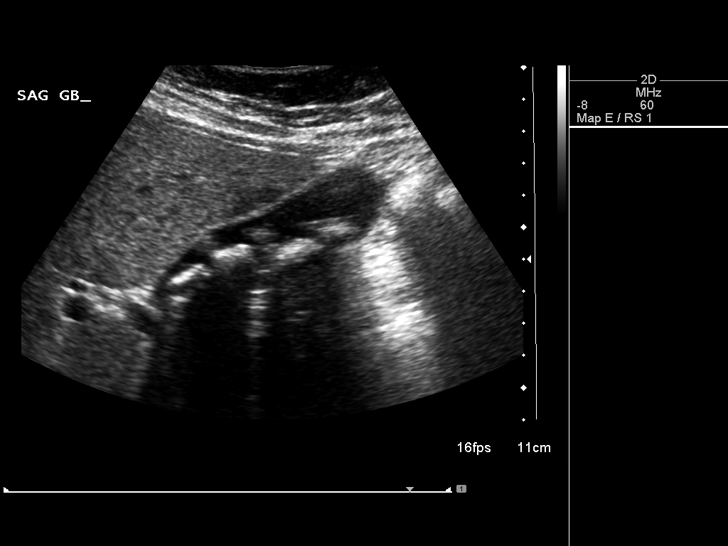
[im 21/33]
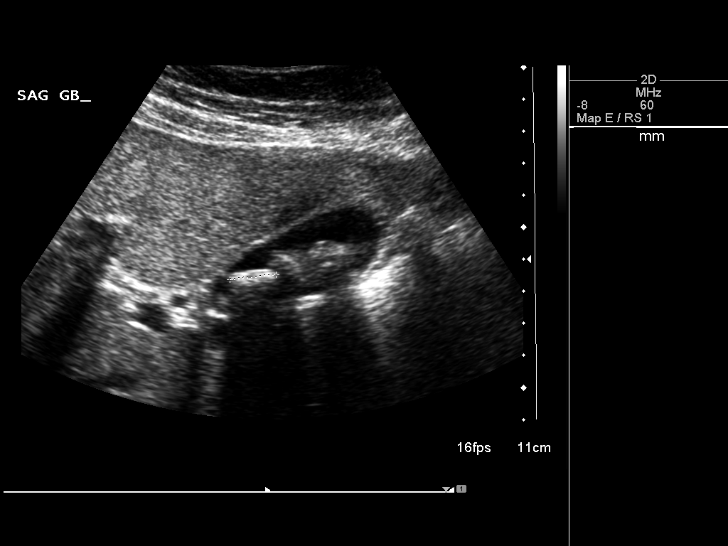
[im 22/33]
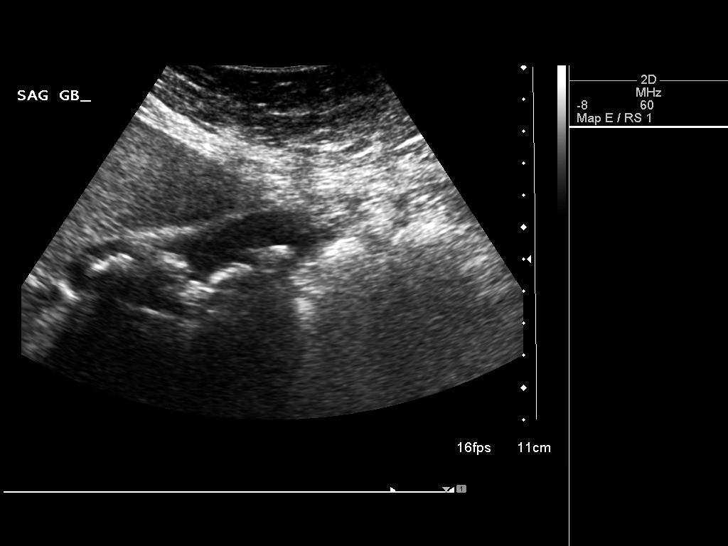
[im 25/33]
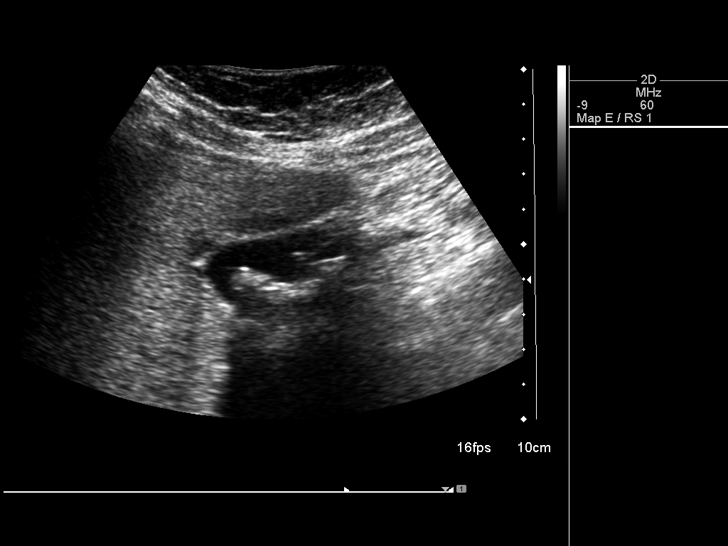
[im 27/33]
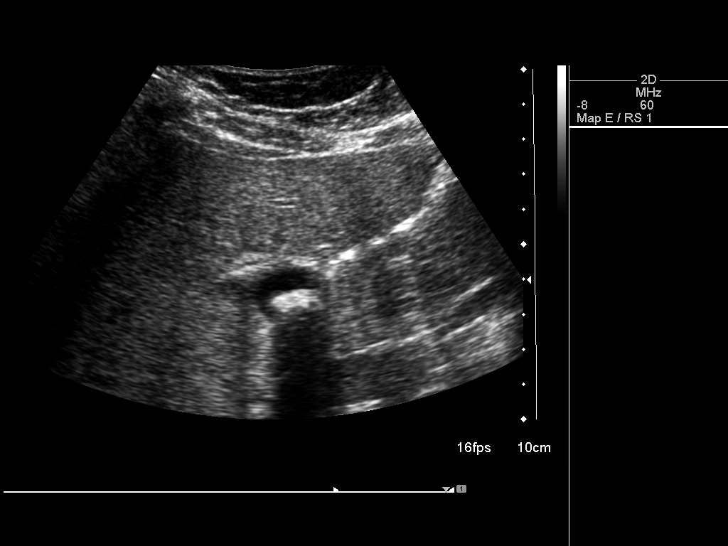
[im 30/33]
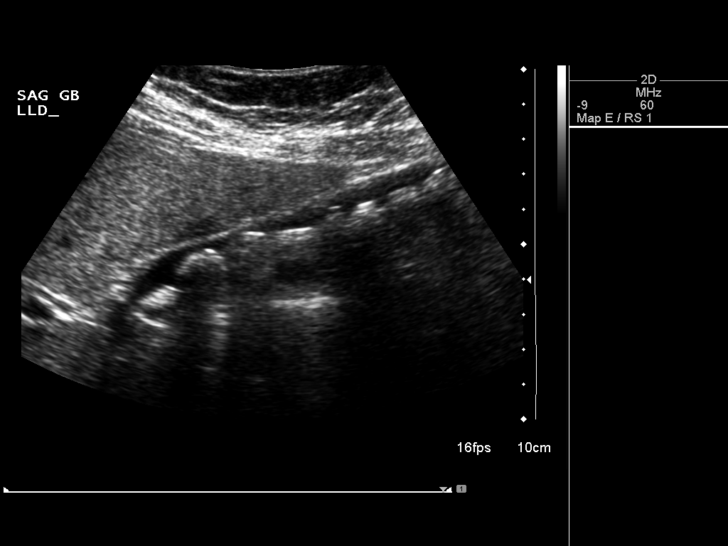
[im 33/33]
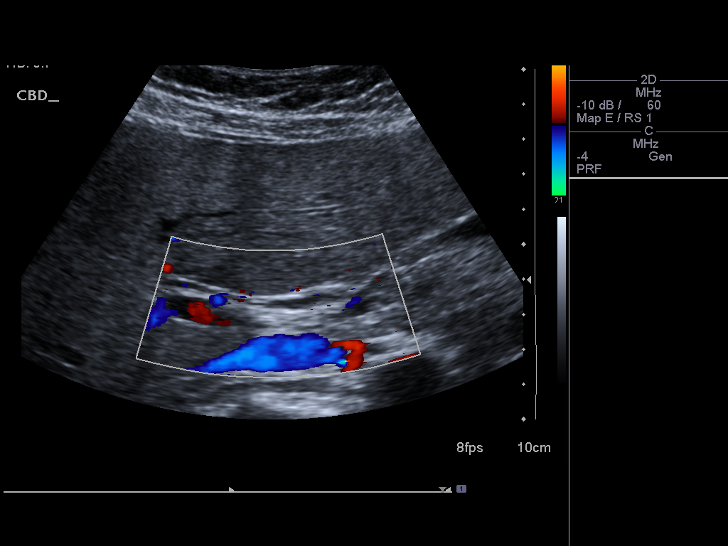

[14 of 25 positions shown; findings below may reference images not displayed]

FINDINGS: Gallbladder

Within the gallbladder, there are multiple echogenic foci which move
and shadow consistent with gallstones. The largest individual
gallstone measures 1.5 cm in length. There is no gallbladder wall
thickening or pericholecystic fluid. No sonographic Murphy sign
noted.

Common bile duct

Diameter: 4 mm. There is no intrahepatic or extrahepatic biliary
duct dilatation.

Liver:

No focal lesion identified. Within normal limits in parenchymal
echogenicity.
IMPRESSION: Cholelithiasis.  No appreciable liver lesions.

## 2014-09-04 ENCOUNTER — Ambulatory Visit: Payer: BC Managed Care – PPO | Admitting: *Deleted

## 2014-09-05 ENCOUNTER — Other Ambulatory Visit: Payer: Self-pay | Admitting: Nurse Practitioner

## 2014-09-05 DIAGNOSIS — K7469 Other cirrhosis of liver: Secondary | ICD-10-CM

## 2014-09-12 ENCOUNTER — Ambulatory Visit
Admission: RE | Admit: 2014-09-12 | Discharge: 2014-09-12 | Disposition: A | Discharge status: Home / Self Care | Payer: BC Managed Care – PPO | Source: Ambulatory Visit | Attending: Nurse Practitioner | Admitting: Nurse Practitioner

## 2014-09-12 DIAGNOSIS — K7469 Other cirrhosis of liver: Secondary | ICD-10-CM

## 2015-01-12 ENCOUNTER — Other Ambulatory Visit: Payer: Self-pay | Admitting: Family Medicine

## 2015-01-12 DIAGNOSIS — R51 Headache: Principal | ICD-10-CM

## 2015-01-12 DIAGNOSIS — R519 Headache, unspecified: Secondary | ICD-10-CM

## 2015-01-15 ENCOUNTER — Other Ambulatory Visit: Payer: Self-pay | Admitting: Family Medicine

## 2015-01-15 DIAGNOSIS — R519 Headache, unspecified: Secondary | ICD-10-CM

## 2015-01-15 DIAGNOSIS — R51 Headache: Principal | ICD-10-CM

## 2015-01-16 ENCOUNTER — Other Ambulatory Visit: Payer: Self-pay

## 2015-01-25 ENCOUNTER — Ambulatory Visit
Admission: RE | Admit: 2015-01-25 | Discharge: 2015-01-25 | Disposition: A | Discharge status: Home / Self Care | Payer: 59 | Source: Ambulatory Visit | Attending: Family Medicine | Admitting: Family Medicine

## 2015-01-25 DIAGNOSIS — R51 Headache: Principal | ICD-10-CM

## 2015-01-25 DIAGNOSIS — R519 Headache, unspecified: Secondary | ICD-10-CM

## 2015-01-25 MED ORDER — GADOBENATE DIMEGLUMINE 529 MG/ML IV SOLN
13.0000 mL | Freq: Once | INTRAVENOUS | Status: AC | PRN
  Administered 2015-01-25: 13 mL via INTRAVENOUS

## 2015-04-05 ENCOUNTER — Encounter: Payer: Self-pay | Admitting: Family

## 2015-04-09 ENCOUNTER — Ambulatory Visit (INDEPENDENT_AMBULATORY_CARE_PROVIDER_SITE_OTHER): Payer: 59 | Admitting: Family

## 2015-04-09 ENCOUNTER — Encounter: Payer: Self-pay | Admitting: Family

## 2015-04-09 ENCOUNTER — Ambulatory Visit (HOSPITAL_COMMUNITY)
Admission: RE | Admit: 2015-04-09 | Discharge: 2015-04-09 | Disposition: A | Discharge status: Home / Self Care | Payer: 59 | Source: Ambulatory Visit | Attending: Family | Admitting: Family

## 2015-04-09 VITALS — BP 111/71 | HR 72 | Resp 16 | Wt 146.0 lb

## 2015-04-09 DIAGNOSIS — I6523 Occlusion and stenosis of bilateral carotid arteries: Secondary | ICD-10-CM

## 2015-04-09 DIAGNOSIS — Z720 Tobacco use: Secondary | ICD-10-CM

## 2015-04-09 DIAGNOSIS — F17200 Nicotine dependence, unspecified, uncomplicated: Secondary | ICD-10-CM

## 2015-04-09 NOTE — Progress Notes (Signed)
Established Carotid Patient   History of Present Illness  Morgan Davenport is a 54 y.o. female  patient of Dr. Trula Slade. The patient is back today for followup of her carotid occlusive disease. She was initially seen in 2011. At that time, she had a head CT scan for headaches. One of the findings was concerning for subarachnoid hemorrhage. She underwent an arteriogram that did not show any evidence of aneurysm. There was however 35-40% stenosis of the right internal carotid artery and 40% stenosis of the left subclavian artery. It was later felt that the head CT scan was more consistent with an area of hypodensity and possibly calcification rather than subarachnoid hemorrhage. She experienced headaches and dizziness with this which she still has intermittently.   She denies history of hemiparesis, denies sudden onset confusion with slurred speech or aphasia, denies sudden loss of vision.  Pt denies claudication symptoms with walking, denies non healing wounds.  Patient has not had previous carotid artery intervention.  Pt reports New Medical or Surgical History: started a statin since her June 2015 visit.   Pt Diabetic: Yes, 6.? She states was her last A1C Pt smoker: smoker (6-7 cigarettes/day, started at age 67 yrs, quit, then resumed)  Pt meds include: Statin : yes ASA: No Other anticoagulants/antiplatelets: no    Past Medical History  Diagnosis Date  . Hypertension   . Diabetes mellitus     Social History History  Substance Use Topics  . Smoking status: Light Tobacco Smoker -- 0.50 packs/day for 10 years    Types: Cigarettes  . Smokeless tobacco: Never Used     Comment: talked with pt about trying the patches   . Alcohol Use: No    Family History Family History  Problem Relation Age of Onset  . Diabetes Mother   . Hypertension Mother   . Hyperlipidemia Mother   . Heart disease Mother     NOT before age 58  . Heart attack Mother     Surgical History Past  Surgical History  Procedure Laterality Date  . Abdominal hysterectomy  Dec. 24, 2008    Allergies  Allergen Reactions  . Tramadol Other (See Comments)    Headache    Current Outpatient Prescriptions  Medication Sig Dispense Refill  . clonazePAM (KLONOPIN) 0.5 MG tablet Take 0.5 mg by mouth daily as needed for anxiety.     . gabapentin (NEURONTIN) 100 MG capsule Take 100 mg by mouth every morning.     Marland Kitchen glipiZIDE (GLUCOTROL) 5 MG tablet Take 5 mg by mouth 2 (two) times daily.     . hydrochlorothiazide (HYDRODIURIL) 25 MG tablet Take 25 mg by mouth daily.    Marland Kitchen ibuprofen (ADVIL,MOTRIN) 200 MG tablet Take 400 mg by mouth every 8 (eight) hours as needed for pain.     . meclizine (ANTIVERT) 50 MG tablet Take 1 tablet (50 mg total) by mouth 3 (three) times daily as needed. 30 tablet 0  . Multiple Vitamin (MULITIVITAMIN WITH MINERALS) TABS Take 1 tablet by mouth daily.    . sertraline (ZOLOFT) 100 MG tablet Take 100 mg by mouth daily.      No current facility-administered medications for this visit.    Review of Systems : See HPI for pertinent positives and negatives.  Physical Examination  Filed Vitals:   04/09/15 1116 04/09/15 1118  BP: 108/64 111/71  Pulse: 72 72  Resp:  16  Weight:  146 lb (66.225 kg)  SpO2:  98%   Body mass  index is 25.05 kg/(m^2).   General: WDWN female in NAD GAIT: normal Eyes: PERRLA Pulmonary: Non-labored, CTAB, Negative Rales, Negative rhonchi, & Negative wheezing.  Cardiac: regular Rhythm, no detected murmur.  VASCULAR EXAM Carotid Bruits Left Right   Negative Negative   aorta is faintly palpable Radial pulses are 2+ palpable and equal.      LE Pulses LEFT RIGHT   POPLITEAL not palpable  not palpable   POSTERIOR TIBIAL 1+ palpable  1+ palpable    DORSALIS PEDIS  ANTERIOR  TIBIAL 2+ palpable  2+ palpable     Gastrointestinal: soft, nontender, BS WNL, no r/g,no palpable masses.  Musculoskeletal: Negative muscle atrophy/wasting. M/S 5/5 throughout, Extremities without ischemic changes.  Neurologic: A&O X 3; Appropriate Affect,  Speech is normal CN 2-12 intact, Pain and light touch intact in extremities, Motor exam as listed above.         Non-Invasive Vascular Imaging CAROTID DUPLEX 04/09/2015   Right ICA: 40 - 59 % stenosis. Left ICA: <40% stenosis.  Previous carotid studies (June, 2015) demonstrated: RICA <20% stenosis, LICA <10% stenosis.  These findings are Worse from previous exam, but are consistent with head/neck CT results from 2011.   Assessment: Morgan Davenport is a 54 y.o. female who has not history of stroke or TIA. Today's carotid Duplex suggests 40-59% right internal carotid artery stenosis and minimal (<40%) left ICA stenosis. Increased stenosis in the right ICA compared to Duplex in June 2015, but unchanged from head/neck CT results from 2011.  Fortunately her DM remains in good control and she was congratulated re this. Unfortunately she resumed smoking but expressed a desire to quit; see Plan. She is taking a statin.  Recommend daily 81 mg enteric coated ASA unless there is contraindication, will defer to pt's PCP whom pt states she will see next month.   Plan:  The patient was counseled re smoking cessation and given several free resources re smoking cessation.  Follow-up in 1 year with Carotid Duplex.   I discussed in depth with the patient the nature of atherosclerosis, and emphasized the importance of maximal medical management including strict control of blood pressure, blood glucose, and lipid levels, obtaining regular exercise, and cessation of smoking.  The patient is aware that without maximal medical management the underlying atherosclerotic disease process will progress, limiting the benefit of any  interventions. The patient was given information about stroke prevention and what symptoms should prompt the patient to seek immediate medical care. Thank you for allowing [place] to participate in this patient's care.  Clemon Chambers, RN, MSN, FNP-C [facility] Office: [facility] Physician: Trula Slade  04/09/2015 10:52 AM

## 2015-04-09 NOTE — Patient Instructions (Addendum)
Stroke Prevention Some medical conditions and behaviors are associated with an increased chance of having a stroke. You may prevent a stroke by making healthy choices and managing medical conditions. HOW CAN I REDUCE MY RISK OF HAVING A STROKE?   Stay physically active. Get at least 30 minutes of activity on most or all days.  Do not smoke. It may also be helpful to avoid exposure to secondhand smoke.  Limit alcohol use. Moderate alcohol use is considered to be:  No more than 2 drinks per day for men.  No more than 1 drink per day for nonpregnant women.  Eat healthy foods. This involves:  Eating 5 or more servings of fruits and vegetables a day.  Making dietary changes that address high blood pressure (hypertension), high cholesterol, diabetes, or obesity.  Manage your cholesterol levels.  Making food choices that are high in fiber and low in saturated fat, trans fat, and cholesterol may control cholesterol levels.  Take any prescribed medicines to control cholesterol as directed by your health care provider.  Manage your diabetes.  Controlling your carbohydrate and sugar intake is recommended to manage diabetes.  Take any prescribed medicines to control diabetes as directed by your health care provider.  Control your hypertension.  Making food choices that are low in salt (sodium), saturated fat, trans fat, and cholesterol is recommended to manage hypertension.  Take any prescribed medicines to control hypertension as directed by your health care provider.  Maintain a healthy weight.  Reducing calorie intake and making food choices that are low in sodium, saturated fat, trans fat, and cholesterol are recommended to manage weight.  Stop drug abuse.  Avoid taking birth control pills.  Talk to your health care provider about the risks of taking birth control pills if you are over 35 years old, smoke, get migraines, or have ever had a blood clot.  Get evaluated for sleep  disorders (sleep apnea).  Talk to your health care provider about getting a sleep evaluation if you snore a lot or have excessive sleepiness.  Take medicines only as directed by your health care provider.  For some people, aspirin or blood thinners (anticoagulants) are helpful in reducing the risk of forming abnormal blood clots that can lead to stroke. If you have the irregular heart rhythm of atrial fibrillation, you should be on a blood thinner unless there is a good reason you cannot take them.  Understand all your medicine instructions.  Make sure that other conditions (such as anemia or atherosclerosis) are addressed. SEEK IMMEDIATE MEDICAL CARE IF:   You have sudden weakness or numbness of the face, arm, or leg, especially on one side of the body.  Your face or eyelid droops to one side.  You have sudden confusion.  You have trouble speaking (aphasia) or understanding.  You have sudden trouble seeing in one or both eyes.  You have sudden trouble walking.  You have dizziness.  You have a loss of balance or coordination.  You have a sudden, severe headache with no known cause.  You have new chest pain or an irregular heartbeat. Any of these symptoms may represent a serious problem that is an emergency. Do not wait to see if the symptoms will go away. Get medical help at once. Call your local emergency services (911 in [place] Do not drive yourself to the hospital. Document Released: 10/23/2004 Document Revised: 01/30/2014 Document Reviewed: 03/18/2013 ExitCare Patient Information 2015 [employer]. This information is not intended to replace advice given   to you by your health care provider. Make sure you discuss any questions you have with your health care provider.    Smoking Cessation Quitting smoking is important to your health and has many advantages. However, it is not always easy to quit since nicotine is a very addictive drug. Oftentimes, people try 3 times or  more before being able to quit. This document explains the best ways for you to prepare to quit smoking. Quitting takes hard work and a lot of effort, but you can do it. ADVANTAGES OF QUITTING SMOKING  You will live longer, feel better, and live better.  Your body will feel the impact of quitting smoking almost immediately.  Within 20 minutes, blood pressure decreases. Your pulse returns to its normal level.  After 8 hours, carbon monoxide levels in the blood return to normal. Your oxygen level increases.  After 24 hours, the chance of having a heart attack starts to decrease. Your breath, hair, and body stop smelling like smoke.  After 48 hours, damaged nerve endings begin to recover. Your sense of taste and smell improve.  After 72 hours, the body is virtually free of nicotine. Your bronchial tubes relax and breathing becomes easier.  After 2 to 12 weeks, lungs can hold more air. Exercise becomes easier and circulation improves.  The risk of having a heart attack, stroke, cancer, or lung disease is greatly reduced.  After 1 year, the risk of coronary heart disease is cut in half.  After 5 years, the risk of stroke falls to the same as a nonsmoker.  After 10 years, the risk of lung cancer is cut in half and the risk of other cancers decreases significantly.  After 15 years, the risk of coronary heart disease drops, usually to the level of a nonsmoker.  If you are pregnant, quitting smoking will improve your chances of having a healthy baby.  The people you live with, especially any children, will be healthier.  You will have extra money to spend on things other than cigarettes. QUESTIONS TO THINK ABOUT BEFORE ATTEMPTING TO QUIT You may want to talk about your answers with your health care provider.  Why do you want to quit?  If you tried to quit in the past, what helped and what did not?  What will be the most difficult situations for you after you quit? How will you plan to  handle them?  Who can help you through the tough times? Your family? Friends? A health care provider?  What pleasures do you get from smoking? What ways can you still get pleasure if you quit? Here are some questions to ask your health care provider:  How can you help me to be successful at quitting?  What medicine do you think would be best for me and how should I take it?  What should I do if I need more help?  What is smoking withdrawal like? How can I get information on withdrawal? GET READY  Set a quit date.  Change your environment by getting rid of all cigarettes, ashtrays, matches, and lighters in your home, car, or work. Do not let people smoke in your home.  Review your past attempts to quit. Think about what worked and what did not. GET SUPPORT AND ENCOURAGEMENT You have a better chance of being successful if you have help. You can get support in many ways.  Tell your family, friends, and coworkers that you are going to quit and need their support. Ask them   not to smoke around you.  Get individual, group, or telephone counseling and support. Programs are available at local hospitals and health centers. Call your local health department for information about programs in your area.  Spiritual beliefs and practices may help some smokers quit.  Download a "quit meter" on your computer to keep track of quit statistics, such as how long you have gone without smoking, cigarettes not smoked, and money saved.  Get a self-help book about quitting smoking and staying off tobacco. LEARN NEW SKILLS AND BEHAVIORS  Distract yourself from urges to smoke. Talk to someone, go for a walk, or occupy your time with a task.  Change your normal routine. Take a different route to work. Drink tea instead of coffee. Eat breakfast in a different place.  Reduce your stress. Take a hot bath, exercise, or read a book.  Plan something enjoyable to do every day. Reward yourself for not  smoking.  Explore interactive web-based programs that specialize in helping you quit. GET MEDICINE AND USE IT CORRECTLY Medicines can help you stop smoking and decrease the urge to smoke. Combining medicine with the above behavioral methods and support can greatly increase your chances of successfully quitting smoking.  Nicotine replacement therapy helps deliver nicotine to your body without the negative effects and risks of smoking. Nicotine replacement therapy includes nicotine gum, lozenges, inhalers, nasal sprays, and skin patches. Some may be available over-the-counter and others require a prescription.  Antidepressant medicine helps people abstain from smoking, but how this works is unknown. This medicine is available by prescription.  Nicotinic receptor partial agonist medicine simulates the effect of nicotine in your brain. This medicine is available by prescription. Ask your health care provider for advice about which medicines to use and how to use them based on your health history. Your health care provider will tell you what side effects to look out for if you choose to be on a medicine or therapy. Carefully read the information on the package. Do not use any other product containing nicotine while using a nicotine replacement product.  RELAPSE OR DIFFICULT SITUATIONS Most relapses occur within the first 3 months after quitting. Do not be discouraged if you start smoking again. Remember, most people try several times before finally quitting. You may have symptoms of withdrawal because your body is used to nicotine. You may crave cigarettes, be irritable, feel very hungry, cough often, get headaches, or have difficulty concentrating. The withdrawal symptoms are only temporary. They are strongest when you first quit, but they will go away within 10-14 days. To reduce the chances of relapse, try to:  Avoid drinking alcohol. Drinking lowers your chances of successfully quitting.  Reduce the  amount of caffeine you consume. Once you quit smoking, the amount of caffeine in your body increases and can give you symptoms, such as a rapid heartbeat, sweating, and anxiety.  Avoid smokers because they can make you want to smoke.  Do not let weight gain distract you. Many smokers will gain weight when they quit, usually less than 10 pounds. Eat a healthy diet and stay active. You can always lose the weight gained after you quit.  Find ways to improve your mood other than smoking. FOR MORE INFORMATION  www.smokefree.gov  Document Released: 09/09/2001 Document Revised: 01/30/2014 Document Reviewed: 12/25/2011 ExitCare Patient Information 2015 [employer]. This information is not intended to replace advice given to you by your health care provider. Make sure you discuss any questions you have with your health   care provider.    Smoking Cessation, Tips for Success If you are ready to quit smoking, congratulations! You have chosen to help yourself be healthier. Cigarettes bring nicotine, tar, carbon monoxide, and other irritants into your body. Your lungs, heart, and blood vessels will be able to work better without these poisons. There are many different ways to quit smoking. Nicotine gum, nicotine patches, a nicotine inhaler, or nicotine nasal spray can help with physical craving. Hypnosis, support groups, and medicines help break the habit of smoking. WHAT THINGS CAN I DO TO MAKE QUITTING EASIER?  Here are some tips to help you quit for good:  Pick a date when you will quit smoking completely. Tell all of your friends and family about your plan to quit on that date.  Do not try to slowly cut down on the number of cigarettes you are smoking. Pick a quit date and quit smoking completely starting on that day.  Throw away all cigarettes.   Clean and remove all ashtrays from your home, work, and car.  On a card, write down your reasons for quitting. Carry the card with you and read it  when you get the urge to smoke.  Cleanse your body of nicotine. Drink enough water and fluids to keep your urine clear or pale yellow. Do this after quitting to flush the nicotine from your body.  Learn to predict your moods. Do not let a bad situation be your excuse to have a cigarette. Some situations in your life might tempt you into wanting a cigarette.  Never have "just one" cigarette. It leads to wanting another and another. Remind yourself of your decision to quit.  Change habits associated with smoking. If you smoked while driving or when feeling stressed, try other activities to replace smoking. Stand up when drinking your coffee. Brush your teeth after eating. Sit in a different chair when you read the paper. Avoid alcohol while trying to quit, and try to drink fewer caffeinated beverages. Alcohol and caffeine may urge you to smoke.  Avoid foods and drinks that can trigger a desire to smoke, such as sugary or spicy foods and alcohol.  Ask people who smoke not to smoke around you.  Have something planned to do right after eating or having a cup of coffee. For example, plan to take a walk or exercise.  Try a relaxation exercise to calm you down and decrease your stress. Remember, you may be tense and nervous for the first 2 weeks after you quit, but this will pass.  Find new activities to keep your hands busy. Play with a pen, coin, or rubber band. Doodle or draw things on paper.  Brush your teeth right after eating. This will help cut down on the craving for the taste of tobacco after meals. You can also try mouthwash.   Use oral substitutes in place of cigarettes. Try using lemon drops, carrots, cinnamon sticks, or chewing gum. Keep them handy so they are available when you have the urge to smoke.  When you have the urge to smoke, try deep breathing.  Designate your home as a nonsmoking area.  If you are a heavy smoker, ask your health care provider about a prescription for  nicotine chewing gum. It can ease your withdrawal from nicotine.  Reward yourself. Set aside the cigarette money you save and buy yourself something nice.  Look for support from others. Join a support group or smoking cessation program. Ask someone at home or at work   to help you with your plan to quit smoking.  Always ask yourself, "Do I need this cigarette or is this just a reflex?" Tell yourself, "Today, I choose not to smoke," or "I do not want to smoke." You are reminding yourself of your decision to quit.  Do not replace cigarette smoking with electronic cigarettes (commonly called e-cigarettes). The safety of e-cigarettes is unknown, and some may contain harmful chemicals.  If you relapse, do not give up! Plan ahead and think about what you will do the next time you get the urge to smoke. HOW WILL I FEEL WHEN I QUIT SMOKING? You may have symptoms of withdrawal because your body is used to nicotine (the addictive substance in cigarettes). You may crave cigarettes, be irritable, feel very hungry, cough often, get headaches, or have difficulty concentrating. The withdrawal symptoms are only temporary. They are strongest when you first quit but will go away within 10-14 days. When withdrawal symptoms occur, stay in control. Think about your reasons for quitting. Remind yourself that these are signs that your body is healing and getting used to being without cigarettes. Remember that withdrawal symptoms are easier to treat than the major diseases that smoking can cause.  Even after the withdrawal is over, expect periodic urges to smoke. However, these cravings are generally short lived and will go away whether you smoke or not. Do not smoke! WHAT RESOURCES ARE AVAILABLE TO HELP ME QUIT SMOKING? Your health care provider can direct you to community resources or hospitals for support, which may include:  Group support.  Education.  Hypnosis.  Therapy. Document Released: 06/13/2004 Document  Revised: 01/30/2014 Document Reviewed: 03/03/2013 ExitCare Patient Information 2015 [employer]. This information is not intended to replace advice given to you by your health care provider. Make sure you discuss any questions you have with your health care provider.  

## 2015-04-09 NOTE — Addendum Note (Signed)
Addended by: Dorthula Rue L on: 04/09/2015 02:15 PM   Modules accepted: Orders

## 2015-05-28 ENCOUNTER — Other Ambulatory Visit (HOSPITAL_COMMUNITY): Payer: Self-pay | Admitting: Family Medicine

## 2015-05-28 ENCOUNTER — Other Ambulatory Visit: Payer: Self-pay | Admitting: Adult Health

## 2015-05-28 DIAGNOSIS — Z1231 Encounter for screening mammogram for malignant neoplasm of breast: Secondary | ICD-10-CM

## 2015-06-11 ENCOUNTER — Ambulatory Visit (HOSPITAL_COMMUNITY)
Admission: RE | Admit: 2015-06-11 | Discharge: 2015-06-11 | Disposition: A | Discharge status: Home / Self Care | Payer: 59 | Source: Ambulatory Visit | Attending: Family Medicine | Admitting: Family Medicine

## 2015-06-11 DIAGNOSIS — Z1231 Encounter for screening mammogram for malignant neoplasm of breast: Secondary | ICD-10-CM

## 2015-07-26 DIAGNOSIS — Z23 Encounter for immunization: Secondary | ICD-10-CM

## 2015-07-30 ENCOUNTER — Ambulatory Visit (INDEPENDENT_AMBULATORY_CARE_PROVIDER_SITE_OTHER): Payer: 59 | Admitting: Neurology

## 2015-07-30 ENCOUNTER — Encounter: Payer: Self-pay | Admitting: Neurology

## 2015-07-30 VITALS — BP 115/70 | HR 74 | Ht 64.0 in | Wt 150.0 lb

## 2015-07-30 DIAGNOSIS — IMO0002 Reserved for concepts with insufficient information to code with codable children: Secondary | ICD-10-CM

## 2015-07-30 DIAGNOSIS — G43709 Chronic migraine without aura, not intractable, without status migrainosus: Secondary | ICD-10-CM

## 2015-07-30 MED ORDER — SUMATRIPTAN SUCCINATE 50 MG PO TABS: 50.0000 mg | ORAL_TABLET | ORAL | Status: DC | PRN

## 2015-07-30 MED ORDER — NORTRIPTYLINE HCL 10 MG PO CAPS: ORAL_CAPSULE | ORAL | Status: DC

## 2015-07-30 NOTE — Progress Notes (Signed)
PATIENT: Morgan Davenport DOB: 1960/11/23  Chief Complaint  Patient presents with  . Headache    "[name]" reports getting two severe headaches per week.  She failed OTC NSAIDS and hydrocodone-apap provides her with only temporary relief.     HISTORICAL  Morgan Davenport a 57 right-handed female, seen in refer by her primary care physician Dr. Kelton Pillar, MD in October 31st 2016 for evaluation of headaches   I reviewed and summarized her most recent primary care visit in August 22nd 2016, she had a history of hypertension, hyperlipidemia, diabetes, hepatitis C, was treated with Harovoni, she was admitted to the hospital in March 2011 for prolonged severe bifrontal headaches, I have reviewed four-vessel angiogram report then, less than 40% bilateral internal carotid artery stenosis, no evidence of intracranial artery stenosis.  Laboratory evaluations in 2016 A1c 6.8, normal CMP with exception of elevated glucose 130  She has headaches since 2011, as times goes by, she has more frequent headaches,  MRI brain of brain with without contrast in April 2016 was normal  Sh reported headache twice a week, starting from right cervical region, spreading to right occipital,right retro-orbital, parietal area severe pounding headache was associated light noise sensitivity, nauseous, lasting for few hours,movement make it worse,her headaches improves after sleeping for a few hours.she has tried Aleve, hydrocodone, only provide temporary improvement  Trigger for her headaches are stress, sleep deprivation, sometimes she woke up with a severe headache occasionally with associated vertigo,  REVIEW OF SYSTEMS: Full 14 system review of systems performed and notable only for spinning sensation, blurred vision, headaches, dizziness, depression  ALLERGIES: Allergies  Allergen Reactions  . Tramadol Other (See Comments)    Headache    HOME MEDICATIONS: Current Outpatient Prescriptions  Medication  Sig Dispense Refill  . clonazePAM (KLONOPIN) 0.5 MG tablet Take 0.5 mg by mouth daily as needed for anxiety.     . gabapentin (NEURONTIN) 100 MG capsule Take 100 mg by mouth every morning.     Marland Kitchen glipiZIDE (GLUCOTROL) 5 MG tablet Take 5 mg by mouth 2 (two) times daily.     . hydrochlorothiazide (HYDRODIURIL) 25 MG tablet Take 25 mg by mouth daily.    Marland Kitchen HYDROcodone-acetaminophen (NORCO/VICODIN) 5-325 MG tablet Take 1 tablet by mouth as needed for moderate pain.    Marland Kitchen ibuprofen (ADVIL,MOTRIN) 200 MG tablet Take 400 mg by mouth every 8 (eight) hours as needed for pain.     Marland Kitchen losartan (COZAAR) 50 MG tablet Take 50 mg by mouth daily.    . meclizine (ANTIVERT) 50 MG tablet Take 1 tablet (50 mg total) by mouth 3 (three) times daily as needed. 30 tablet 0  . Multiple Vitamin (MULITIVITAMIN WITH MINERALS) TABS Take 1 tablet by mouth daily.    . sertraline (ZOLOFT) 100 MG tablet Take 100 mg by mouth daily.     . simvastatin (ZOCOR) 10 MG tablet Take 10 mg by mouth daily.     No current facility-administered medications for this visit.    PAST MEDICAL HISTORY: Past Medical History  Diagnosis Date  . Hypertension   . Diabetes mellitus   . Carotid artery occlusion   . Depression   . Headache     PAST SURGICAL HISTORY: Past Surgical History  Procedure Laterality Date  . Abdominal hysterectomy  Dec. 24, 2008    FAMILY HISTORY: Family History  Problem Relation Age of Onset  . Diabetes Mother   . Hypertension Mother   . Hyperlipidemia Mother   .  Heart disease Mother     NOT before age 81  . Heart attack Mother   . Stomach cancer Father     SOCIAL HISTORY:  Social History   Social History  . Marital Status: Single    Spouse Name: N/A  . Number of Children: 1  . Years of Education: HS   Occupational History  . [employer]  . 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 Smoking status: Light Tobacco Smoker -- 0.50 packs/day for 10 years    Types: Cigarettes    . Smokeless tobacco: Never Used  . Alcohol Use: No  . Drug Use: No  . Sexual Activity: Not on file   Other Topics Concern  . Not on file   Social History Narrative   Lives at home with fiance.   Right-handed.   1-2 cups caffeine per day.        PHYSICAL EXAM   Filed Vitals:   07/30/15 1414  BP: 115/70  Pulse: 74  Height: 5\' 4"  (1.626 m)  Weight: 150 lb (68.04 kg)    Not recorded      Body mass index is 25.73 kg/(m^2).  PHYSICAL EXAMNIATION:  Gen: NAD, conversant, well nourised, obese, well groomed                     Cardiovascular: Regular rate rhythm, no peripheral edema, warm, nontender. Eyes: Conjunctivae clear without exudates or hemorrhage Neck: Supple, no carotid bruise. Pulmonary: Clear to auscultation bilaterally   NEUROLOGICAL EXAM:  MENTAL STATUS: Speech:    Speech is normal; fluent and spontaneous with normal comprehension.  Cognition:     Orientation to time, place and person     Normal recent and remote memory     Normal Attention span and concentration     Normal Language, naming, repeating,spontaneous speech     Fund of knowledge   CRANIAL NERVES: CN II: Visual fields are full to confrontation. Fundoscopic exam is normal with sharp discs and no vascular changes. Pupils are round equal and briskly reactive to light. CN III, IV, VI: extraocular movement are normal. No ptosis. CN V: Facial sensation is intact to pinprick in all 3 divisions bilaterally. Corneal responses are intact.  CN VII: Face is symmetric with normal eye closure and smile. CN VIII: Hearing is normal to rubbing fingers CN IX, X: Palate elevates symmetrically. Phonation is normal. CN XI: Head turning and shoulder shrug are intact CN XII: Tongue is midline with normal movements and no atrophy.  MOTOR: There is no pronator drift of out-stretched arms. Muscle bulk and tone are normal. Muscle strength is normal.  REFLEXES: Reflexes are 2+ and symmetric at the biceps,  triceps, knees, and ankles. Plantar responses are flexor.  SENSORY: Intact to light touch, pinprick, position sense, and vibration sense are intact in fingers and toes.  COORDINATION: Rapid alternating movements and fine finger movements are intact. There is no dysmetria on finger-to-nose and heel-knee-shin.    GAIT/STANCE: Posture is normal. Gait is steady with normal steps, base, arm swing, and turning. Heel and toe walking are normal. Tandem gait is normal.  Romberg is absent.   DIAGNOSTIC DATA (LABS, IMAGING, TESTING) - I reviewed patient records, labs, notes, testing and imaging myself where available.   ASSESSMENT AND PLAN  Kelbi Renstrom is a 54 y.o. female   Chronic migraine  Start preventive medications nortriptyline 10 mg, titrating to 20  mg every night  Imitrex 50 mg as needed History of hepatitis C, status post Harvorni treatment.    Marcial Pacas, M.D. Ph.D.  [facility] [address] [place], [place] [address] Ph: [phone] Fax: [phone]  CC: Kelton Pillar, MD

## 2015-08-09 NOTE — Congregational Nurse Program (Signed)
Congregational Nurse Program Note  Date of Encounter: 07/26/2015  Past Medical History: Past Medical History  Diagnosis Date  . Hypertension   . Diabetes mellitus   . Carotid artery occlusion   . Depression   . Headache     Encounter Details:     CNP Questionnaire - 08/09/15 1903    Patient Demographics   Is this a new or existing patient? New   Patient is considered a/an Not Applicable   Patient Assistance   Patient's financial/insurance status Low Income   Patient referred to apply for the following financial assistance Not Applicable   Food insecurities addressed Provided food supplies   Transportation assistance No   Assistance securing medications No   Educational health offerings Not Applicable   Encounter Details   Primary purpose of visit Other (comment)  flu immunization   Was an Emergency Department visit averted? Not Applicable   Does patient have a medical provider? No   Patient referred to Not Applicable   Was a mental health screening completed? (GAINS tool) No   Does patient have dental issues? No   Since previous encounter, have you referred patient for abnormal blood pressure that resulted in a new diagnosis or medication change? No   Since previous encounter, have you referred patient for abnormal blood glucose that resulted in a new diagnosis or medication change? No       Clinic visit for flu vaccine

## 2015-08-26 DIAGNOSIS — Z139 Encounter for screening, unspecified: Secondary | ICD-10-CM

## 2015-10-02 ENCOUNTER — Encounter: Payer: Self-pay | Admitting: Neurology

## 2015-10-02 ENCOUNTER — Ambulatory Visit (INDEPENDENT_AMBULATORY_CARE_PROVIDER_SITE_OTHER): Payer: 59 | Admitting: Neurology

## 2015-10-02 VITALS — BP 130/77 | HR 74 | Ht 64.0 in | Wt 149.0 lb

## 2015-10-02 DIAGNOSIS — G43709 Chronic migraine without aura, not intractable, without status migrainosus: Secondary | ICD-10-CM | POA: Diagnosis not present

## 2015-10-02 DIAGNOSIS — IMO0002 Reserved for concepts with insufficient information to code with codable children: Secondary | ICD-10-CM | POA: Insufficient documentation

## 2015-10-02 MED ORDER — RIZATRIPTAN BENZOATE 5 MG PO TBDP: 5.0000 mg | ORAL_TABLET | ORAL | Status: DC | PRN

## 2015-10-02 NOTE — Progress Notes (Signed)
Chief Complaint  Patient presents with  . Migraine    She is only taking 10mg  of nortriptyline at bedtime which has improved her headache frequency.  She felt 20mg  was causing her hair loss.  She is unable to tolerate sumatriptan due to it causing a "racing heart".      PATIENT: Morgan Davenport DOB: 1961-07-17  Chief Complaint  Patient presents with  . Migraine    She is only taking 10mg  of nortriptyline at bedtime which has improved her headache frequency.  She felt 20mg  was causing her hair loss.  She is unable to tolerate sumatriptan due to it causing a "racing heart".     HISTORICAL  Morgan Davenport a 27 right-handed female, seen in refer by her primary care physician Dr. Kelton Pillar, MD in October 31st 2016 for evaluation of headaches   I reviewed and summarized her most recent primary care visit in August 22nd 2016, she had a history of hypertension, hyperlipidemia, diabetes, hepatitis C, was treated with Harovoni, she was admitted to the hospital in March 2011 for prolonged severe bifrontal headaches, I have reviewed four-vessel angiogram report then, less than 40% bilateral internal carotid artery stenosis, no evidence of intracranial artery stenosis.  Laboratory evaluations in 2016 A1c 6.8, normal CMP with exception of elevated glucose 130  She has headaches since 2011, as times goes by, she has more frequent headaches,  MRI brain of brain with without contrast in April 2016 was normal  Sh reported headache twice a week, starting from right cervical region, spreading to right occipital,right retro-orbital, parietal area severe pounding headache was associated light noise sensitivity, nauseous, lasting for few hours,movement make it worse,her headaches improves after sleeping for a few hours.she has tried Aleve, hydrocodone, only provide temporary improvement  Trigger for her headaches are stress, sleep deprivation, sometimes she woke up with a severe  headache occasionally with associated vertigo,  UPDATE Oct 02 2015: She is now taking nortriptyline 10 mg every night, which did help her headache, she complains of hair loss while taking 2 tablets   She also tried Imitrex 50 mg as needed, which did take away her headache in 30 minutes, but she felt heart racing fast, her hand, felt funny, she does not like the side effect of Imitrex.  She does have nausea light, noise sensitivity during her typical migraines, she has migraines about once a week, each episode lasts about few hours, sometimes she take hydrocodone as needed for headaches  REVIEW OF SYSTEMS: Full 14 system review of systems performed and notable only for headaches, moles,  ALLERGIES: Allergies  Allergen Reactions  . Tramadol Other (See Comments)    Headache    HOME MEDICATIONS: Current Outpatient Prescriptions  Medication Sig Dispense Refill  . clonazePAM (KLONOPIN) 0.5 MG tablet Take 0.5 mg by mouth daily as needed for anxiety.     . gabapentin (NEURONTIN) 100 MG capsule Take 100 mg by mouth every morning.     Marland Kitchen glipiZIDE (GLUCOTROL) 5 MG tablet Take 5 mg by mouth 2 (two) times daily.     . hydrochlorothiazide (HYDRODIURIL) 25 MG tablet Take 25 mg by mouth daily.    Marland Kitchen HYDROcodone-acetaminophen (NORCO/VICODIN) 5-325 MG tablet Take 1 tablet by mouth as needed for moderate pain.    Marland Kitchen losartan (COZAAR) 50 MG tablet Take 50 mg by mouth daily.    . meclizine (ANTIVERT) 50 MG tablet Take 1 tablet (50 mg total) by mouth 3 (three) times daily as needed. [place]  tablet 0  . Multiple Vitamin (MULITIVITAMIN WITH MINERALS) TABS Take 1 tablet by mouth daily.    . nortriptyline (PAMELOR) 10 MG capsule 1 tablet every night for one week, then 2 tablets every night 60 capsule 6  . sertraline (ZOLOFT) 100 MG tablet Take 100 mg by mouth daily.     . simvastatin (ZOCOR) 10 MG tablet Take 10 mg by mouth daily.     No current facility-administered medications for this visit.    PAST MEDICAL  HISTORY: Past Medical History  Diagnosis Date  . Hypertension   . Diabetes mellitus   . Carotid artery occlusion   . Depression   . Headache     PAST SURGICAL HISTORY: Past Surgical History  Procedure Laterality Date  . Abdominal hysterectomy  Dec. 24, 2008    FAMILY HISTORY: Family History  Problem Relation Age of Onset  . Diabetes Mother   . Hypertension Mother   . Hyperlipidemia Mother   . Heart disease Mother     NOT before age 11  . Heart attack Mother   . Stomach cancer Father     SOCIAL HISTORY:  Social History   Social History  . Marital Status: Single    Spouse Name: N/A  . Number of Children: 1  . Years of Education: HS   Occupational History  . [employer]  . 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 Smoking status: Light Tobacco Smoker -- 0.50 packs/day for 10 years    Types: Cigarettes  . Smokeless tobacco: Never Used  . Alcohol Use: No  . Drug Use: No  . Sexual Activity: Not on file   Other Topics Concern  . Not on file   Social History Narrative   Lives at home with fiance.   Right-handed.   1-2 cups caffeine per day.        PHYSICAL EXAM   Filed Vitals:   10/02/15 0945  BP: 130/77  Pulse: 74  Height: 5\' 4"  (1.626 m)  Weight: 149 lb (67.586 kg)    Not recorded      Body mass index is 25.56 kg/(m^2).  PHYSICAL EXAMNIATION:  Gen: NAD, conversant, well nourised, obese, well groomed                     Cardiovascular: Regular rate rhythm, no peripheral edema, warm, nontender. Eyes: Conjunctivae clear without exudates or hemorrhage Neck: Supple, no carotid bruise. Pulmonary: Clear to auscultation bilaterally   NEUROLOGICAL EXAM:  MENTAL STATUS: Speech:    Speech is normal; fluent and spontaneous with normal comprehension.  Cognition:     Orientation to time, place and person     Normal recent and remote memory     Normal Attention span and concentration     Normal Language, naming,  repeating,spontaneous speech     Fund of knowledge   CRANIAL NERVES: CN II: Visual fields are full to confrontation. Fundoscopic exam is normal with sharp discs and no vascular changes. Pupils are round equal and briskly reactive to light. CN III, IV, VI: extraocular movement are normal. No ptosis. CN V: Facial sensation is intact to pinprick in all 3 divisions bilaterally. Corneal responses are intact.  CN VII: Face is symmetric with normal eye closure and smile. CN VIII: Hearing is normal to rubbing fingers CN IX, X: Palate elevates symmetrically. Phonation is normal. CN XI: Head turning and shoulder shrug are intact CN  XII: Tongue is midline with normal movements and no atrophy.  MOTOR: There is no pronator drift of out-stretched arms. Muscle bulk and tone are normal. Muscle strength is normal.  REFLEXES: Reflexes are 2+ and symmetric at the biceps, triceps, knees, and ankles. Plantar responses are flexor.  SENSORY: Intact to light touch, pinprick, position sense, and vibration sense are intact in fingers and toes.  COORDINATION: Rapid alternating movements and fine finger movements are intact. There is no dysmetria on finger-to-nose and heel-knee-shin.    GAIT/STANCE: Posture is normal. Gait is steady with normal steps, base, arm swing, and turning. Heel and toe walking are normal. Tandem gait is normal.  Romberg is absent.   DIAGNOSTIC DATA (LABS, IMAGING, TESTING) - I reviewed patient records, labs, notes, testing and imaging myself where available.   ASSESSMENT AND PLAN  Masako "[name]" Advitha Finton is a 55 y.o. female   Chronic migraine  Continue preventive medications nortriptyline 10 mg daily  Maxalt 5 mg as needed   History of hepatitis C, status post Harvorni treatment.    Marcial Pacas, M.D. Ph.D.  [facility] [address] [place], [place] [address] Ph: [phone] Fax: [phone]  CC: Kelton Pillar, MD

## 2015-10-04 ENCOUNTER — Emergency Department (HOSPITAL_COMMUNITY): Payer: 59

## 2015-10-04 ENCOUNTER — Encounter (HOSPITAL_COMMUNITY): Payer: Self-pay | Admitting: *Deleted

## 2015-10-04 ENCOUNTER — Emergency Department (HOSPITAL_COMMUNITY)
Admission: EM | Admit: 2015-10-04 | Discharge: 2015-10-04 | Discharge status: Against Medical Advice | Payer: 59 | Attending: Emergency Medicine | Admitting: Emergency Medicine

## 2015-10-04 DIAGNOSIS — F17210 Nicotine dependence, cigarettes, uncomplicated: Secondary | ICD-10-CM | POA: Diagnosis not present

## 2015-10-04 DIAGNOSIS — R079 Chest pain, unspecified: Secondary | ICD-10-CM | POA: Diagnosis not present

## 2015-10-04 DIAGNOSIS — E119 Type 2 diabetes mellitus without complications: Secondary | ICD-10-CM | POA: Insufficient documentation

## 2015-10-04 DIAGNOSIS — R0602 Shortness of breath: Secondary | ICD-10-CM | POA: Insufficient documentation

## 2015-10-04 DIAGNOSIS — F419 Anxiety disorder, unspecified: Secondary | ICD-10-CM | POA: Diagnosis not present

## 2015-10-04 DIAGNOSIS — F329 Major depressive disorder, single episode, unspecified: Secondary | ICD-10-CM | POA: Insufficient documentation

## 2015-10-04 DIAGNOSIS — Z79899 Other long term (current) drug therapy: Secondary | ICD-10-CM | POA: Diagnosis not present

## 2015-10-04 DIAGNOSIS — I10 Essential (primary) hypertension: Secondary | ICD-10-CM | POA: Insufficient documentation

## 2015-10-04 DIAGNOSIS — R0789 Other chest pain: Secondary | ICD-10-CM | POA: Diagnosis present

## 2015-10-04 LAB — BASIC METABOLIC PANEL
Anion gap: 10 (ref 5–15)
BUN: 12 mg/dL (ref 6–20)
CO2: 31 mmol/L (ref 22–32)
Calcium: 10.3 mg/dL (ref 8.9–10.3)
Chloride: 98 mmol/L — ABNORMAL LOW (ref 101–111)
Creatinine, Ser: 0.97 mg/dL (ref 0.44–1.00)
GFR calc Af Amer: >60 mL/min (ref >60)
GFR calc non Af Amer: >60 mL/min (ref >60)
Glucose, Bld: 89 mg/dL (ref 65–99)
Potassium: 3.8 mmol/L (ref 3.5–5.1)
Sodium: 139 mmol/L (ref 135–145)

## 2015-10-04 LAB — CBC
HCT: 38.8 % (ref 36.0–46.0)
Hemoglobin: 12.7 g/dL (ref 12.0–15.0)
MCH: 27.7 pg (ref 26.0–34.0)
MCHC: 32.7 g/dL (ref 30.0–36.0)
MCV: 84.5 fL (ref 78.0–100.0)
Platelets: 308 10*3/uL (ref 150–400)
RBC: 4.59 MIL/uL (ref 3.87–5.11)
RDW: 13.4 % (ref 11.5–15.5)
WBC: 12.6 10*3/uL — ABNORMAL HIGH (ref 4.0–10.5)

## 2015-10-04 LAB — I-STAT TROPONIN, ED: Troponin i, poc: 0 ng/mL (ref 0.00–0.08)

## 2015-10-04 NOTE — ED Notes (Signed)
Pt c/o cp since 1800. Also c/o shortness of breath. Denies history.

## 2015-10-04 NOTE — ED Notes (Signed)
Pt here for evaluation of shortness of breath and chest pain that occurred earlier at rest. No distress at present. No pain currently.

## 2015-10-04 NOTE — ED Provider Notes (Signed)
CSN: [id number]     Arrival date & time 10/04/15  1847 History   First MD Initiated Contact with Patient 10/04/15 2111     Chief Complaint  Patient presents with  . Chest Pain     (Consider location/radiation/quality/duration/timing/severity/associated sxs/prior Treatment) HPI Comments: This a 55 year old African-American female with a history of diabetes, hypertension, carotid artery stenosis and depression who presents with 3 hours of substernal chest discomfort/pressure and shortness of breath.  Denies any diaphoresis, nausea pain does not radiate. She states the pain started in her central chest was pressure like somebody is pushing on her chest with a burning sensation into this esophageal throat area. She reports that her sugar is well controlled with oral agents as well as her blood pressure.  She sees her primary care physician every 6 months.  No changes in medication have been prescribed.  Today's activity included her normal routine work followed by a visit to a nursing home to see her step father and home for dinner she had decreased appetite, which she reports that she's had decreased appetite for the past 2-3 weeks, but has had persistent pain decided to come to the emergency department for evaluation.  Patient is a 55 y.o. female presenting with chest pain. The history is provided by the patient.  Chest Pain Pain location:  Substernal area Pain quality: burning   Pain radiates to:  Does not radiate Pain radiates to the back: no   Pain severity:  Moderate Onset quality:  Gradual Duration:  3 hours Timing:  Constant Progression:  Unchanged Chronicity:  New Context: breathing and at rest   Context: not eating and no movement   Relieved by:  None tried Worsened by:  Nothing tried Ineffective treatments:  None tried Associated symptoms: anxiety and shortness of breath   Associated symptoms: no abdominal pain, no back pain, no cough, no diaphoresis, no dizziness, no fever,  no nausea, no numbness, no palpitations, no syncope, not vomiting and no weakness   Shortness of breath:    Severity:  Mild   Onset quality:  Unable to specify   Duration:  3 hours   Timing:  Constant   Progression:  Unchanged Risk factors: diabetes mellitus, hypertension and smoking     Past Medical History  Diagnosis Date  . Hypertension   . Diabetes mellitus   . Carotid artery occlusion   . Depression   . Headache    Past Surgical History  Procedure Laterality Date  . Abdominal hysterectomy  Dec. 24, 2008   Family History  Problem Relation Age of Onset  . Diabetes Mother   . Hypertension Mother   . Hyperlipidemia Mother   . Heart disease Mother     NOT before age 17  . Heart attack Mother   . Stomach cancer Father    Social History  Substance Use Topics  . Smoking status: Light Tobacco Smoker -- 0.50 packs/day for 10 years    Types: Cigarettes  . Smokeless tobacco: Never Used  . Alcohol Use: No   OB History    No data available     Review of Systems  Constitutional: Negative for fever and diaphoresis.  Respiratory: Positive for chest tightness and shortness of breath. Negative for cough, choking and wheezing.   Cardiovascular: Positive for chest pain. Negative for palpitations, leg swelling and syncope.  Gastrointestinal: Negative for nausea, vomiting and abdominal pain.  Musculoskeletal: Negative for back pain.  Neurological: Negative for dizziness, weakness and numbness.  All other systems  reviewed and are negative.     Allergies  Tramadol  Home Medications   Prior to Admission medications   Medication Sig Start Date End Date Taking? Authorizing Provider  clonazePAM (KLONOPIN) 0.5 MG tablet Take 0.5 mg by mouth daily as needed for anxiety.  03/09/13  Yes Historical Provider, MD  gabapentin (NEURONTIN) 100 MG capsule Take 100 mg by mouth every morning.    Yes Historical Provider, MD  glipiZIDE (GLUCOTROL) 5 MG tablet Take 5 mg by mouth 2 (two) times  daily.  03/09/13  Yes Historical Provider, MD  hydrochlorothiazide (HYDRODIURIL) 25 MG tablet Take 25 mg by mouth daily.   Yes Historical Provider, MD  HYDROcodone-acetaminophen (NORCO/VICODIN) 5-325 MG tablet Take 1 tablet by mouth every 6 (six) hours as needed for moderate pain.    Yes Historical Provider, MD  losartan (COZAAR) 50 MG tablet Take 50 mg by mouth daily.   Yes Historical Provider, MD  meclizine (ANTIVERT) 50 MG tablet Take 1 tablet (50 mg total) by mouth 3 (three) times daily as needed. 05/11/13  Yes Carlisle Cater, PA-C  Multiple Vitamin (MULITIVITAMIN WITH MINERALS) TABS Take 1 tablet by mouth daily.   Yes Historical Provider, MD  nortriptyline (PAMELOR) 10 MG capsule 1 tablet every night for one week, then 2 tablets every night Patient taking differently: Take 10 mg by mouth daily. 1 tablet every night for one week, then 2 tablets every night 07/30/15  Yes Marcial Pacas, MD  sertraline (ZOLOFT) 100 MG tablet Take 100 mg by mouth daily.    Yes Historical Provider, MD  simvastatin (ZOCOR) 10 MG tablet Take 10 mg by mouth daily.   Yes Historical Provider, MD  rizatriptan (MAXALT-MLT) 5 MG disintegrating tablet Take 1 tablet (5 mg total) by mouth as needed. May repeat in 2 hours if needed Patient not taking: Reported on 10/04/2015 10/02/15   Marcial Pacas, MD   BP 122/75 mmHg  Pulse 73  Temp(Src) 97.4 F (36.3 C) (Oral)  Resp 18  SpO2 98% Physical Exam  Constitutional: She appears well-developed and well-nourished.  HENT:  Head: Normocephalic.  Eyes: Pupils are equal, round, and reactive to light.  Neck: Normal range of motion.  Cardiovascular: Normal rate and regular rhythm.   Pulmonary/Chest: Effort normal.  Abdominal: Soft.  Musculoskeletal: She exhibits no edema or tenderness.  Nursing note and vitals reviewed.   ED Course  Procedures (including critical care time) Labs Review Labs Reviewed  BASIC METABOLIC PANEL - Abnormal; Notable for the following:    Chloride 98 (*)     All other components within normal limits  CBC - Abnormal; Notable for the following:    WBC 12.6 (*)    All other components within normal limits  I-STAT TROPOININ, ED  Randolm Idol, ED    Imaging Review Dg Chest 2 View  10/04/2015  CLINICAL DATA:  Shortness of breath and chest pressure on the left today. EXAM: CHEST  2 VIEW COMPARISON:  December 11, 2009 FINDINGS: The heart size and mediastinal contours are within normal limits. There is minimal atelectasis of the left lung base. There is no focal infiltrate, pulmonary edema, or pleural effusion. The visualized skeletal structures are unremarkable. IMPRESSION: No active cardiopulmonary disease. Minimal atelectasis of left lung base. Electronically Signed   By: Abelardo Diesel M.D.   On: 10/04/2015 19:25   I have personally reviewed and evaluated these images and lab results as part of my medical decision-making.   EKG Interpretation   Date/Time:  Thursday October 04 2015 18:48:43 EST Ventricular Rate:  77 PR Interval:  170 QRS Duration: 68 QT Interval:  370 QTC Calculation: 418 R Axis:   82 Text Interpretation:  Normal sinus rhythm Possible Left atrial enlargement  Septal infarct , age undetermined Abnormal ECG No significant change since  last tracing Confirmed by [place]ot ([id number]) on 10/04/2015  10:09:20 PM     patient denies any lower extremity swelling.  States he has not had any travel where she sat for long periods of time.  One risk factor for DVT/PE is smoking PERC score Left without notification prior to second troponin and completion of evaluation  MDM   Final diagnoses:  Chest pain, unspecified chest pain type         Junius Creamer, NP 10/04/15 2318  Ripley Fraise, MD 10/05/15 1310

## 2015-10-04 NOTE — ED Notes (Signed)
Pt left AMA °

## 2015-11-26 NOTE — Congregational Nurse Program (Unsigned)
Congregational Nurse Program Note  Date of Encounter: 08/26/2015  Past Medical History: Past Medical History  Diagnosis Date  . Hypertension   . Diabetes mellitus   . Carotid artery occlusion   . Depression   . Headache     Encounter Details: B/P check

## 2016-03-13 DIAGNOSIS — Z139 Encounter for screening, unspecified: Secondary | ICD-10-CM

## 2016-04-03 LAB — GLUCOSE, POCT (MANUAL RESULT ENTRY): POC Glucose: 123 mg/dl — AB (ref 70–99)

## 2016-04-03 NOTE — Congregational Nurse Program (Signed)
Congregational Nurse Program Note  Date of Encounter: 03/13/2016  Past Medical History: Past Medical History  Diagnosis Date  . Hypertension   . Diabetes mellitus   . Carotid artery occlusion   . Depression   . Headache     Encounter Details:     CNP Questionnaire - 03/13/16 1405    Patient Demographics   Is this a new or existing patient? Existing   Patient is considered a/an Not Applicable   Race American Indian/[place] Native   Patient Assistance   Location of Patient Assistance Not Applicable   Patient's financial/insurance status Low Income;Medicaid   Uninsured Patient No   Patient referred to apply for the following financial assistance Not Applicable   Food insecurities addressed Provided food supplies   Transportation assistance No   Assistance securing medications No   Educational health offerings Diabetes;Nutrition   Encounter Details   Primary purpose of visit Education/Health Concerns  flu immunization   Was an Emergency Department visit averted? Not Applicable   Does patient have a medical provider? No   Patient referred to Not Applicable   Was a mental health screening completed? (GAINS tool) No   Does patient have dental issues? No   Does patient have vision issues? No   Does your patient have an abnormal blood pressure today? No   Since previous encounter, have you referred patient for abnormal blood pressure that resulted in a new diagnosis or medication change? No   Does your patient have an abnormal blood glucose today? No   Since previous encounter, have you referred patient for abnormal blood glucose that resulted in a new diagnosis or medication change? No   Was there a life-saving intervention made? No       Requested B/P and CBG.  Encouraged client to return to clinic next week for a re-check of CBG

## 2016-04-07 ENCOUNTER — Ambulatory Visit: Payer: 59 | Admitting: Nurse Practitioner

## 2016-04-10 ENCOUNTER — Encounter: Payer: Self-pay | Admitting: Family

## 2016-04-14 ENCOUNTER — Ambulatory Visit (INDEPENDENT_AMBULATORY_CARE_PROVIDER_SITE_OTHER): Payer: BLUE CROSS/BLUE SHIELD | Admitting: Family

## 2016-04-14 ENCOUNTER — Encounter: Payer: Self-pay | Admitting: Family

## 2016-04-14 ENCOUNTER — Ambulatory Visit (HOSPITAL_COMMUNITY)
Admission: RE | Admit: 2016-04-14 | Discharge: 2016-04-14 | Disposition: A | Discharge status: Home / Self Care | Payer: BLUE CROSS/BLUE SHIELD | Source: Ambulatory Visit | Attending: Family | Admitting: Family

## 2016-04-14 VITALS — BP 131/71 | HR 63 | Temp 97.3°F | Resp 18 | Ht 64.0 in | Wt 148.6 lb

## 2016-04-14 DIAGNOSIS — I6521 Occlusion and stenosis of right carotid artery: Secondary | ICD-10-CM

## 2016-04-14 DIAGNOSIS — F329 Major depressive disorder, single episode, unspecified: Secondary | ICD-10-CM | POA: Diagnosis not present

## 2016-04-14 DIAGNOSIS — F17200 Nicotine dependence, unspecified, uncomplicated: Secondary | ICD-10-CM

## 2016-04-14 DIAGNOSIS — Z720 Tobacco use: Secondary | ICD-10-CM

## 2016-04-14 DIAGNOSIS — E119 Type 2 diabetes mellitus without complications: Secondary | ICD-10-CM | POA: Diagnosis not present

## 2016-04-14 DIAGNOSIS — I6523 Occlusion and stenosis of bilateral carotid arteries: Secondary | ICD-10-CM | POA: Diagnosis not present

## 2016-04-14 DIAGNOSIS — I10 Essential (primary) hypertension: Secondary | ICD-10-CM | POA: Insufficient documentation

## 2016-04-14 LAB — VAS US CAROTID
LEFT ECA DIAS: -9 cm/s
LEFT VERTEBRAL DIAS: -26 cm/s
Left CCA dist dias: 26 cm/s
Left CCA dist sys: 68 cm/s
Left CCA prox dias: 37 cm/s
Left CCA prox sys: 140 cm/s
Left ICA dist dias: -62 cm/s
Left ICA dist sys: -152 cm/s
Left ICA prox dias: -37 cm/s
Left ICA prox sys: -65 cm/s
RIGHT CCA MID DIAS: -26 cm/s
RIGHT ECA DIAS: -13 cm/s
Right CCA prox dias: -20 cm/s
Right CCA prox sys: -97 cm/s
Right cca dist sys: -105 cm/s

## 2016-04-14 NOTE — Patient Instructions (Signed)
Stroke Prevention Some medical conditions and behaviors are associated with an increased chance of having a stroke. You may prevent a stroke by making healthy choices and managing medical conditions. HOW CAN I REDUCE MY RISK OF HAVING A STROKE?   Stay physically active. Get at least 30 minutes of activity on most or all days.  Do not smoke. It may also be helpful to avoid exposure to secondhand smoke.  Limit alcohol use. Moderate alcohol use is considered to be:  No more than 2 drinks per day for men.  No more than 1 drink per day for nonpregnant women.  Eat healthy foods. This involves:  Eating 5 or more servings of fruits and vegetables a day.  Making dietary changes that address high blood pressure (hypertension), high cholesterol, diabetes, or obesity.  Manage your cholesterol levels.  Making food choices that are high in fiber and low in saturated fat, trans fat, and cholesterol may control cholesterol levels.  Take any prescribed medicines to control cholesterol as directed by your health care provider.  Manage your diabetes.  Controlling your carbohydrate and sugar intake is recommended to manage diabetes.  Take any prescribed medicines to control diabetes as directed by your health care provider.  Control your hypertension.  Making food choices that are low in salt (sodium), saturated fat, trans fat, and cholesterol is recommended to manage hypertension.  Ask your health care provider if you need treatment to lower your blood pressure. Take any prescribed medicines to control hypertension as directed by your health care provider.  If you are 18-39 years of age, have your blood pressure checked every 3-5 years. If you are 40 years of age or older, have your blood pressure checked every year.  Maintain a healthy weight.  Reducing calorie intake and making food choices that are low in sodium, saturated fat, trans fat, and cholesterol are recommended to manage  weight.  Stop drug abuse.  Avoid taking birth control pills.  Talk to your health care provider about the risks of taking birth control pills if you are over 35 years old, smoke, get migraines, or have ever had a blood clot.  Get evaluated for sleep disorders (sleep apnea).  Talk to your health care provider about getting a sleep evaluation if you snore a lot or have excessive sleepiness.  Take medicines only as directed by your health care provider.  For some people, aspirin or blood thinners (anticoagulants) are helpful in reducing the risk of forming abnormal blood clots that can lead to stroke. If you have the irregular heart rhythm of atrial fibrillation, you should be on a blood thinner unless there is a good reason you cannot take them.  Understand all your medicine instructions.  Make sure that other conditions (such as anemia or atherosclerosis) are addressed. SEEK IMMEDIATE MEDICAL CARE IF:   You have sudden weakness or numbness of the face, arm, or leg, especially on one side of the body.  Your face or eyelid droops to one side.  You have sudden confusion.  You have trouble speaking (aphasia) or understanding.  You have sudden trouble seeing in one or both eyes.  You have sudden trouble walking.  You have dizziness.  You have a loss of balance or coordination.  You have a sudden, severe headache with no known cause.  You have new chest pain or an irregular heartbeat. Any of these symptoms may represent a serious problem that is an emergency. Do not wait to see if the symptoms will   go away. Get medical help at once. Call your local emergency services (911 in [place] Do not drive yourself to the hospital.   This information is not intended to replace advice given to you by your health care provider. Make sure you discuss any questions you have with your health care provider.   Document Released: 10/23/2004 Document Revised: 10/06/2014 Document Reviewed:  03/18/2013 Elsevier Interactive Patient Education 2016 Elsevier Inc.     Steps to Quit Smoking  Smoking tobacco can be harmful to your health and can affect almost every organ in your body. Smoking puts you, and those around you, at risk for developing many serious chronic diseases. Quitting smoking is difficult, but it is one of the best things that you can do for your health. It is never too late to quit. WHAT ARE THE BENEFITS OF QUITTING SMOKING? When you quit smoking, you lower your risk of developing serious diseases and conditions, such as:  Lung cancer or lung disease, such as COPD.  Heart disease.  Stroke.  Heart attack.  Infertility.  Osteoporosis and bone fractures. Additionally, symptoms such as coughing, wheezing, and shortness of breath may get better when you quit. You may also find that you get sick less often because your body is stronger at fighting off colds and infections. If you are pregnant, quitting smoking can help to reduce your chances of having a baby of low birth weight. HOW DO I GET READY TO QUIT? When you decide to quit smoking, create a plan to make sure that you are successful. Before you quit:  Pick a date to quit. Set a date within the next two weeks to give you time to prepare.  Write down the reasons why you are quitting. Keep this list in places where you will see it often, such as on your bathroom mirror or in your car or wallet.  Identify the people, places, things, and activities that make you want to smoke (triggers) and avoid them. Make sure to take these actions:  Throw away all cigarettes at home, at work, and in your car.  Throw away smoking accessories, such as ashtrays and lighters.  Clean your car and make sure to empty the ashtray.  Clean your home, including curtains and carpets.  Tell your family, friends, and coworkers that you are quitting. Support from your loved ones can make quitting easier.  Talk with your health care  provider about your options for quitting smoking.  Find out what treatment options are covered by your health insurance. WHAT STRATEGIES CAN I USE TO QUIT SMOKING?  Talk with your healthcare provider about different strategies to quit smoking. Some strategies include:  Quitting smoking altogether instead of gradually lessening how much you smoke over a period of time. Research shows that quitting "cold turkey" is more successful than gradually quitting.  Attending in-person counseling to help you build problem-solving skills. You are more likely to have success in quitting if you attend several counseling sessions. Even short sessions of 10 minutes can be effective.  Finding resources and support systems that can help you to quit smoking and remain smoke-free after you quit. These resources are most helpful when you use them often. They can include:  Online chats with a counselor.  Telephone quitlines.  Printed self-help materials.  Support groups or group counseling.  Text messaging programs.  Mobile phone applications.  Taking medicines to help you quit smoking. (If you are pregnant or breastfeeding, talk with your health care provider first.) Some   medicines contain nicotine and some do not. Both types of medicines help with cravings, but the medicines that include nicotine help to relieve withdrawal symptoms. Your health care provider may recommend:  Nicotine patches, gum, or lozenges.  Nicotine inhalers or sprays.  Non-nicotine medicine that is taken by mouth. Talk with your health care provider about combining strategies, such as taking medicines while you are also receiving in-person counseling. Using these two strategies together makes you more likely to succeed in quitting than if you used either strategy on its own. If you are pregnant or breastfeeding, talk with your health care provider about finding counseling or other support strategies to quit smoking. Do not take  medicine to help you quit smoking unless told to do so by your health care provider. WHAT THINGS CAN I DO TO MAKE IT EASIER TO QUIT? Quitting smoking might feel overwhelming at first, but there is a lot that you can do to make it easier. Take these important actions:  Reach out to your family and friends and ask that they support and encourage you during this time. Call telephone quitlines, reach out to support groups, or work with a counselor for support.  Ask people who smoke to avoid smoking around you.  Avoid places that trigger you to smoke, such as bars, parties, or smoke-break areas at work.  Spend time around people who do not smoke.  Lessen stress in your life, because stress can be a smoking trigger for some people. To lessen stress, try:  Exercising regularly.  Deep-breathing exercises.  Yoga.  Meditating.  Performing a body scan. This involves closing your eyes, scanning your body from head to toe, and noticing which parts of your body are particularly tense. Purposefully relax the muscles in those areas.  Download or purchase mobile phone or tablet apps (applications) that can help you stick to your quit plan by providing reminders, tips, and encouragement. There are many free apps, such as QuitGuide from the CDC (Centers for Disease Control and Prevention). You can find other support for quitting smoking (smoking cessation) through smokefree.gov and other websites. HOW WILL I FEEL WHEN I QUIT SMOKING? Within the first 24 hours of quitting smoking, you may start to feel some withdrawal symptoms. These symptoms are usually most noticeable 2-3 days after quitting, but they usually do not last beyond 2-3 weeks. Changes or symptoms that you might experience include:  Mood swings.  Restlessness, anxiety, or irritation.  Difficulty concentrating.  Dizziness.  Strong cravings for sugary foods in addition to nicotine.  Mild weight  gain.  Constipation.  Nausea.  Coughing or a sore throat.  Changes in how your medicines work in your body.  A depressed mood.  Difficulty sleeping (insomnia). After the first 2-3 weeks of quitting, you may start to notice more positive results, such as:  Improved sense of smell and taste.  Decreased coughing and sore throat.  Slower heart rate.  Lower blood pressure.  Clearer skin.  The ability to breathe more easily.  Fewer sick days. Quitting smoking is very challenging for most people. Do not get discouraged if you are not successful the first time. Some people need to make many attempts to quit before they achieve long-term success. Do your best to stick to your quit plan, and talk with your health care provider if you have any questions or concerns.   This information is not intended to replace advice given to you by your health care provider. Make sure you discuss any questions   you have with your health care provider.   Document Released: 09/09/2001 Document Revised: 01/30/2015 Document Reviewed: 01/30/2015 Elsevier Interactive Patient Education 2016 Elsevier Inc.  

## 2016-04-14 NOTE — Progress Notes (Signed)
Chief Complaint: Follow up Extracranial Carotid Artery Stenosis   History of Present Illness  Morgan Davenport is a 55 y.o. female patient of Dr. Trula Slade. The patient is back today for followup of her extracranial carotid artery occlusive disease.  She was initially seen in 2011. At that time, she had a head CT scan for headaches. One of the findings was concerning for subarachnoid hemorrhage. She underwent an arteriogram that did not show any evidence of aneurysm. There was however 35-40% stenosis of the right internal carotid artery and 40% stenosis of the left subclavian artery. It was later felt that the head CT scan was more consistent with an area of hypodensity and possibly calcification rather than subarachnoid hemorrhage. She experienced headaches and dizziness with this which she still has intermittently.   She denies history of hemiparesis, denies sudden onset confusion with slurred speech or aphasia, denies sudden loss of vision.  Pt denies claudication symptoms with walking, denies non healing wounds.  Patient has not had previous carotid artery intervention.  Pt Diabetic: Yes, 6.5 she states was her last A1C Pt smoker: smoker (6-7 cigarettes/day, started at age 44 yrs, quit, then resumed). Pt states she has an appointment with her PCP in August 2017.  Pt meds include: Statin : yes ASA: No Other anticoagulants/antiplatelets: no    Past Medical History  Diagnosis Date  . Hypertension   . Diabetes mellitus   . Carotid artery occlusion   . Depression   . Headache     Social History Social History  Substance Use Topics  . Smoking status: Light Tobacco Smoker -- 0.50 packs/day for 10 years    Types: Cigarettes  . Smokeless tobacco: Never Used  . Alcohol Use: No    Family History Family History  Problem Relation Age of Onset  . Diabetes Mother   . Hypertension Mother   . Hyperlipidemia Mother   . Heart disease Mother     NOT before age 36  . Heart attack  Mother   . Stomach cancer Father     Surgical History Past Surgical History  Procedure Laterality Date  . Abdominal hysterectomy  Dec. 24, 2008    Allergies  Allergen Reactions  . Tramadol Other (See Comments)    Headache    Current Outpatient Prescriptions  Medication Sig Dispense Refill  . clonazePAM (KLONOPIN) 0.5 MG tablet Take 0.5 mg by mouth daily as needed for anxiety.     Marland Kitchen glipiZIDE (GLUCOTROL) 5 MG tablet Take 5 mg by mouth 2 (two) times daily. Reported on 04/14/2016    . hydrochlorothiazide (HYDRODIURIL) 25 MG tablet Take 25 mg by mouth daily.    Marland Kitchen HYDROcodone-acetaminophen (NORCO/VICODIN) 5-325 MG tablet Take 1 tablet by mouth every 6 (six) hours as needed for moderate pain.     Marland Kitchen losartan (COZAAR) 50 MG tablet Take 50 mg by mouth daily.    . meclizine (ANTIVERT) 50 MG tablet Take 1 tablet (50 mg total) by mouth 3 (three) times daily as needed. 30 tablet 0  . Multiple Vitamin (MULITIVITAMIN WITH MINERALS) TABS Take 1 tablet by mouth daily.    . sertraline (ZOLOFT) 100 MG tablet Take 100 mg by mouth daily.     . simvastatin (ZOCOR) 10 MG tablet Take 10 mg by mouth daily.     No current facility-administered medications for this visit.    Review of Systems : See HPI for pertinent positives and negatives.  Physical Examination  Filed Vitals:   04/14/16 0925  BP:  131/71  Pulse: 63  Temp: 97.3 F (36.3 C)  TempSrc: Oral  Resp: 18  Height: 5\' 4"  (1.626 m)  Weight: 148 lb 9.6 oz (67.405 kg)  SpO2: 99%   Body mass index is 25.49 kg/(m^2).  General: WDWN female in NAD GAIT: normal Eyes: PERRLA Pulmonary: Respirations are non-labored, CTAB Cardiac: regular rhythm, no detected murmur.  VASCULAR EXAM Carotid Bruits Left Right   Negative Negative   aorta is faintly palpable Radial pulses are 2+ palpable and equal.       LE Pulses LEFT RIGHT   POPLITEAL not palpable  not palpable   POSTERIOR TIBIAL 1+ palpable  1+ palpable    DORSALIS PEDIS  ANTERIOR TIBIAL 2+ palpable  2+ palpable     Gastrointestinal: soft, nontender, BS WNL, no r/g,no palpable masses.  Musculoskeletal: No muscle atrophy/wasting. M/S 5/5 throughout, Extremities without ischemic changes.  Neurologic: A&O X 3; Appropriate Affect,  Speech is normal CN 2-12 intact, Pain and light touch intact in extremities, Motor exam as listed above.               Non-Invasive Vascular Imaging CAROTID DUPLEX 04/14/2016   CEREBROVASCULAR DUPLEX EVALUATION    INDICATION: Carotid artery disease    PREVIOUS INTERVENTION(S): None    DUPLEX EXAM: Carotid duplex    RIGHT  LEFT  Peak Systolic Velocities (cm/s) End Diastolic Velocities (cm/s) Plaque LOCATION Peak Systolic Velocities (cm/s) End Diastolic Velocities (cm/s) Plaque  97 20  CCA PROXIMAL 140 37   90 26  CCA MID 92 26   65 24  CCA DISTAL 68 26   82 13  ECA 61 9   180 92 HT ICA PROXIMAL 65 37 HT  123 49 HT ICA MID 75 39   105 43 HT ICA DISTAL 126 39     2.0 ICA / CCA Ratio (PSV) 0.70  Antegrade Vertebral Flow Antegrade  - Brachial Systolic Pressure (mmHg) -  Triphasic Brachial Artery Waveforms Triphasic    Plaque Morphology:  HM = Homogeneous, HT = Heterogeneous, CP = Calcific Plaque, SP = Smooth Plaque, IP = Irregular Plaque     ADDITIONAL FINDINGS: Multiphasic subclavian arteries    IMPRESSION: 1. 40 - 59% right internal carotid artery stenosis upper end of range 2. Less than 40% left internal carotid artery stenosis    Compared to the previous exam:  No significant change  since exam of 04/09/2015      Assessment: Morgan Davenport is a 55 y.o. female who has no history of stroke or TIA. Today's carotid Duplex suggests 40-59% right internal carotid artery stenosis and minimal (<40%) left ICA stenosis. No  significant change  since exam of 04/09/2015.   Fortunately her DM remains in good control and she was congratulated re this. Unfortunately she resumed smoking but expressed a desire to quit; see Plan. She is taking a statin.   Plan:  The patient was counseled re smoking cessation and given several free resources re smoking cessation.   Follow-up in 1 year with Carotid Duplex scan.   I discussed in depth with the patient the nature of atherosclerosis, and emphasized the importance of maximal medical management including strict control of blood pressure, blood glucose, and lipid levels, obtaining regular exercise, and cessation of smoking.  The patient is aware that without maximal medical management the underlying atherosclerotic disease process will progress, limiting the benefit of any interventions. The patient was given information about stroke prevention and what symptoms should prompt the patient to  seek immediate medical care. Thank you for allowing [place] to participate in this patient's care.  Clemon Chambers, RN, MSN, FNP-C [facility] Office: [facility] Physician: Trula Slade  04/14/2016 10:24 AM

## 2016-05-20 ENCOUNTER — Other Ambulatory Visit: Payer: Self-pay | Admitting: Family Medicine

## 2016-05-20 DIAGNOSIS — Z1231 Encounter for screening mammogram for malignant neoplasm of breast: Secondary | ICD-10-CM

## 2016-06-23 ENCOUNTER — Ambulatory Visit
Admission: RE | Admit: 2016-06-23 | Discharge: 2016-06-23 | Disposition: A | Discharge status: Home / Self Care | Payer: BLUE CROSS/BLUE SHIELD | Source: Ambulatory Visit | Attending: Family Medicine | Admitting: Family Medicine

## 2016-06-23 DIAGNOSIS — Z1231 Encounter for screening mammogram for malignant neoplasm of breast: Secondary | ICD-10-CM

## 2016-08-15 NOTE — Addendum Note (Signed)
Addended by: Lianne Cure A on: 08/15/2016 12:25 PM   Modules accepted: Orders

## 2016-09-19 ENCOUNTER — Encounter (HOSPITAL_COMMUNITY): Payer: Self-pay | Admitting: Emergency Medicine

## 2016-09-19 ENCOUNTER — Emergency Department (HOSPITAL_COMMUNITY)
Admission: EM | Admit: 2016-09-19 | Discharge: 2016-09-19 | Disposition: A | Discharge status: Home / Self Care | Payer: BLUE CROSS/BLUE SHIELD | Attending: Emergency Medicine | Admitting: Emergency Medicine

## 2016-09-19 DIAGNOSIS — Z7984 Long term (current) use of oral hypoglycemic drugs: Secondary | ICD-10-CM | POA: Insufficient documentation

## 2016-09-19 DIAGNOSIS — M5481 Occipital neuralgia: Secondary | ICD-10-CM | POA: Insufficient documentation

## 2016-09-19 DIAGNOSIS — G4489 Other headache syndrome: Secondary | ICD-10-CM | POA: Diagnosis not present

## 2016-09-19 DIAGNOSIS — R51 Headache: Secondary | ICD-10-CM | POA: Diagnosis present

## 2016-09-19 DIAGNOSIS — E119 Type 2 diabetes mellitus without complications: Secondary | ICD-10-CM | POA: Insufficient documentation

## 2016-09-19 DIAGNOSIS — Z79899 Other long term (current) drug therapy: Secondary | ICD-10-CM | POA: Insufficient documentation

## 2016-09-19 DIAGNOSIS — I10 Essential (primary) hypertension: Secondary | ICD-10-CM | POA: Diagnosis not present

## 2016-09-19 DIAGNOSIS — F17210 Nicotine dependence, cigarettes, uncomplicated: Secondary | ICD-10-CM | POA: Insufficient documentation

## 2016-09-19 MED ORDER — NAPROXEN 500 MG PO TABS
500.0000 mg | ORAL_TABLET | Freq: Once | ORAL | Status: AC
  Administered 2016-09-19: 500 mg via ORAL
  Filled 2016-09-19: qty 1

## 2016-09-19 MED ORDER — BUPIVACAINE HCL (PF) 0.5 % IJ SOLN
10.0000 mL | Freq: Once | INTRAMUSCULAR | Status: AC
  Administered 2016-09-19: 10 mL
  Filled 2016-09-19: qty 30

## 2016-09-19 NOTE — ED Triage Notes (Addendum)
Pt reports L head pain around ear that comes and goes intermittently since this am. Also reports the L side of her face feels strange/heavy for the past 30 minutes. No facial droop or unilateral extremity weakness noted.

## 2016-09-19 NOTE — ED Provider Notes (Signed)
[facility] DEPT Provider Note   CSN: [id number] Arrival date & time: 09/19/16  1700     History   Chief Complaint Chief Complaint  Patient presents with  . Headache    HPI Morgan Davenport is a 55 y.o. female.  The history is provided by the patient.  Headache   This is a new problem. Episode onset: 6 hours ago. Episode frequency: every 30 minutes. Progression since onset: coming on for a few seconds then resolving. The headache is associated with nothing. The pain is located in the left unilateral region. The quality of the pain is described as sharp (shooting). The pain is moderate. The pain does not radiate. Pertinent negatives include no syncope and no vomiting. She has tried nothing for the symptoms.    Past Medical History:  Diagnosis Date  . Carotid artery occlusion   . Depression   . Diabetes mellitus   . Headache   . Hypertension     Patient Active Problem List   Diagnosis Date Noted  . Chronic migraine 10/02/2015  . Occlusion and stenosis of carotid artery without mention of cerebral infarction 03/28/2013    Past Surgical History:  Procedure Laterality Date  . ABDOMINAL HYSTERECTOMY  Dec. 24, 2008    OB History    No data available       Home Medications    Prior to Admission medications   Medication Sig Start Date End Date Taking? Authorizing Provider  clonazePAM (KLONOPIN) 0.5 MG tablet Take 0.5 mg by mouth daily as needed for anxiety.    Yes Historical Provider, MD  glipiZIDE (GLUCOTROL) 5 MG tablet Take 5 mg by mouth 2 (two) times daily.    Yes Historical Provider, MD  hydrochlorothiazide (HYDRODIURIL) 25 MG tablet Take 25 mg by mouth daily.   Yes Historical Provider, MD  HYDROcodone-acetaminophen (NORCO/VICODIN) 5-325 MG tablet Take 1 tablet by mouth every 6 (six) hours as needed for moderate pain.    Yes Historical Provider, MD  losartan (COZAAR) 50 MG tablet Take 50 mg by mouth daily.   Yes Historical Provider, MD  Multiple Vitamin  (MULITIVITAMIN WITH MINERALS) TABS Take 1 tablet by mouth daily.   Yes Historical Provider, MD  sertraline (ZOLOFT) 50 MG tablet Take 50 mg by mouth daily.   Yes Historical Provider, MD  simvastatin (ZOCOR) 10 MG tablet Take 10 mg by mouth at bedtime.    Yes Historical Provider, MD    Family History Family History  Problem Relation Age of Onset  . Diabetes Mother   . Hypertension Mother   . Hyperlipidemia Mother   . Heart disease Mother     NOT before age 70  . Heart attack Mother   . Stomach cancer Father     Social History Social History  Substance Use Topics  . Smoking status: Light Tobacco Smoker    Packs/day: 0.50    Years: 10.00    Types: Cigarettes  . Smokeless tobacco: Never Used  . Alcohol use No     Allergies   Tramadol   Review of Systems Review of Systems  Cardiovascular: Negative for syncope.  Gastrointestinal: Negative for vomiting.  Neurological: Positive for headaches.  All other systems reviewed and are negative.    Physical Exam Updated Vital Signs BP 111/68 (BP Location: Right Arm)   Pulse 65   Temp 98.2 F (36.8 C) (Oral)   Resp 20   SpO2 100%   Physical Exam  Constitutional: She is oriented to person, place, and time.  She appears well-developed and well-nourished. No distress.  HENT:  Head: Normocephalic.  Nose: Nose normal.  Eyes: Conjunctivae are normal.  Neck: Neck supple. No tracheal deviation present.  Cardiovascular: Normal rate and regular rhythm.   Pulmonary/Chest: Effort normal. No respiratory distress.  Abdominal: Soft. She exhibits no distension.  Neurological: She is alert and oriented to person, place, and time. No cranial nerve deficit. Coordination normal.  Normal finger to nose testing and rapid alternating movement   Skin: Skin is warm and dry.  Psychiatric: She has a normal mood and affect.     ED Treatments / Results  Labs (all labs ordered are listed, but only abnormal results are displayed) Labs Reviewed  - No data to display  EKG  EKG Interpretation None       Radiology No results found.  Procedures Procedures (including critical care time) Procedure Note: Trigger Point Injection for Myofascial pain  Performed by Dr. Laneta Simmers Indication: muscle/myofascial pain Muscle body and tendon sheath of the leftt cervical paraspinal muscle(s) were injected with 0.5% bupivacaine under sterile technique for release of muscle spasm/pain. Patient tolerated well with immediate improvement of symptoms and no immediate complications following procedure.  CPT Code:   1 or 2 muscle bodies: 20552   Medications Ordered in ED Medications  bupivacaine (MARCAINE) 0.5 % injection 10 mL (10 mLs Infiltration Given 09/19/16 2012)  naproxen (NAPROSYN) tablet 500 mg (500 mg Oral Given 09/19/16 2011)     Initial Impression / Assessment and Plan / ED Course  I have reviewed the triage vital signs and the nursing notes.  Pertinent labs & imaging results that were available during my care of the patient were reviewed by me and considered in my medical decision making (see chart for details).  Clinical Course     55 y.o. female presents with left neck pain progressing to left sided sporadic, sharp headache that has occurred over the course of the day. Typical findings of neuralgia, applied local anaesthesia to neck with improvement of symptoms. Recommended NSAIDs for prevention of recurrence. Plan to follow up with PCP as needed and return precautions discussed for worsening or new concerning symptoms.   Final Clinical Impressions(s) / ED Diagnoses   Final diagnoses:  Other headache syndrome  Occipital neuralgia of left side    New Prescriptions Discharge Medication List as of 09/19/2016  9:22 PM       Leo Grosser, MD 09/20/16 [phone]

## 2016-12-28 ENCOUNTER — Encounter (HOSPITAL_COMMUNITY): Payer: Self-pay | Admitting: *Deleted

## 2016-12-28 ENCOUNTER — Ambulatory Visit (HOSPITAL_COMMUNITY)
Admission: EM | Admit: 2016-12-28 | Discharge: 2016-12-28 | Disposition: A | Discharge status: Home / Self Care | Payer: BLUE CROSS/BLUE SHIELD | Attending: Family Medicine | Admitting: Family Medicine

## 2016-12-28 DIAGNOSIS — W57XXXA Bitten or stung by nonvenomous insect and other nonvenomous arthropods, initial encounter: Secondary | ICD-10-CM

## 2016-12-28 DIAGNOSIS — L299 Pruritus, unspecified: Secondary | ICD-10-CM

## 2016-12-28 DIAGNOSIS — L309 Dermatitis, unspecified: Secondary | ICD-10-CM | POA: Diagnosis not present

## 2016-12-28 MED ORDER — PREDNISONE 50 MG PO TABS: ORAL_TABLET | ORAL | 0 refills | Status: DC

## 2016-12-28 MED ORDER — DICLOFENAC SODIUM 75 MG PO TBEC: 75.0000 mg | DELAYED_RELEASE_TABLET | Freq: Two times a day (BID) | ORAL | 0 refills | Status: DC

## 2016-12-28 MED ORDER — DOXYCYCLINE HYCLATE 100 MG PO CAPS: 100.0000 mg | ORAL_CAPSULE | Freq: Two times a day (BID) | ORAL | 0 refills | Status: AC

## 2016-12-28 NOTE — ED Triage Notes (Signed)
Pt  Reports    Symptoms    Of    Rash   And  Itching     Sensation          She  Reports  She   Had  Seen     What  She  Describes     As  A  Tick  That  She  Noticed  While  She  Was  In  Bed  With  Her  Dog   She  Reports  He  Neck  Hurts  As   Well  With  Movement

## 2016-12-28 NOTE — Discharge Instructions (Signed)
To cover for possible tick bite, I have prescribed doxycycline, take 1 tablet by mouth for 14 days. For your itching, I prescribed prednisone, take 1 tablet by mouth daily with food, I also recommend over-the-counter Benadryl every 6 hours as well. For your headache, I prescribed a medicine called diclofenac, take one tablet twice a day. Should your symptoms persist past one week, follow-up with primary care provider, or return to clinic.

## 2016-12-28 NOTE — ED Provider Notes (Signed)
CSN: [id number]     Arrival date & time 12/28/16  1722 History   First MD Initiated Contact with Patient 12/28/16 1758     Chief Complaint  Patient presents with  . Insect Bite   (Consider location/radiation/quality/duration/timing/severity/associated sxs/prior Treatment) 56 year old female presents to clinic for evaluation of insect bite at the back of her neck. She is concerned about the possibility of a "tick bite" a she "sleeps with her dog every night, and found a tick in bed" she is unsure what kind of take it was, other than saying it was large and round. She is complaining of itching around the wound, discomfort turning her head side to side, and a headache. She denies any nausea, vomiting, diarrhea, chest pain or palpitations, swelling in her hands feet or ankles, back pain, arthralgias, or other form of joint pain.   The history is provided by the patient.    Past Medical History:  Diagnosis Date  . Carotid artery occlusion   . Depression   . Diabetes mellitus   . Headache   . Hypertension    Past Surgical History:  Procedure Laterality Date  . ABDOMINAL HYSTERECTOMY  Dec. 24, 2008   Family History  Problem Relation Age of Onset  . Diabetes Mother   . Hypertension Mother   . Hyperlipidemia Mother   . Heart disease Mother     NOT before age 6  . Heart attack Mother   . Stomach cancer Father    Social History  Substance Use Topics  . Smoking status: Light Tobacco Smoker    Packs/day: 0.50    Years: 10.00    Types: Cigarettes  . Smokeless tobacco: Never Used  . Alcohol use No   OB History    No data available     Review of Systems  Constitutional: Negative for chills, fatigue and fever.  HENT: Negative for rhinorrhea, sinus pain, sinus pressure and sore throat.   Respiratory: Negative for chest tightness and shortness of breath.   Cardiovascular: Negative for chest pain and palpitations.  Gastrointestinal: Negative for abdominal pain, diarrhea, nausea and  vomiting.  Genitourinary: Negative.   Musculoskeletal: Positive for neck stiffness. Negative for back pain, joint swelling, myalgias and neck pain.  Skin: Positive for rash. Negative for pallor.  Neurological: Positive for headaches. Negative for dizziness, weakness and light-headedness.  All other systems reviewed and are negative.   Allergies  Tramadol  Home Medications   Prior to Admission medications   Medication Sig Start Date End Date Taking? Authorizing Provider  clonazePAM (KLONOPIN) 0.5 MG tablet Take 0.5 mg by mouth daily as needed for anxiety.     Historical Provider, MD  diclofenac (VOLTAREN) 75 MG EC tablet Take 1 tablet (75 mg total) by mouth 2 (two) times daily. 12/28/16   Barnet Glasgow, NP  doxycycline (VIBRAMYCIN) 100 MG capsule Take 1 capsule (100 mg total) by mouth 2 (two) times daily. 12/28/16 01/11/17  Barnet Glasgow, NP  glipiZIDE (GLUCOTROL) 5 MG tablet Take 5 mg by mouth 2 (two) times daily.     Historical Provider, MD  hydrochlorothiazide (HYDRODIURIL) 25 MG tablet Take 25 mg by mouth daily.    Historical Provider, MD  HYDROcodone-acetaminophen (NORCO/VICODIN) 5-325 MG tablet Take 1 tablet by mouth every 6 (six) hours as needed for moderate pain.     Historical Provider, MD  losartan (COZAAR) 50 MG tablet Take 50 mg by mouth daily.    Historical Provider, MD  Multiple Vitamin (MULITIVITAMIN WITH MINERALS) TABS Take 1  tablet by mouth daily.    Historical Provider, MD  predniSONE (DELTASONE) 50 MG tablet Take 1 tablet by mouth daily with food 12/28/16   Barnet Glasgow, NP  sertraline (ZOLOFT) 50 MG tablet Take 50 mg by mouth daily.    Historical Provider, MD  simvastatin (ZOCOR) 10 MG tablet Take 10 mg by mouth at bedtime.     Historical Provider, MD   Meds Ordered and Administered this Visit  Medications - No data to display  BP (!) 170/95   Pulse 100   Temp 98.6 F (37 C) (Oral)   Resp 18   SpO2 100%  No data found.   Physical Exam  Constitutional: She  is oriented to person, place, and time. She appears well-developed and well-nourished. No distress.  HENT:  Head: Normocephalic and atraumatic.  Right Ear: External ear normal.  Left Ear: External ear normal.  Eyes: EOM are normal. Pupils are equal, round, and reactive to light.  Neck: Normal range of motion. Neck supple.  Cardiovascular: Normal rate and regular rhythm.   Pulmonary/Chest: Effort normal and breath sounds normal.  Lymphadenopathy:    She has no cervical adenopathy.  Neurological: She is alert and oriented to person, place, and time.  Skin: Skin is warm and dry. Capillary refill takes less than 2 seconds. No rash noted. She is not diaphoretic. No erythema.     Psychiatric: She has a normal mood and affect. Her behavior is normal.  Nursing note and vitals reviewed.   Urgent Care Course     Procedures (including critical care time)  Labs Review Labs Reviewed - No data to display  Imaging Review No results found.     MDM   1. Insect bite, initial encounter     Bites on the back of the neck do not appear consistent with tick bite, however due to her having found a tick in her bed treating with doxycycline empirically. Given prednisone for her itching, diclofenac for headache, and encouraged to take over-the-counter Benadryl as well. Follow-up with primary care in 1 week if symptoms persist.    Barnet Glasgow, NP 12/28/16 1812

## 2017-04-08 ENCOUNTER — Encounter: Payer: Self-pay | Admitting: Family

## 2017-04-20 ENCOUNTER — Ambulatory Visit (INDEPENDENT_AMBULATORY_CARE_PROVIDER_SITE_OTHER): Payer: BLUE CROSS/BLUE SHIELD | Admitting: Family

## 2017-04-20 ENCOUNTER — Encounter: Payer: Self-pay | Admitting: Family

## 2017-04-20 ENCOUNTER — Ambulatory Visit (HOSPITAL_COMMUNITY)
Admission: RE | Admit: 2017-04-20 | Discharge: 2017-04-20 | Disposition: A | Discharge status: Home / Self Care | Payer: BLUE CROSS/BLUE SHIELD | Source: Ambulatory Visit | Attending: Surgery | Admitting: Surgery

## 2017-04-20 VITALS — BP 120/71 | HR 60 | Temp 97.3°F | Resp 20 | Ht 64.0 in | Wt 141.0 lb

## 2017-04-20 DIAGNOSIS — I6523 Occlusion and stenosis of bilateral carotid arteries: Secondary | ICD-10-CM | POA: Diagnosis not present

## 2017-04-20 DIAGNOSIS — F17200 Nicotine dependence, unspecified, uncomplicated: Secondary | ICD-10-CM

## 2017-04-20 DIAGNOSIS — I6521 Occlusion and stenosis of right carotid artery: Secondary | ICD-10-CM

## 2017-04-20 LAB — VAS US CAROTID
LEFT ECA DIAS: -17 cm/s
LEFT VERTEBRAL DIAS: -19 cm/s
Left CCA dist dias: 20 cm/s
Left CCA dist sys: 75 cm/s
Left CCA prox dias: 33 cm/s
Left CCA prox sys: 162 cm/s
Left ICA prox dias: -16 cm/s
Left ICA prox sys: -52 cm/s
RIGHT CCA MID DIAS: 22 cm/s
RIGHT ECA DIAS: -9 cm/s
RIGHT VERTEBRAL DIAS: 13 cm/s
Right CCA prox dias: 19 cm/s
Right CCA prox sys: 79 cm/s
Right cca dist sys: -81 cm/s

## 2017-04-20 NOTE — Patient Instructions (Signed)
Stroke Prevention Some medical conditions and behaviors are associated with an increased chance of having a stroke. You may prevent a stroke by making healthy choices and managing medical conditions. How can I reduce my risk of having a stroke?  Stay physically active. Get at least 30 minutes of activity on most or all days.  Do not smoke. It may also be helpful to avoid exposure to secondhand smoke.  Limit alcohol use. Moderate alcohol use is considered to be: ? No more than 2 drinks per day for men. ? No more than 1 drink per day for nonpregnant women.  Eat healthy foods. This involves: ? Eating 5 or more servings of fruits and vegetables a day. ? Making dietary changes that address high blood pressure (hypertension), high cholesterol, diabetes, or obesity.  Manage your cholesterol levels. ? Making food choices that are high in fiber and low in saturated fat, trans fat, and cholesterol may control cholesterol levels. ? Take any prescribed medicines to control cholesterol as directed by your health care provider.  Manage your diabetes. ? Controlling your carbohydrate and sugar intake is recommended to manage diabetes. ? Take any prescribed medicines to control diabetes as directed by your health care provider.  Control your hypertension. ? Making food choices that are low in salt (sodium), saturated fat, trans fat, and cholesterol is recommended to manage hypertension. ? Ask your health care provider if you need treatment to lower your blood pressure. Take any prescribed medicines to control hypertension as directed by your health care provider. ? If you are 18-39 years of age, have your blood pressure checked every 3-5 years. If you are 40 years of age or older, have your blood pressure checked every year.  Maintain a healthy weight. ? Reducing calorie intake and making food choices that are low in sodium, saturated fat, trans fat, and cholesterol are recommended to manage  weight.  Stop drug abuse.  Avoid taking birth control pills. ? Talk to your health care provider about the risks of taking birth control pills if you are over 35 years old, smoke, get migraines, or have ever had a blood clot.  Get evaluated for sleep disorders (sleep apnea). ? Talk to your health care provider about getting a sleep evaluation if you snore a lot or have excessive sleepiness.  Take medicines only as directed by your health care provider. ? For some people, aspirin or blood thinners (anticoagulants) are helpful in reducing the risk of forming abnormal blood clots that can lead to stroke. If you have the irregular heart rhythm of atrial fibrillation, you should be on a blood thinner unless there is a good reason you cannot take them. ? Understand all your medicine instructions.  Make sure that other conditions (such as anemia or atherosclerosis) are addressed. Get help right away if:  You have sudden weakness or numbness of the face, arm, or leg, especially on one side of the body.  Your face or eyelid droops to one side.  You have sudden confusion.  You have trouble speaking (aphasia) or understanding.  You have sudden trouble seeing in one or both eyes.  You have sudden trouble walking.  You have dizziness.  You have a loss of balance or coordination.  You have a sudden, severe headache with no known cause.  You have new chest pain or an irregular heartbeat. Any of these symptoms may represent a serious problem that is an emergency. Do not wait to see if the symptoms will go away.   Get medical help at once. Call your local emergency services (911 in [place] Do not drive yourself to the hospital. This information is not intended to replace advice given to you by your health care provider. Make sure you discuss any questions you have with your health care provider. Document Released: 10/23/2004 Document Revised: 02/21/2016 Document Reviewed: 03/18/2013 Elsevier  Interactive Patient Education  2017 Elsevier Inc.     Preventing Cerebrovascular Disease Arteries are blood vessels that carry blood that contains oxygen from the heart to all parts of the body. Cerebrovascular disease affects arteries that supply the brain. Any condition that blocks or disrupts blood flow to the brain can cause cerebrovascular disease. Brain cells that lose blood supply start to die within minutes (stroke). Stroke is the main danger of cerebrovascular disease. Atherosclerosis and high blood pressure are common causes of cerebrovascular disease. Atherosclerosis is narrowing and hardening of an artery that results when fat, cholesterol, calcium, or other substances (plaque) build up inside an artery. Plaque reduces blood flow through the artery. High blood pressure increases the risk of bleeding inside the brain. Making diet and lifestyle changes to prevent atherosclerosis and high blood pressure lowers your risk of cerebrovascular disease. What nutrition changes can be made?  Eat more fruits, vegetables, and whole grains.  Reduce how much saturated fat you eat. To do this, eat less red meat and fewer full-fat dairy products.  Eat healthy proteins instead of red meat. Healthy proteins include: ? Fish. Eat fish that contains heart-healthy omega-3 fatty acids, twice a week. Examples include salmon, albacore tuna, mackerel, and herring. ? Chicken. ? Nuts. ? Low-fat or nonfat yogurt.  Avoid processed meats, like bacon and lunchmeat.  Avoid foods that contain: ? A lot of sugar, such as sweets and drinks with added sugar. ? A lot of salt (sodium). Avoid adding extra salt to your food, as told by your health care provider. ? Trans fats, such as margarine and baked goods. Trans fats may be listed as "partially hydrogenated oils" on food labels.  Check food labels to see how much sodium, sugar, and trans fats are in foods.  Use vegetable oils that contain low amounts of  saturated fat, such as olive oil or canola oil. What lifestyle changes can be made?  Drink alcohol in moderation. This means no more than 1 drink a day for nonpregnant women and 2 drinks a day for men. One drink equals 12 oz of beer, 5 oz of wine, or 1 oz of hard liquor.  If you are overweight, ask your health care provider to recommend a weight-loss plan for you. Losing 5-10 lb (2.2-4.5 kg) can reduce your risk of diabetes, atherosclerosis, and high blood pressure.  Exercise for 30?60 minutes on most days, or as much as told by your health care provider. ? Do moderate-intensity exercise, such as brisk walking, bicycling, and water aerobics. Ask your health care provider which activities are safe for you.  Do not use any products that contain nicotine or tobacco, such as cigarettes and e-cigarettes. If you need help quitting, ask your health care provider. Why are these changes important? Making these changes lowers your risk of many diseases that can cause cerebrovascular disease and stroke. Stroke is a leading cause of death and disability. Making these changes also improves your overall health and quality of life. What can I do to lower my risk? The following factors make you more likely to develop cerebrovascular disease:  Being overweight.  Smoking.  Being physically inactive.    Eating a high-fat diet.  Having certain health conditions, such as: ? Diabetes. ? High blood pressure. ? Heart disease. ? Atherosclerosis. ? High cholesterol. ? Sickle cell disease.  Talk with your health care provider about your risk for cerebrovascular disease. Work with your health care provider to control diseases that you have that may contribute to cerebrovascular disease. Your health care provider may prescribe medicines to help prevent major causes of cerebrovascular disease. Where to find more information: Learn more about preventing cerebrovascular disease from:  [place], Lung, and  [place]: MoAnalyst.de  Centers for Disease Control and Prevention: http://www.curry-wood.biz/  Summary  Cerebrovascular disease can lead to a stroke.  Atherosclerosis and high blood pressure are major causes of cerebrovascular disease.  Making diet and lifestyle changes can reduce your risk of cerebrovascular disease.  Work with your health care provider to get your risk factors under control to reduce your risk of cerebrovascular disease. This information is not intended to replace advice given to you by your health care provider. Make sure you discuss any questions you have with your health care provider. Document Released: 09/30/2015 Document Revised: 04/04/2016 Document Reviewed: 09/30/2015 Elsevier Interactive Patient Education  2018 [employer].     Steps to Quit Smoking Smoking tobacco can be bad for your health. It can also affect almost every organ in your body. Smoking puts you and people around you at risk for many serious long-lasting (chronic) diseases. Quitting smoking is hard, but it is one of the best things that you can do for your health. It is never too late to quit. What are the benefits of quitting smoking? When you quit smoking, you lower your risk for getting serious diseases and conditions. They can include:  Lung cancer or lung disease.  Heart disease.  Stroke.  Heart attack.  Not being able to have children (infertility).  Weak bones (osteoporosis) and broken bones (fractures).  If you have coughing, wheezing, and shortness of breath, those symptoms may get better when you quit. You may also get sick less often. If you are pregnant, quitting smoking can help to lower your chances of having a baby of low birth weight. What can I do to help me quit smoking? Talk with your doctor about what can help you quit smoking. Some things you can do (strategies) include:  Quitting smoking totally, instead of slowly  cutting back how much you smoke over a period of time.  Going to in-person counseling. You are more likely to quit if you go to many counseling sessions.  Using resources and support systems, such as: ? Database administrator with a Social worker. ? Phone quitlines. ? Careers information officer. ? Support groups or group counseling. ? Text messaging programs. ? Mobile phone apps or applications.  Taking medicines. Some of these medicines may have nicotine in them. If you are pregnant or breastfeeding, do not take any medicines to quit smoking unless your doctor says it is okay. Talk with your doctor about counseling or other things that can help you.  Talk with your doctor about using more than one strategy at the same time, such as taking medicines while you are also going to in-person counseling. This can help make quitting easier. What things can I do to make it easier to quit? Quitting smoking might feel very hard at first, but there is a lot that you can do to make it easier. Take these steps:  Talk to your family and friends. Ask them to support and encourage  you.  Call phone quitlines, reach out to support groups, or work with a Social worker.  Ask people who smoke to not smoke around you.  Avoid places that make you want (trigger) to smoke, such as: ? Bars. ? Parties. ? Smoke-break areas at work.  Spend time with people who do not smoke.  Lower the stress in your life. Stress can make you want to smoke. Try these things to help your stress: ? Getting regular exercise. ? Deep-breathing exercises. ? Yoga. ? Meditating. ? Doing a body scan. To do this, close your eyes, focus on one area of your body at a time from head to toe, and notice which parts of your body are tense. Try to relax the muscles in those areas.  Download or buy apps on your mobile phone or tablet that can help you stick to your quit plan. There are many free apps, such as QuitGuide from the [employer] Office manager for Disease  Control and Prevention). You can find more support from smokefree.gov and other websites.  This information is not intended to replace advice given to you by your health care provider. Make sure you discuss any questions you have with your health care provider. Document Released: 07/12/2009 Document Revised: 05/13/2016 Document Reviewed: 01/30/2015 Elsevier Interactive Patient Education  2018 [employer].

## 2017-04-20 NOTE — Progress Notes (Signed)
Chief Complaint: Follow up Extracranial Carotid Artery Stenosis   History of Present Illness  Morgan Davenport is a 56 y.o. female patient of Dr. Trula Slade. The patient is back today for followup of her extracranial carotid artery occlusive disease.  She was initially seen in 2011. At that time, she had a head CT scan for headaches. One of the findings was concerning for subarachnoid hemorrhage. She underwent an arteriogram that did not show any evidence of aneurysm. There was however 35-40% stenosis of the right internal carotid artery and 40% stenosis of the left subclavian artery. It was later felt that the head CT scan was more consistent with an area of hypodensity and possibly calcification rather than subarachnoid hemorrhage. She experienced headaches and dizziness with this which she still has intermittently.   She denies history of hemiparesis, denies sudden onset confusion with slurred speech or aphasia, denies sudden loss of vision.  Pt denies claudication symptoms with walking, denies non healing wounds.  Patient has not had previous carotid artery intervention.  Pt Diabetic: Yes, 6.7? she states was her last A1C Pt smoker: smoker (6-7 cigarettes/day, started at age 54 yrs, quit, then resumed). Pt states she has an appointment with her PCP in August 2017.  Pt meds include: Statin : yes ASA: No Other anticoagulants/antiplatelets: no    Past Medical History:  Diagnosis Date  . Carotid artery occlusion   . Depression   . Diabetes mellitus   . Headache   . Hypertension     Social History Social History  Substance Use Topics  . Smoking status: Light Tobacco Smoker    Packs/day: 0.25    Years: 10.00    Types: Cigarettes  . Smokeless tobacco: Never Used  . Alcohol use No    Family History Family History  Problem Relation Age of Onset  . Diabetes Mother   . Hypertension Mother   . Hyperlipidemia Mother   . Heart disease Mother        NOT before age 45   . Heart attack Mother   . Stomach cancer Father     Surgical History Past Surgical History:  Procedure Laterality Date  . ABDOMINAL HYSTERECTOMY  Dec. 24, 2008    Allergies  Allergen Reactions  . Tramadol Other (See Comments)    Reaction:  Headaches     Current Outpatient Prescriptions  Medication Sig Dispense Refill  . clonazePAM (KLONOPIN) 0.5 MG tablet Take 0.5 mg by mouth daily as needed for anxiety.     Marland Kitchen glipiZIDE (GLUCOTROL) 5 MG tablet Take 5 mg by mouth 2 (two) times daily.     . hydrochlorothiazide (HYDRODIURIL) 25 MG tablet Take 25 mg by mouth daily.    Marland Kitchen HYDROcodone-acetaminophen (NORCO/VICODIN) 5-325 MG tablet Take 1 tablet by mouth every 6 (six) hours as needed for moderate pain.     Marland Kitchen losartan (COZAAR) 50 MG tablet Take 50 mg by mouth daily.    . Multiple Vitamin (MULITIVITAMIN WITH MINERALS) TABS Take 1 tablet by mouth daily.    . sertraline (ZOLOFT) 50 MG tablet Take 50 mg by mouth daily.    . simvastatin (ZOCOR) 10 MG tablet Take 10 mg by mouth at bedtime.     . diclofenac (VOLTAREN) 75 MG EC tablet Take 1 tablet (75 mg total) by mouth 2 (two) times daily. (Patient not taking: Reported on 04/20/2017) 20 tablet 0  . predniSONE (DELTASONE) 50 MG tablet Take 1 tablet by mouth daily with food (Patient not taking: Reported on 04/20/2017)  6 tablet 0   No current facility-administered medications for this visit.     Review of Systems : See HPI for pertinent positives and negatives.  Physical Examination  Vitals:   04/20/17 1324 04/20/17 1327  BP: 117/70 120/71  Pulse: 60   Resp: 20   Temp: (!) 97.3 F (36.3 C)   TempSrc: Oral   SpO2: 97%   Weight: 141 lb (64 kg)   Height: 5\' 4"  (1.626 m)    Body mass index is 24.2 kg/m.  General: WDWN female in NAD GAIT: normal Eyes: PERRLA Pulmonary: Respirations are non-labored, CTAB Cardiac: regular rhythm and rate, no detected murmur.  VASCULAR EXAM Carotid Bruits Left Right   Negative Negative    aorta is faintly palpable Radial pulses are 2+ palpable and equal.      LE Pulses LEFT RIGHT   POPLITEAL not palpable  not palpable   POSTERIOR TIBIAL 2+ palpable  2+ palpable    DORSALIS PEDIS  ANTERIOR TIBIAL 2+ palpable  2+ palpable     Gastrointestinal: soft, nontender, BS WNL, no r/g,no palpable masses.  Musculoskeletal: No muscle atrophy/wasting. M/S 5/5 throughout, Extremities without ischemic changes.  Neurologic: A&O X 3; Appropriate Affect,  Speech is normal CN 2-12 intact, Pain and light touch intact in extremities, Motor exam as listed above    Assessment: Morgan Davenport is a 56 y.o. female who has no history of stroke or TIA.  Fortunately her DM remains in good control and she was congratulated re this. Unfortunately she resumed smoking but expressed a desire to quit; see Plan. She is taking a statin.  Consider adding a daily 81 mg ASA, if there are no contraindications, to reduce her risk of CV events, will defer to her PCP.   DATA Carotid Duplex (04/20/17): 40-59% right internal carotid artery stenosis. <40%) left ICA stenosis. Bilateral vertebral artery flow is antegrade.  Bilateral subclavian artery waveforms are normal.  No significant change  since exams of 04/09/2015 and 04/14/16.  Plan:  The patient was counseled re smoking cessation and given several free resources re smoking cessation.    I discussed in depth with the patient the nature of atherosclerosis, and emphasized the importance of maximal medical management including strict control of blood pressure, blood glucose, and lipid levels, obtaining regular exercise, and cessation of smoking.  The patient is aware that without maximal medical management the underlying atherosclerotic disease process will  progress, limiting the benefit of any interventions. The patient was given information about stroke prevention and what symptoms should prompt the patient to seek immediate medical care. Thank you for allowing [place] to participate in this patient's care.  Clemon Chambers, RN, MSN, FNP-C [facility] Office: [facility] Physician: Trula Slade  04/20/17 1:29 PM

## 2017-06-02 ENCOUNTER — Other Ambulatory Visit: Payer: Self-pay | Admitting: Family Medicine

## 2017-06-02 DIAGNOSIS — Z1231 Encounter for screening mammogram for malignant neoplasm of breast: Secondary | ICD-10-CM

## 2017-06-29 ENCOUNTER — Ambulatory Visit
Admission: RE | Admit: 2017-06-29 | Discharge: 2017-06-29 | Disposition: A | Discharge status: Home / Self Care | Payer: BLUE CROSS/BLUE SHIELD | Source: Ambulatory Visit | Attending: Family Medicine | Admitting: Family Medicine

## 2017-06-29 DIAGNOSIS — Z1231 Encounter for screening mammogram for malignant neoplasm of breast: Secondary | ICD-10-CM

## 2018-04-21 ENCOUNTER — Other Ambulatory Visit: Payer: Self-pay

## 2018-04-21 DIAGNOSIS — I6521 Occlusion and stenosis of right carotid artery: Secondary | ICD-10-CM

## 2018-05-18 ENCOUNTER — Encounter (HOSPITAL_COMMUNITY): Payer: BLUE CROSS/BLUE SHIELD

## 2018-05-18 ENCOUNTER — Ambulatory Visit: Payer: BLUE CROSS/BLUE SHIELD | Admitting: Family

## 2018-06-07 ENCOUNTER — Ambulatory Visit (HOSPITAL_COMMUNITY)
Admission: RE | Admit: 2018-06-07 | Discharge: 2018-06-07 | Disposition: A | Discharge status: Home / Self Care | Payer: BLUE CROSS/BLUE SHIELD | Source: Ambulatory Visit | Attending: Surgery | Admitting: Surgery

## 2018-06-07 ENCOUNTER — Encounter: Payer: Self-pay | Admitting: Family

## 2018-06-07 ENCOUNTER — Ambulatory Visit: Payer: BLUE CROSS/BLUE SHIELD | Admitting: Family

## 2018-06-07 ENCOUNTER — Other Ambulatory Visit: Payer: Self-pay

## 2018-06-07 VITALS — BP 143/77 | HR 58 | Resp 18 | Ht 64.0 in | Wt 136.0 lb

## 2018-06-07 DIAGNOSIS — F17200 Nicotine dependence, unspecified, uncomplicated: Secondary | ICD-10-CM | POA: Insufficient documentation

## 2018-06-07 DIAGNOSIS — I6523 Occlusion and stenosis of bilateral carotid arteries: Secondary | ICD-10-CM

## 2018-06-07 DIAGNOSIS — I6521 Occlusion and stenosis of right carotid artery: Secondary | ICD-10-CM

## 2018-06-07 NOTE — Patient Instructions (Signed)
Steps to Quit Smoking Smoking tobacco can be bad for your health. It can also affect almost every organ in your body. Smoking puts you and people around you at risk for many serious long-lasting (chronic) diseases. Quitting smoking is hard, but it is one of the best things that you can do for your health. It is never too late to quit. What are the benefits of quitting smoking? When you quit smoking, you lower your risk for getting serious diseases and conditions. They can include:  Lung cancer or lung disease.  Heart disease.  Stroke.  Heart attack.  Not being able to have children (infertility).  Weak bones (osteoporosis) and broken bones (fractures).  If you have coughing, wheezing, and shortness of breath, those symptoms may get better when you quit. You may also get sick less often. If you are pregnant, quitting smoking can help to lower your chances of having a baby of low birth weight. What can I do to help me quit smoking? Talk with your doctor about what can help you quit smoking. Some things you can do (strategies) include:  Quitting smoking totally, instead of slowly cutting back how much you smoke over a period of time.  Going to in-person counseling. You are more likely to quit if you go to many counseling sessions.  Using resources and support systems, such as: ? Online chats with a counselor. ? Phone quitlines. ? Printed self-help materials. ? Support groups or group counseling. ? Text messaging programs. ? Mobile phone apps or applications.  Taking medicines. Some of these medicines may have nicotine in them. If you are pregnant or breastfeeding, do not take any medicines to quit smoking unless your doctor says it is okay. Talk with your doctor about counseling or other things that can help you.  Talk with your doctor about using more than one strategy at the same time, such as taking medicines while you are also going to in-person counseling. This can help make  quitting easier. What things can I do to make it easier to quit? Quitting smoking might feel very hard at first, but there is a lot that you can do to make it easier. Take these steps:  Talk to your family and friends. Ask them to support and encourage you.  Call phone quitlines, reach out to support groups, or work with a counselor.  Ask people who smoke to not smoke around you.  Avoid places that make you want (trigger) to smoke, such as: ? Bars. ? Parties. ? Smoke-break areas at work.  Spend time with people who do not smoke.  Lower the stress in your life. Stress can make you want to smoke. Try these things to help your stress: ? Getting regular exercise. ? Deep-breathing exercises. ? Yoga. ? Meditating. ? Doing a body scan. To do this, close your eyes, focus on one area of your body at a time from head to toe, and notice which parts of your body are tense. Try to relax the muscles in those areas.  Download or buy apps on your mobile phone or tablet that can help you stick to your quit plan. There are many free apps, such as QuitGuide from the CDC (Centers for Disease Control and Prevention). You can find more support from smokefree.gov and other websites.  This information is not intended to replace advice given to you by your health care provider. Make sure you discuss any questions you have with your health care provider. Document Released: 07/12/2009 Document   Revised: 05/13/2016 Document Reviewed: 01/30/2015 Elsevier Interactive Patient Education  2018 Elsevier Inc.     Stroke Prevention Some health problems and behaviors may make it more likely for you to have a stroke. Below are ways to lessen your risk of having a stroke.  Be active for at least 30 minutes on most or all days.  Do not smoke. Try not to be around others who smoke.  Do not drink too much alcohol. ? Do not have more than 2 drinks a day if you are a man. ? Do not have more than 1 drink a day if you  are a woman and are not pregnant.  Eat healthy foods, such as fruits and vegetables. If you were put on a specific diet, follow the diet as told.  Keep your cholesterol levels under control through diet and medicines. Look for foods that are low in saturated fat, trans fat, cholesterol, and are high in fiber.  If you have diabetes, follow all diet plans and take your medicine as told.  Ask your doctor if you need treatment to lower your blood pressure. If you have high blood pressure (hypertension), follow all diet plans and take your medicine as told by your doctor.  If you are 18-39 years old, have your blood pressure checked every 3-5 years. If you are age 40 or older, have your blood pressure checked every year.  Keep a healthy weight. Eat foods that are low in calories, salt, saturated fat, trans fat, and cholesterol.  Do not take drugs.  Avoid birth control pills, if this applies. Talk to your doctor about the risks of taking birth control pills.  Talk to your doctor if you have sleep problems (sleep apnea).  Take all medicine as told by your doctor. ? You may be told to take aspirin or blood thinner medicine. Take this medicine as told by your doctor. ? Understand your medicine instructions.  Make sure any other conditions you have are being taken care of.  Get help right away if:  You suddenly lose feeling (you feel numb) or have weakness in your face, arm, or leg.  Your face or eyelid hangs down to one side.  You suddenly feel confused.  You have trouble talking (aphasia) or understanding what people are saying.  You suddenly have trouble seeing in one or both eyes.  You suddenly have trouble walking.  You are dizzy.  You lose your balance or your movements are clumsy (uncoordinated).  You suddenly have a very bad headache and you do not know the cause.  You have new chest pain.  Your heart feels like it is fluttering or skipping a beat (irregular  heartbeat). Do not wait to see if the symptoms above go away. Get help right away. Call your local emergency services (911 in [place] Do not drive yourself to the hospital. This information is not intended to replace advice given to you by your health care provider. Make sure you discuss any questions you have with your health care provider. Document Released: 03/16/2012 Document Revised: 02/21/2016 Document Reviewed: 03/18/2013 Elsevier Interactive Patient Education  2018 Elsevier Inc.  

## 2018-06-07 NOTE — Progress Notes (Signed)
Chief Complaint: Follow up Extracranial Carotid Artery Stenosis   History of Present Illness  Bryttani Blew is a 57 y.o. female whom Dr. Trula Slade has been monitoring for extracranial carotid artery occlusive disease.  She was initially seen in 2011. At that time, she had a head CT scan for headaches. One of the findings was concerning for subarachnoid hemorrhage. She underwent an arteriogram that did not show any evidence of aneurysm. There was however 35-40% stenosis of the right internal carotid artery and 40% stenosis of the left subclavian artery. It was later felt that the head CT scan was more consistent with an area of hypodensity and possibly calcification rather than subarachnoid hemorrhage. She experienced headaches and dizziness with this which she still has intermittently.   She denies history of hemiparesis, denies sudden onset confusion with slurred speech or aphasia, denies sudden loss of vision.  Pt denies claudication symptoms with walking, denies non healing wounds.  Patient has not had previous carotid artery intervention.  Pt Diabetic: Yes, 6.3 she states was her last A1C, improved from 6.7 Pt smoker: smoker (6-7 cigarettes/day, started at age 49 yrs, quit, then resumed). Pt states she has an appointment with her PCP in August 2017.  Pt meds include: Statin : yes ASA: No Other anticoagulants/antiplatelets: no    Past Medical History:  Diagnosis Date  . Carotid artery occlusion   . Depression   . Diabetes mellitus   . Headache   . Hypertension     Social History Social History   Tobacco Use  . Smoking status: Light Tobacco Smoker    Packs/day: 0.25    Years: 10.00    Pack years: 2.50    Types: Cigarettes  . Smokeless tobacco: Never Used  Substance Use Topics  . Alcohol use: No    Alcohol/week: 0.0 standard drinks  . Drug use: No    Family History Family History  Adopted: Yes  Problem Relation Age of Onset  . Diabetes Mother   .  Hypertension Mother   . Hyperlipidemia Mother   . Heart disease Mother        NOT before age 73  . Heart attack Mother   . Stomach cancer Father     Surgical History Past Surgical History:  Procedure Laterality Date  . ABDOMINAL HYSTERECTOMY  Dec. 24, 2008    Allergies  Allergen Reactions  . Tramadol Other (See Comments)    Reaction:  Headaches     Current Outpatient Medications  Medication Sig Dispense Refill  . clonazePAM (KLONOPIN) 0.5 MG tablet Take 0.5 mg by mouth daily as needed for anxiety.     Marland Kitchen glipiZIDE (GLUCOTROL) 5 MG tablet Take 5 mg by mouth 2 (two) times daily.     . hydrochlorothiazide (HYDRODIURIL) 25 MG tablet Take 25 mg by mouth daily.    Marland Kitchen HYDROcodone-acetaminophen (NORCO/VICODIN) 5-325 MG tablet Take 1 tablet by mouth every 6 (six) hours as needed for moderate pain.     Marland Kitchen losartan (COZAAR) 50 MG tablet Take 50 mg by mouth daily.    . Multiple Vitamin (MULITIVITAMIN WITH MINERALS) TABS Take 1 tablet by mouth daily.    . sertraline (ZOLOFT) 50 MG tablet Take 50 mg by mouth daily.    . simvastatin (ZOCOR) 10 MG tablet Take 10 mg by mouth at bedtime.     . diclofenac (VOLTAREN) 75 MG EC tablet Take 1 tablet (75 mg total) by mouth 2 (two) times daily. (Patient not taking: Reported on 04/20/2017) 20 tablet  0  . predniSONE (DELTASONE) 50 MG tablet Take 1 tablet by mouth daily with food (Patient not taking: Reported on 04/20/2017) 6 tablet 0   No current facility-administered medications for this visit.     Review of Systems : See HPI for pertinent positives and negatives.  Physical Examination  Vitals:   06/07/18 1421 06/07/18 1422  BP: (!) 143/76 (!) 143/77  Pulse: (!) 58   Resp: 18   SpO2: 97%   Weight: 136 lb (61.7 kg)   Height: 5\' 4"  (1.626 m)    Body mass index is 23.34 kg/m.  General: WDWN female in NAD GAIT:normal    HENT: No gross abnormalities.  Eyes: PERRLA Pulmonary: Respirations are non-labored, CTAB Cardiac: regular rhythm, slightly  bradycardic (not on a beta blocker), no detected murmur.  VASCULAR EXAM Carotid Bruits Right Left   Negative Negative   Abdominal aortic pulse is not palpable Radial pulses are 2+ palpable and equal.      LE Pulses Right Left   POPLITEAL not palpable  not palpable   POSTERIOR TIBIAL 2+ palpable  2+ palpable    DORSALIS PEDIS  ANTERIOR TIBIAL 2+ palpable  2+ palpable    Gastrointestinal: soft, nontender, BS WNL, no r/g,no palpable masses. Musculoskeletal: No muscle atrophy/wasting. M/S 5/5 throughout, Extremities without ischemic changes. Skin: No rashes, no ulcers, no cellulitis.   Neurologic:  A&O X 3; appropriate affect, sensation is normal; speech is normal, CN 2-12 intact, pain and light touch intact in extremities, motor exam as listed above. Psychiatric: Normal thought content, mood appropriate to clinical situation.    Assessment: Brittny Spangle is a 57 y.o. female who has no history of stroke or TIA.  Fortunately her DM remains in good control and she was congratulated re this. Unfortunately she resumed smoking but expressed a desire to quit; see Plan. She is taking a statin.  Will add a daily 81 mg ASA since there are no contraindications, to reduce her risk of CV events.  DATA Carotid Duplex (06-07-18): Right ICA: 40-59% stenosis Left ICA: 40-59% stenosis Bilateral vertebral artery flow is antegrade.  Bilateral subclavian artery waveforms are normal.  Increased stenosis in the left ICA compared to the exam on 04-20-17.    Plan:  The patient was counseled re smoking cessation and given several free resources re smoking cessation.  Follow-up in 1 year with Carotid Duplex scan.   I discussed in depth with the patient the nature of atherosclerosis, and emphasized the  importance of maximal medical management including strict control of blood pressure, blood glucose, and lipid levels, obtaining regular exercise, and cessation of smoking.  The patient is aware that without maximal medical management the underlying atherosclerotic disease process will progress, limiting the benefit of any interventions. The patient was given information about stroke prevention and what symptoms should prompt the patient to seek immediate medical care. Thank you for allowing [place] to participate in this patient's care.  Clemon Chambers, RN, MSN, FNP-C [facility] Office: [facility] Physician: Trula Slade  06/07/18 2:23 PM

## 2018-06-10 ENCOUNTER — Other Ambulatory Visit: Payer: Self-pay | Admitting: Family Medicine

## 2018-06-10 DIAGNOSIS — Z1231 Encounter for screening mammogram for malignant neoplasm of breast: Secondary | ICD-10-CM

## 2018-06-22 ENCOUNTER — Encounter (HOSPITAL_COMMUNITY): Payer: BLUE CROSS/BLUE SHIELD

## 2018-06-22 ENCOUNTER — Ambulatory Visit: Payer: BLUE CROSS/BLUE SHIELD | Admitting: Family

## 2018-07-05 ENCOUNTER — Ambulatory Visit
Admission: RE | Admit: 2018-07-05 | Discharge: 2018-07-05 | Disposition: A | Discharge status: Home / Self Care | Payer: BLUE CROSS/BLUE SHIELD | Source: Ambulatory Visit | Attending: Family Medicine | Admitting: Family Medicine

## 2018-07-05 DIAGNOSIS — Z1231 Encounter for screening mammogram for malignant neoplasm of breast: Secondary | ICD-10-CM

## 2019-03-07 ENCOUNTER — Other Ambulatory Visit: Payer: Self-pay | Admitting: *Deleted

## 2019-03-07 DIAGNOSIS — Z20822 Contact with and (suspected) exposure to covid-19: Secondary | ICD-10-CM

## 2019-03-10 LAB — NOVEL CORONAVIRUS, NAA: SARS-CoV-2, NAA: NOT DETECTED

## 2019-04-07 ENCOUNTER — Emergency Department (HOSPITAL_COMMUNITY)
Admission: EM | Admit: 2019-04-07 | Discharge: 2019-04-07 | Disposition: A | Discharge status: Home / Self Care | Payer: BC Managed Care – PPO | Attending: Emergency Medicine | Admitting: Emergency Medicine

## 2019-04-07 ENCOUNTER — Other Ambulatory Visit: Payer: Self-pay

## 2019-04-07 ENCOUNTER — Encounter (HOSPITAL_COMMUNITY): Payer: Self-pay | Admitting: Emergency Medicine

## 2019-04-07 DIAGNOSIS — M7918 Myalgia, other site: Secondary | ICD-10-CM | POA: Insufficient documentation

## 2019-04-07 DIAGNOSIS — Z7984 Long term (current) use of oral hypoglycemic drugs: Secondary | ICD-10-CM | POA: Diagnosis not present

## 2019-04-07 DIAGNOSIS — R5383 Other fatigue: Secondary | ICD-10-CM | POA: Diagnosis present

## 2019-04-07 DIAGNOSIS — R0981 Nasal congestion: Secondary | ICD-10-CM | POA: Insufficient documentation

## 2019-04-07 DIAGNOSIS — B349 Viral infection, unspecified: Secondary | ICD-10-CM | POA: Diagnosis not present

## 2019-04-07 DIAGNOSIS — Z79899 Other long term (current) drug therapy: Secondary | ICD-10-CM | POA: Insufficient documentation

## 2019-04-07 DIAGNOSIS — I10 Essential (primary) hypertension: Secondary | ICD-10-CM | POA: Insufficient documentation

## 2019-04-07 DIAGNOSIS — R0602 Shortness of breath: Secondary | ICD-10-CM | POA: Diagnosis not present

## 2019-04-07 DIAGNOSIS — Z20828 Contact with and (suspected) exposure to other viral communicable diseases: Secondary | ICD-10-CM | POA: Diagnosis not present

## 2019-04-07 DIAGNOSIS — F17210 Nicotine dependence, cigarettes, uncomplicated: Secondary | ICD-10-CM | POA: Diagnosis not present

## 2019-04-07 DIAGNOSIS — E119 Type 2 diabetes mellitus without complications: Secondary | ICD-10-CM | POA: Diagnosis not present

## 2019-04-07 NOTE — ED Provider Notes (Addendum)
[facility] EMERGENCY DEPARTMENT Provider Note   CSN: [id number] Arrival date & time: 04/07/19  1010    History   Chief Complaint No chief complaint on file.   HPI Morgan Davenport is a 58 y.o. female.     HPI   58 year old female presents today with complaints of fatigue.  Patient notes Morgan Davenport woke up this morning Morgan Davenport was having hot and cold chills, runny nose, body aches and minor shortness of breath.  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  Morgan Davenport notes Morgan Davenport was exposed to a coworker who had [place].  Morgan Davenport smokes cigarettes has a history of diabetes and hypertension.  Denies any history DVT, PE, lower extremity swelling edema or any other significant risk factors.  No chest pain.  Past Medical History:  Diagnosis Date  . Carotid artery occlusion   . Depression   . Diabetes mellitus   . Headache   . Hypertension     Patient Active Problem List   Diagnosis Date Noted  . Chronic migraine 10/02/2015  . Occlusion and stenosis of carotid artery without mention of cerebral infarction 03/28/2013    Past Surgical History:  Procedure Laterality Date  . ABDOMINAL HYSTERECTOMY  Dec. 24, 2008     OB History   No obstetric history on file.      Home Medications    Prior to Admission medications   Medication Sig Start Date End Date Taking? Authorizing Provider  clonazePAM (KLONOPIN) 0.5 MG tablet Take 0.5 mg by mouth daily as needed for anxiety.     [provider]  diclofenac (VOLTAREN) 75 MG EC tablet Take 1 tablet (75 mg total) by mouth 2 (two) times daily. Patient not taking: Reported on 04/20/2017 12/28/16   Barnet Glasgow, NP  glipiZIDE (GLUCOTROL) 5 MG tablet Take 5 mg by mouth 2 (two) times daily.     [provider]  hydrochlorothiazide (HYDRODIURIL) 25 MG tablet Take 25 mg by mouth daily.    [provider]  HYDROcodone-acetaminophen (NORCO/VICODIN) 5-325 MG tablet Take 1 tablet by mouth every 6 (six) hours as needed  for moderate pain.     [provider]  losartan (COZAAR) 50 MG tablet Take 50 mg by mouth daily.    [provider]  Multiple Vitamin (MULITIVITAMIN WITH MINERALS) TABS Take 1 tablet by mouth daily.    [provider]  predniSONE (DELTASONE) 50 MG tablet Take 1 tablet by mouth daily with food Patient not taking: Reported on 04/20/2017 12/28/16   Barnet Glasgow, NP  sertraline (ZOLOFT) 50 MG tablet Take 50 mg by mouth daily.    [provider]  simvastatin (ZOCOR) 10 MG tablet Take 10 mg by mouth at bedtime.     [provider]    Family History Family History  Adopted: Yes  Problem Relation Age of Onset  . Diabetes Mother   . Hypertension Mother   . Hyperlipidemia Mother   . Heart disease Mother        NOT before age 21  . Heart attack Mother   . Stomach cancer Father     Social History Social History   Tobacco Use  . Smoking status: Light Tobacco Smoker    Packs/day: 0.25    Years: 10.00    Pack years: 2.50    Types: Cigarettes  . Smokeless tobacco: Never Used  Substance Use Topics  . Alcohol use: No    Alcohol/week: 0.0 standard drinks  . Drug use: No  Allergies   Tramadol   Review of Systems Review of Systems  All other systems reviewed and are negative.    Physical Exam Updated Vital Signs BP 122/72 (BP Location: Left Arm)   Pulse 72   Temp 98.7 F (37.1 C) (Oral)   Resp 16   SpO2 100%   Physical Exam Vitals signs and nursing note reviewed.  Constitutional:      Appearance: Morgan Davenport is well-developed.  HENT:     Head: Normocephalic and atraumatic.  Eyes:     General: No scleral icterus.       Right eye: No discharge.        Left eye: No discharge.     Conjunctiva/sclera: Conjunctivae normal.     Pupils: Pupils are equal, round, and reactive to light.  Neck:     Musculoskeletal: Normal range of motion.     Vascular: No JVD.     Trachea: No tracheal deviation.  Cardiovascular:     Rate and Rhythm:  Normal rate and regular rhythm.  Pulmonary:     Effort: Pulmonary effort is normal. No respiratory distress.     Breath sounds: No stridor. No wheezing, rhonchi or rales.  Musculoskeletal:     Comments: No edema  Neurological:     Mental Status: Morgan Davenport is alert and oriented to person, place, and time.     Coordination: Coordination normal.  Psychiatric:        Behavior: Behavior normal.        Thought Content: Thought content normal.        Judgment: Judgment normal.      ED Treatments / Results  Labs (all labs ordered are listed, but only abnormal results are displayed) Labs Reviewed  NOVEL CORONAVIRUS, NAA (HOSPITAL ORDER, SEND-OUT TO REF LAB)    EKG None  Radiology No results found.  Procedures Procedures (including critical care time)  Medications Ordered in ED Medications - No data to display   Initial Impression / Assessment and Plan / ED Course  I have reviewed the triage vital signs and the nursing notes.  Pertinent labs & imaging results that were available during my care of the patient were reviewed by me and considered in my medical decision making (see chart for details).        58 year old female presents today with likely viral illness.  Concern for COVID given her recent exposure.  Morgan Davenport will have outpatient Cobra testing as Morgan Davenport has no signs of significant lower respiratory involvement.  Morgan Davenport is well-appearing in no acute distress.  Return precautions given.  Morgan Davenport verbalized understanding and agreement to this plan had no further questions concerns the time of discharge.  Morgan Davenport was evaluated in Emergency Department on 04/07/2019 for the symptoms described in the history of present illness. Morgan Davenport was evaluated in the context of the global COVID-19 pandemic, which necessitated consideration that the patient might be at risk for infection with the SARS-CoV-2 virus that causes COVID-19. Institutional protocols and algorithms that pertain to the evaluation  of patients at risk for COVID-19 are in a state of rapid change based on information released by regulatory bodies including the CDC and federal and state organizations. These policies and algorithms were followed during the patient's care in the ED.  Final Clinical Impressions(s) / ED Diagnoses   Final diagnoses:  Viral illness    ED Discharge Orders    None       Francee Gentile 04/07/19 1159    Chariah Bailey, Dellis Filbert, PA-C  04/07/19 1200    Tegeler, Gwenyth Allegra, MD 04/07/19 [phone]

## 2019-04-07 NOTE — ED Triage Notes (Signed)
Pt here with c/o, sob  fever and fatigue , pt works at [employer] and just feels bad

## 2019-04-07 NOTE — Discharge Instructions (Addendum)
Please read attached information. If you experience any new or worsening signs or symptoms please return to the emergency room for evaluation. Please follow-up with your primary care provider or specialist as discussed. Please use medication prescribed only as directed and discontinue taking if you have any concerning signs or symptoms.  You may also call our virtual care hotline at [phone] if he have any questions or concerns.

## 2019-04-09 LAB — NOVEL CORONAVIRUS, NAA (HOSP ORDER, SEND-OUT TO REF LAB; TAT 18-24 HRS): SARS-CoV-2, NAA: NOT DETECTED

## 2019-05-06 ENCOUNTER — Other Ambulatory Visit: Payer: Self-pay

## 2019-05-06 DIAGNOSIS — Z20822 Contact with and (suspected) exposure to covid-19: Secondary | ICD-10-CM

## 2019-05-07 LAB — NOVEL CORONAVIRUS, NAA: SARS-CoV-2, NAA: NOT DETECTED

## 2019-07-21 ENCOUNTER — Encounter (HOSPITAL_COMMUNITY): Payer: Self-pay | Admitting: Emergency Medicine

## 2019-07-21 ENCOUNTER — Observation Stay (HOSPITAL_COMMUNITY)
Admission: RE | Admit: 2019-07-21 | Discharge: 2019-07-22 | Disposition: A | Discharge status: Other Acute Inpatient Hospital | Payer: BC Managed Care – PPO | Attending: Psychiatry | Admitting: Psychiatry

## 2019-07-21 ENCOUNTER — Other Ambulatory Visit: Payer: Self-pay

## 2019-07-21 DIAGNOSIS — Z20828 Contact with and (suspected) exposure to other viral communicable diseases: Secondary | ICD-10-CM | POA: Diagnosis not present

## 2019-07-21 DIAGNOSIS — I10 Essential (primary) hypertension: Secondary | ICD-10-CM | POA: Diagnosis not present

## 2019-07-21 DIAGNOSIS — F329 Major depressive disorder, single episode, unspecified: Secondary | ICD-10-CM | POA: Diagnosis present

## 2019-07-21 DIAGNOSIS — G4700 Insomnia, unspecified: Secondary | ICD-10-CM | POA: Diagnosis not present

## 2019-07-21 DIAGNOSIS — Z915 Personal history of self-harm: Secondary | ICD-10-CM | POA: Insufficient documentation

## 2019-07-21 DIAGNOSIS — Z8249 Family history of ischemic heart disease and other diseases of the circulatory system: Secondary | ICD-10-CM | POA: Insufficient documentation

## 2019-07-21 DIAGNOSIS — Z833 Family history of diabetes mellitus: Secondary | ICD-10-CM | POA: Insufficient documentation

## 2019-07-21 DIAGNOSIS — Z7984 Long term (current) use of oral hypoglycemic drugs: Secondary | ICD-10-CM | POA: Diagnosis not present

## 2019-07-21 DIAGNOSIS — Z885 Allergy status to narcotic agent status: Secondary | ICD-10-CM | POA: Diagnosis not present

## 2019-07-21 DIAGNOSIS — F419 Anxiety disorder, unspecified: Secondary | ICD-10-CM | POA: Diagnosis not present

## 2019-07-21 DIAGNOSIS — R45851 Suicidal ideations: Secondary | ICD-10-CM | POA: Diagnosis not present

## 2019-07-21 DIAGNOSIS — E079 Disorder of thyroid, unspecified: Secondary | ICD-10-CM | POA: Diagnosis not present

## 2019-07-21 DIAGNOSIS — Z79899 Other long term (current) drug therapy: Secondary | ICD-10-CM | POA: Diagnosis not present

## 2019-07-21 DIAGNOSIS — E119 Type 2 diabetes mellitus without complications: Secondary | ICD-10-CM | POA: Insufficient documentation

## 2019-07-21 DIAGNOSIS — E7800 Pure hypercholesterolemia, unspecified: Secondary | ICD-10-CM | POA: Insufficient documentation

## 2019-07-21 LAB — SARS CORONAVIRUS 2 BY RT PCR (HOSPITAL ORDER, PERFORMED IN ~~LOC~~ HOSPITAL LAB): SARS Coronavirus 2: NEGATIVE

## 2019-07-21 MED ORDER — MIRTAZAPINE 15 MG PO TABS
15.0000 mg | ORAL_TABLET | Freq: Every day | ORAL | Status: DC
  Administered 2019-07-21: 15 mg via ORAL
  Filled 2019-07-21 (×3): qty 1

## 2019-07-21 MED ORDER — GLIPIZIDE 5 MG PO TABS
5.0000 mg | ORAL_TABLET | Freq: Two times a day (BID) | ORAL | Status: DC
  Administered 2019-07-22: 5 mg via ORAL
  Filled 2019-07-21 (×5): qty 1

## 2019-07-21 MED ORDER — SERTRALINE HCL 100 MG PO TABS
150.0000 mg | ORAL_TABLET | Freq: Every day | ORAL | Status: DC
  Administered 2019-07-22: 150 mg via ORAL
  Filled 2019-07-21: qty 1.5
  Filled 2019-07-21: qty 2
  Filled 2019-07-21 (×2): qty 1.5

## 2019-07-21 MED ORDER — SIMVASTATIN 5 MG PO TABS
10.0000 mg | ORAL_TABLET | Freq: Every day | ORAL | Status: DC
  Administered 2019-07-21: 10 mg via ORAL
  Filled 2019-07-21 (×2): qty 1
  Filled 2019-07-21: qty 2

## 2019-07-21 MED ORDER — HYDROCHLOROTHIAZIDE 25 MG PO TABS
25.0000 mg | ORAL_TABLET | Freq: Every day | ORAL | Status: DC
  Administered 2019-07-22: 25 mg via ORAL
  Filled 2019-07-21 (×4): qty 1

## 2019-07-21 MED ORDER — CLONAZEPAM 0.5 MG PO TABS
0.5000 mg | ORAL_TABLET | Freq: Every day | ORAL | Status: AC
  Filled 2019-07-21: qty 1
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
## 2019-07-21 NOTE — Progress Notes (Signed)
Patient ID: Morgan Davenport, female   DOB: [birth year], 58 y.o.   MRN: [id number] Pt A&O x 4, presents with SI, plan to overdose on medications.  Pt states, I would be better off dead.  Pt also has access to guns in home..  Skin search completed.  Pt calm & cooperative, no distress noted.  Monitoring for safety.
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
## 2019-07-21 NOTE — Care Management (Signed)
:    Under review [place], [place], [place], [employer], [place], [address], [employer], [place], [place], [place], [place], [place], [place], [place], [place], [place], [place], [place], [place], [place], [place], [place], [place], [place], [place], [place], [employer], [place], [place], [place], [facility], [place], [place]

## 2019-07-21 NOTE — Plan of Care (Signed)
[facility] Observation Crisis Plan  Reason for Crisis Plan:  Crisis Stabilization   Plan of Care:  Referral for Inpatient Hospitalization  Family Support:      Current Living Environment:  Living Arrangements: Spouse/significant other  Insurance:   Hospital Account    Name Acct ID Class Status Primary Coverage   Morgan Davenport, Morgan Davenport [id number] [place] PPO        Guarantor Account (for Hospital Account [id number])    Name Relation to Pt Service Area Active? Morgan Davenport Yes Behavioral Health   Address Phone       [address] [place], [place] [address] [phone])          Coverage Information (for Hospital Account [id number])    F/O Payor/Plan Precert #   [facility] SHIELD/BCBS COMM PPO    Subscriber Subscriber #   Morgan Davenport, Morgan Davenport [id number]   Address Phone   [address] [place], [place] [address] [phone]      Legal Guardian:  Legal Guardian: (self)  Primary Care Provider:  Kelton Pillar, MD  Current Outpatient Providers:  Morgan Abbot, MD  Psychiatrist:  Name of Psychiatrist: Dr. Altamese [place]  Counselor/Therapist:  Name of Therapist: none  Compliant with Medications:  No  Additional Information:   Morgan Davenport 10/22/202011:48 PM
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
## 2019-07-22 ENCOUNTER — Encounter (HOSPITAL_COMMUNITY): Payer: Self-pay

## 2019-07-22 ENCOUNTER — Inpatient Hospital Stay (HOSPITAL_COMMUNITY)
Admission: AD | Admit: 2019-07-22 | Discharge: 2019-07-24 | DRG: 881 | Disposition: A | Discharge status: Home / Self Care | Payer: BC Managed Care – PPO | Source: Intra-hospital | Attending: Psychiatry | Admitting: Psychiatry

## 2019-07-22 DIAGNOSIS — F32A Depression, unspecified: Secondary | ICD-10-CM | POA: Diagnosis present

## 2019-07-22 DIAGNOSIS — I10 Essential (primary) hypertension: Secondary | ICD-10-CM | POA: Diagnosis present

## 2019-07-22 DIAGNOSIS — F329 Major depressive disorder, single episode, unspecified: Secondary | ICD-10-CM | POA: Diagnosis present

## 2019-07-22 DIAGNOSIS — R45851 Suicidal ideations: Secondary | ICD-10-CM | POA: Diagnosis present

## 2019-07-22 DIAGNOSIS — F17210 Nicotine dependence, cigarettes, uncomplicated: Secondary | ICD-10-CM | POA: Diagnosis present

## 2019-07-22 DIAGNOSIS — E119 Type 2 diabetes mellitus without complications: Secondary | ICD-10-CM | POA: Diagnosis present

## 2019-07-22 DIAGNOSIS — Z20828 Contact with and (suspected) exposure to other viral communicable diseases: Secondary | ICD-10-CM | POA: Diagnosis present

## 2019-07-22 DIAGNOSIS — F332 Major depressive disorder, recurrent severe without psychotic features: Secondary | ICD-10-CM | POA: Diagnosis not present

## 2019-07-22 LAB — CBC
HCT: 43.2 % (ref 36.0–46.0)
Hemoglobin: 13.4 g/dL (ref 12.0–15.0)
MCH: 27.5 pg (ref 26.0–34.0)
MCHC: 31 g/dL (ref 30.0–36.0)
MCV: 88.5 fL (ref 80.0–100.0)
Platelets: 290 10*3/uL (ref 150–400)
RBC: 4.88 MIL/uL (ref 3.87–5.11)
RDW: 14 % (ref 11.5–15.5)
WBC: 8.5 10*3/uL (ref 4.0–10.5)
nRBC: 0 % (ref 0.0–0.2)

## 2019-07-22 LAB — COMPREHENSIVE METABOLIC PANEL
ALT: 21 U/L (ref 0–44)
AST: 22 U/L (ref 15–41)
Albumin: 4 g/dL (ref 3.5–5.0)
Alkaline Phosphatase: 70 U/L (ref 38–126)
Anion gap: 7 (ref 5–15)
BUN: 17 mg/dL (ref 6–20)
CO2: 30 mmol/L (ref 22–32)
Calcium: 9.9 mg/dL (ref 8.9–10.3)
Chloride: 107 mmol/L (ref 98–111)
Creatinine, Ser: 1.05 mg/dL — ABNORMAL HIGH (ref 0.44–1.00)
GFR calc Af Amer: >60 mL/min (ref >60)
GFR calc non Af Amer: 58 mL/min — ABNORMAL LOW (ref >60)
Glucose, Bld: 119 mg/dL — ABNORMAL HIGH (ref 70–99)
Potassium: 4.1 mmol/L (ref 3.5–5.1)
Sodium: 144 mmol/L (ref 135–145)
Total Bilirubin: 0.6 mg/dL (ref 0.3–1.2)
Total Protein: 7.3 g/dL (ref 6.5–8.1)

## 2019-07-22 LAB — RAPID URINE DRUG SCREEN, HOSP PERFORMED
Amphetamines: NOT DETECTED
Barbiturates: NOT DETECTED
Benzodiazepines: NOT DETECTED
Cocaine: POSITIVE — AB
Opiates: NOT DETECTED
Tetrahydrocannabinol: POSITIVE — AB

## 2019-07-22 LAB — LIPID PANEL
Cholesterol: 109 mg/dL (ref 0–200)
HDL: 51 mg/dL (ref >40)
LDL Cholesterol: 47 mg/dL (ref 0–99)
Total CHOL/HDL Ratio: 2.1 RATIO
Triglycerides: 57 mg/dL (ref <150)
VLDL: 11 mg/dL (ref 0–40)

## 2019-07-22 LAB — PREGNANCY, URINE: Preg Test, Ur: NEGATIVE

## 2019-07-22 LAB — HEMOGLOBIN A1C
Hgb A1c MFr Bld: 6 % — ABNORMAL HIGH (ref 4.8–5.6)
Mean Plasma Glucose: 125.5 mg/dL

## 2019-07-22 LAB — TSH: TSH: 1.12 u[IU]/mL (ref 0.350–4.500)

## 2019-07-22 LAB — ETHANOL: Alcohol, Ethyl (B): <10 mg/dL (ref <10)

## 2019-07-22 MED ORDER — MIRTAZAPINE 15 MG PO TABS
15.0000 mg | ORAL_TABLET | Freq: Every day | ORAL | Status: DC
  Administered 2019-07-22 – 2019-07-23 (×2): 15 mg via ORAL
  Filled 2019-07-22 (×5): qty 1

## 2019-07-22 MED ORDER — SIMVASTATIN 10 MG PO TABS
10.0000 mg | ORAL_TABLET | Freq: Every day | ORAL | Status: DC
  Administered 2019-07-22 – 2019-07-23 (×2): 10 mg via ORAL
  Filled 2019-07-22 (×4): qty 1

## 2019-07-22 MED ORDER — ALUM & MAG HYDROXIDE-SIMETH 200-200-20 MG/5ML PO SUSP: 30.0000 mL | ORAL | Status: DC | PRN

## 2019-07-22 MED ORDER — SIMVASTATIN 5 MG PO TABS
ORAL_TABLET | ORAL | Status: AC
  Filled 2019-07-22: qty 2

## 2019-07-22 MED ORDER — MAGNESIUM HYDROXIDE 400 MG/5ML PO SUSP: 30.0000 mL | Freq: Every day | ORAL | Status: DC | PRN

## 2019-07-22 MED ORDER — GLIPIZIDE 5 MG PO TABS
5.0000 mg | ORAL_TABLET | Freq: Two times a day (BID) | ORAL | Status: DC
  Administered 2019-07-22 – 2019-07-24 (×4): 5 mg via ORAL
  Filled 2019-07-22 (×9): qty 1

## 2019-07-22 MED ORDER — HYDROCHLOROTHIAZIDE 25 MG PO TABS
25.0000 mg | ORAL_TABLET | Freq: Every day | ORAL | Status: DC
  Administered 2019-07-23 – 2019-07-24 (×2): 25 mg via ORAL
  Filled 2019-07-22 (×5): qty 1

## 2019-07-22 MED ORDER — SERTRALINE HCL 50 MG PO TABS
150.0000 mg | ORAL_TABLET | Freq: Every day | ORAL | Status: DC
  Administered 2019-07-23 – 2019-07-24 (×2): 150 mg via ORAL
  Filled 2019-07-22 (×5): qty 1

## 2019-07-22 MED ORDER — ACETAMINOPHEN 325 MG PO TABS: 650.0000 mg | ORAL_TABLET | Freq: Four times a day (QID) | ORAL | Status: DC | PRN

## 2019-07-22 NOTE — Consult Note (Signed)
  Patient endorses suicidal ideations, "I'm getting better but I'm not where I want to be. Patient agrees with plan for inpatient admission, voluntary at this time.  Patient accepted to [facility] Adult unit, 501-1.

## 2019-07-22 NOTE — Tx Team (Cosign Needed)
Initial Treatment Plan 07/22/2019 3:55 PM Morgan Davenport [id number]    PATIENT STRESSORS: Medication change or noncompliance Substance abuse   PATIENT STRENGTHS: Ability for insight Average or above average intelligence Financial means Physical Health Supportive family/friends Work skills   PATIENT IDENTIFIED PROBLEMS: anxiety  depression  Substance abuse  Suicidal ideations               DISCHARGE CRITERIA:  Ability to meet basic life and health needs Improved stabilization in mood, thinking, and/or behavior Medical problems require only outpatient monitoring Motivation to continue treatment in a less acute level of care  PRELIMINARY DISCHARGE PLAN: Attend 12-step recovery group Outpatient therapy Placement in alternative living arrangements Return to previous work or school arrangements  PATIENT/FAMILY INVOLVEMENT: This treatment plan has been presented to and reviewed with the patient, Morgan Davenport.  The patient and family have been given the opportunity to ask questions and make suggestions.  Baron Sane, RN 07/22/2019, 3:55 PM

## 2019-07-22 NOTE — BH Assessment (Signed)
[facility] Assessment Progress Note  Per Hampton Abbot, MD, this voluntary pt requires psychiatric hospitalization at this time.  Nonah Mattes, RN, [facility] has assigned pt to [facility] Rm [id number].  Pt's nurse, Nicoletta Dress, has been notified.  Jalene Mullet, [place] Coordinator [phone]
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
## 2019-07-23 DIAGNOSIS — F332 Major depressive disorder, recurrent severe without psychotic features: Secondary | ICD-10-CM

## 2019-07-23 DIAGNOSIS — F329 Major depressive disorder, single episode, unspecified: Secondary | ICD-10-CM | POA: Diagnosis present

## 2019-07-23 DIAGNOSIS — F32A Depression, unspecified: Secondary | ICD-10-CM | POA: Diagnosis present

## 2019-07-23 MED ORDER — LOSARTAN POTASSIUM 50 MG PO TABS
50.0000 mg | ORAL_TABLET | Freq: Every day | ORAL | Status: DC
  Administered 2019-07-23 – 2019-07-24 (×2): 50 mg via ORAL
  Filled 2019-07-23 (×4): qty 1

## 2019-07-23 MED ORDER — FOLIC ACID 1 MG PO TABS
1.0000 mg | ORAL_TABLET | Freq: Every day | ORAL | Status: DC
  Administered 2019-07-23 – 2019-07-24 (×2): 1 mg via ORAL
  Filled 2019-07-23 (×4): qty 1

## 2019-07-23 MED ORDER — HYDROXYZINE HCL 25 MG PO TABS
25.0000 mg | ORAL_TABLET | Freq: Three times a day (TID) | ORAL | Status: DC | PRN
  Administered 2019-07-23: 25 mg via ORAL
  Filled 2019-07-23 (×2): qty 1

## 2019-07-23 MED ORDER — LORAZEPAM 1 MG PO TABS: 1.0000 mg | ORAL_TABLET | Freq: Four times a day (QID) | ORAL | Status: DC | PRN

## 2019-07-23 MED ORDER — TRAZODONE HCL 50 MG PO TABS: 50.0000 mg | ORAL_TABLET | Freq: Every evening | ORAL | Status: DC | PRN

## 2019-07-23 MED ORDER — VITAMIN B-1 100 MG PO TABS
100.0000 mg | ORAL_TABLET | Freq: Every day | ORAL | Status: DC
  Administered 2019-07-23 – 2019-07-24 (×2): 100 mg via ORAL
  Filled 2019-07-23 (×4): qty 1
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
## 2019-07-23 NOTE — Progress Notes (Signed)
Patient ID: Morgan Davenport, female   DOB: 1961/09/19, 58 y.o.   MRN: [id number]   [facility] NOVEL CORONAVIRUS (COVID-19) DAILY CHECK-OFF SYMPTOMS - answer yes or no to each - every day NO YES  Have you had a fever in the past 24 hours?  . Fever (Temp > 37.80C / 100F) X   Have you had any of these symptoms in the past 24 hours? . New Cough .  Sore Throat  .  Shortness of Breath .  Difficulty Breathing .  Unexplained Body Aches   X   Have you had any one of these symptoms in the past 24 hours not related to allergies?   . Runny Nose .  Nasal Congestion .  Sneezing   X   If you have had runny nose, nasal congestion, sneezing in the past 24 hours, has it worsened?  X   EXPOSURES - check yes or no X   Have you traveled outside the state in the past 14 days?  X   Have you been in contact with someone with a confirmed diagnosis of COVID-19 or PUI in the past 14 days without wearing appropriate PPE?  X   Have you been living in the same home as a person with confirmed diagnosis of COVID-19 or a PUI (household contact)?    X   Have you been diagnosed with COVID-19?    X              What to do next: Answered NO to all: Answered YES to anything:   Proceed with unit schedule Follow the [facility] Inpatient Flowsheet.
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
## 2019-07-23 NOTE — Progress Notes (Signed)
Adult Psychoeducational Group Note  Date:  07/23/2019 Time:  1:54 AM  Group Topic/Focus:  Wrap-Up Group:   The focus of this group is to help patients review their daily goal of treatment and discuss progress on daily workbooks.  Participation Level:  Active  Participation Quality:  Appropriate  Affect:  Appropriate  Cognitive:  Appropriate  Insight: Appropriate  Engagement in Group:  Developing/Improving  Modes of Intervention:  Discussion  Additional Comments:  Pt stated her goal for today was to focus on her treatment plan. Pt stated she felt she accomplished her goal today.  Pt stated her relationship with her family has improved since she was admitted here. Pt stated her husband coming for visitation today help improve her day.    Pt stated she felt about the same about herself today. Pt rated her overall day a 5 out of 10. Pt stated her appetite was pretty good today. Pt stated her goal for tonight is to get some sleep. Pt did not complain of any pain tonight. Pt stated she was not hearing voices or seeing anything that was not there.  Pt stated she had no thoughts of harming herself or others. Pt stated she would alert staff if anything changes  Candy Sledge 07/23/2019, 1:54 AM

## 2019-07-23 NOTE — Plan of Care (Signed)
  Problem: Activity: Goal: Interest or engagement in activities will improve Outcome: Progressing   Problem: Coping: Goal: Ability to demonstrate self-control will improve Outcome: Progressing   Problem: Safety: Goal: Periods of time without injury will increase Outcome: Progressing   

## 2019-07-23 NOTE — Progress Notes (Signed)
Patient ID: Morgan Davenport, female   DOB: 1961/08/04, 58 y.o.   MRN: [id number] D: Patient in her room most of the evening. Pt appears calm and cooperative. Pt denies any needs and stated she is happy to have room by herself. Denies  SI/HI/AVH and pain.No behavioral issues noted.  A: Support and encouragement offered as needed to express needs. Medications administered as prescribed.  R: Patient is safe and cooperative on unit. Will continue to monitor  for safety and stability.
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
## 2019-07-24 LAB — GLUCOSE, CAPILLARY: Glucose-Capillary: 112 mg/dL — ABNORMAL HIGH (ref 70–99)

## 2019-07-24 MED ORDER — HYDROXYZINE HCL 25 MG PO TABS: 25.0000 mg | ORAL_TABLET | Freq: Three times a day (TID) | ORAL | 0 refills | Status: DC | PRN

## 2019-07-24 MED ORDER — MIRTAZAPINE 15 MG PO TABS: 15.0000 mg | ORAL_TABLET | Freq: Every day | ORAL | 0 refills | Status: DC

## 2019-07-24 MED ORDER — TRAZODONE HCL 50 MG PO TABS: 50.0000 mg | ORAL_TABLET | Freq: Every evening | ORAL | 0 refills | Status: DC | PRN

## 2019-07-24 MED ORDER — SERTRALINE HCL 50 MG PO TABS: 150.0000 mg | ORAL_TABLET | Freq: Every day | ORAL | 0 refills | Status: AC

## 2019-07-24 NOTE — [facility] Group Notes (Signed)
[facility] Group Notes: (Clinical Social Work)   07/24/2019      Type of Therapy:  Group Therapy   Participation Level:  Did Not Attend - was discharged during group  Selmer Dominion, LCSW 07/24/2019, 1:17 PM

## 2019-07-24 NOTE — Progress Notes (Signed)
  [facility] Adult Case Management Discharge Plan :  Will you be returning to the same living situation after discharge:  Yes,  with husband At discharge, do you have transportation home?: Yes,  husband will pick up Do you have the ability to pay for your medications: Yes,  insurance  Release of information consent forms completed and turned in to Medical Records by CSW.   Patient to Follow up at: Follow-up Information    [facility] on 07/25/2019.   Why: Next appointment with Dr. Altamese Wimb[place]on is on Monday 10/26 at 5:30pm VIA TELEPHONE.  Remember to ask him about referrals for therapy. Contact information: [address] [place], [place] [address] Tel: [phone] Fax: [phone]          Next level of care provider has access to [facility] and Suicide Prevention discussed: Yes,  with husband     Has patient been referred to the Quitline?: Patient refused referral  Patient has been referred for addiction treatment: Pt. refused referral  Maretta Los, LCSW 07/24/2019, 9:05 AM

## 2019-07-24 NOTE — Progress Notes (Signed)
[place] NOVEL CORONAVIRUS (COVID-19) DAILY CHECK-OFF SYMPTOMS - answer yes or no to each - every day NO YES  Have you had a fever in the past 24 hours?  . Fever (Temp > 37.80C / 100F) X   Have you had any of these symptoms in the past 24 hours? . New Cough .  Sore Throat  .  Shortness of Breath .  Difficulty Breathing .  Unexplained Body Aches   X   Have you had any one of these symptoms in the past 24 hours not related to allergies?   . Runny Nose .  Nasal Congestion .  Sneezing   X   If you have had runny nose, nasal congestion, sneezing in the past 24 hours, has it worsened?  X   EXPOSURES - check yes or no X   Have you traveled outside the state in the past 14 days?  X   Have you been in contact with someone with a confirmed diagnosis of COVID-19 or PUI in the past 14 days without wearing appropriate PPE?  X   Have you been living in the same home as a person with confirmed diagnosis of COVID-19 or a PUI (household contact)?    X   Have you been diagnosed with COVID-19?    X              What to do next: Answered NO to all: Answered YES to anything:   Proceed with unit schedule Follow the [facility] Inpatient Flowsheet.   
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
## 2019-07-24 NOTE — Progress Notes (Signed)
Pt discharged to lobby- husband present to pick up patient . Pt was stable and appreciative at that time. All papers and prescriptions were given and valuables returned. Verbal understanding expressed. Denies SI/HI and A/VH. Pt given opportunity to express concerns and ask questions.

## 2019-07-24 NOTE — Progress Notes (Signed)
D.  Pt pleasant on approach, no complaints voiced.  Pt was positive for evening wrap up group, observed engaged appropriately with peers on the unit.  Pt denies SI/HI/AVH at this time.  A.  Support and encouragement offered, medication given as ordered  R.  Pt remains safe on the unit, will continue to monitor.

## 2019-07-24 NOTE — Progress Notes (Addendum)
D. Pt friendly ,smiling, upon approach- reports that she is doing much better and is looking forward to going home. Pt states, "I can tell how much I need to stay on my medications". Pt currently denies SI/HI and AVH  A. Labs and vitals monitored. Pt compliant with medications. Pt supported emotionally and encouraged to express concerns and ask questions.   R. Pt remains safe with 15 minute checks. Will continue POC.

## 2019-07-24 NOTE — BHH Suicide Risk Assessment (Signed)
[facility] Discharge Suicide Risk Assessment   Principal Problem: MDD (major depressive disorder) Discharge Diagnoses: Principal Problem:   MDD (major depressive disorder) Active Problems:   Depression   Total Time spent with patient: 20 minutes  Musculoskeletal: Strength & Muscle Tone: within normal limits Gait & Station: normal Patient leans: N/A  Psychiatric Specialty Exam: Review of Systems  All other systems reviewed and are negative.   Blood pressure (!) 157/85, pulse 68, height 5\' 4"  (1.626 m), weight 56.2 kg.Body mass index is 21.28 kg/m.  General Appearance: Casual  Eye Contact::  Good  Speech:  Normal Rate409  Volume:  Normal  Mood:  Euthymic  Affect:  Congruent  Thought Process:  Coherent and Descriptions of Associations: Intact  Orientation:  Full (Time, Place, and Person)  Thought Content:  Logical  Suicidal Thoughts:  No  Homicidal Thoughts:  No  Memory:  Immediate;   Fair Recent;   Fair Remote;   Fair  Judgement:  Intact  Insight:  Good  Psychomotor Activity:  Normal  Concentration:  Fair  Recall:  Good  Fund of Knowledge:Good  Language: Good  Akathisia:  Negative  Handed:  Right  AIMS (if indicated):     Assets:  Desire for Improvement Resilience  Sleep:  Number of Hours: 6  Cognition: WNL  ADL's:  Intact   Mental Status Per Nursing Assessment::   On Admission:  Suicidal ideation indicated by patient, Intention to act on suicide plan, Self-harm thoughts, Suicide plan  Demographic Factors:  Unemployed  Loss Factors: NA  Historical Factors: Impulsivity  Risk Reduction Factors:   Sense of responsibility to family, Living with another person, especially a relative, Positive social support, Positive therapeutic relationship and Positive coping skills or problem solving skills  Continued Clinical Symptoms:  Bipolar Disorder:   Depressive phase Alcohol/Substance Abuse/Dependencies  Cognitive Features That Contribute To Risk:  None     Suicide Risk:  Minimal: No identifiable suicidal ideation.  Patients presenting with no risk factors but with morbid ruminations; may be classified as minimal risk based on the severity of the depressive symptoms  Follow-up Information    [facility] Follow up.   Contact information: [address] [place],  [address] Tel: [phone] Fax: [phone]          Plan Of Care/Follow-up recommendations:  Activity:  ad lib  Sharma Covert, MD 07/24/2019, 8:54 AM

## 2019-07-24 NOTE — BHH Suicide Risk Assessment (Signed)
[place] INPATIENT:  Family/Significant Other Suicide Prevention Education  Suicide Prevention Education:  Education Completed; Husband Luz Brazen [phone],  (name of family member/significant other) has been identified by the patient as the family member/significant other with whom the patient will be residing, and identified as the person(s) who will aid the patient in the event of a mental health crisis (suicidal ideations/suicide attempt).  With written consent from the patient, the family member/significant other has been provided the following suicide prevention education, prior to the and/or following the discharge of the patient.  The suicide prevention education provided includes the following:  Suicide risk factors  Suicide prevention and interventions  National Suicide Hotline telephone number  [facility] assessment telephone number  [facility] Emergency Assistance [place] and/or Residential Mobile Crisis Unit telephone number  Request made of family/significant other to:  Remove weapons (e.g., guns, rifles, knives), all items previously/currently identified as safety concern.    Remove drugs/medications (over-the-counter, prescriptions, illicit drugs), all items previously/currently identified as a safety concern.  The family member/significant other verbalizes understanding of the suicide prevention education information provided.  The family member/significant other agrees to remove the items of safety concern listed above.  Husband had no concerns, was thankful for the information above, and will be coming to pick his wife up later today.  Berlin Hun Grossman-Orr 07/24/2019, 9:04 AM

## 2019-11-19 ENCOUNTER — Ambulatory Visit: Payer: BC Managed Care – PPO | Attending: Internal Medicine

## 2019-11-19 DIAGNOSIS — Z23 Encounter for immunization: Secondary | ICD-10-CM | POA: Insufficient documentation

## 2019-11-19 NOTE — Progress Notes (Signed)
   Covid-19 Vaccination Clinic  Name:  Morgan Davenport    MRN: [id number] DOB: 1961-08-11  11/19/2019  Morgan Davenport was observed post Covid-19 immunization for 15 minutes without incidence. She was provided with Vaccine Information Sheet and instruction to access the V-Safe system.   Morgan Davenport was instructed to call 911 with any severe reactions post vaccine: Marland Kitchen Difficulty breathing  . Swelling of your face and throat  . A fast heartbeat  . A bad rash all over your body  . Dizziness and weakness    Immunizations Administered    Name Date Dose VIS Date Route   Pfizer COVID-19 Vaccine 11/19/2019 10:07 AM 0.3 mL 09/09/2019 Intramuscular   Manufacturer: [place]   Lot: [id number]   [place]: [id number]

## 2019-12-13 ENCOUNTER — Ambulatory Visit: Payer: BC Managed Care – PPO | Attending: Internal Medicine

## 2019-12-13 DIAGNOSIS — Z23 Encounter for immunization: Secondary | ICD-10-CM

## 2019-12-13 NOTE — Progress Notes (Signed)
   Covid-19 Vaccination Clinic  Name:  Morgan Davenport    MRN: [id number] DOB: 20-Jul-1961  12/13/2019  Morgan Davenport was observed post Covid-19 immunization for 15 minutes without incident. She was provided with Vaccine Information Sheet and instruction to access the V-Safe system.   Morgan Davenport was instructed to call 911 with any severe reactions post vaccine: Marland Kitchen Difficulty breathing  . Swelling of face and throat  . A fast heartbeat  . A bad rash all over body  . Dizziness and weakness   Immunizations Administered    Name Date Dose VIS Date Route   Pfizer COVID-19 Vaccine 12/13/2019 10:37 AM 0.3 mL 09/09/2019 Intramuscular   Manufacturer: [place]   Lot: [id number]   [place]: [id number]

## 2019-12-18 ENCOUNTER — Emergency Department (HOSPITAL_COMMUNITY): Payer: BC Managed Care – PPO

## 2019-12-18 ENCOUNTER — Emergency Department (HOSPITAL_COMMUNITY)
Admission: EM | Admit: 2019-12-18 | Discharge: 2019-12-18 | Disposition: A | Discharge status: Home / Self Care | Payer: BC Managed Care – PPO | Attending: Emergency Medicine | Admitting: Emergency Medicine

## 2019-12-18 ENCOUNTER — Other Ambulatory Visit: Payer: Self-pay

## 2019-12-18 ENCOUNTER — Encounter (HOSPITAL_COMMUNITY): Payer: Self-pay

## 2019-12-18 DIAGNOSIS — I10 Essential (primary) hypertension: Secondary | ICD-10-CM | POA: Insufficient documentation

## 2019-12-18 DIAGNOSIS — R55 Syncope and collapse: Secondary | ICD-10-CM | POA: Insufficient documentation

## 2019-12-18 DIAGNOSIS — F1910 Other psychoactive substance abuse, uncomplicated: Secondary | ICD-10-CM | POA: Insufficient documentation

## 2019-12-18 DIAGNOSIS — F17210 Nicotine dependence, cigarettes, uncomplicated: Secondary | ICD-10-CM | POA: Insufficient documentation

## 2019-12-18 DIAGNOSIS — R0602 Shortness of breath: Secondary | ICD-10-CM | POA: Insufficient documentation

## 2019-12-18 DIAGNOSIS — D72829 Elevated white blood cell count, unspecified: Secondary | ICD-10-CM | POA: Insufficient documentation

## 2019-12-18 DIAGNOSIS — E1165 Type 2 diabetes mellitus with hyperglycemia: Secondary | ICD-10-CM | POA: Insufficient documentation

## 2019-12-18 LAB — TROPONIN I (HIGH SENSITIVITY)
Troponin I (High Sensitivity): 6 ng/L (ref <18)
Troponin I (High Sensitivity): 8 ng/L (ref <18)

## 2019-12-18 LAB — CBC WITH DIFFERENTIAL/PLATELET
Abs Immature Granulocytes: 0.07 10*3/uL (ref 0.00–0.07)
Basophils Absolute: 0 10*3/uL (ref 0.0–0.1)
Basophils Relative: 0 %
Eosinophils Absolute: 0 10*3/uL (ref 0.0–0.5)
Eosinophils Relative: 0 %
HCT: 44.4 % (ref 36.0–46.0)
Hemoglobin: 13.9 g/dL (ref 12.0–15.0)
Immature Granulocytes: 1 %
Lymphocytes Relative: 16 %
Lymphs Abs: 2.4 10*3/uL (ref 0.7–4.0)
MCH: 28 pg (ref 26.0–34.0)
MCHC: 31.3 g/dL (ref 30.0–36.0)
MCV: 89.5 fL (ref 80.0–100.0)
Monocytes Absolute: 1 10*3/uL (ref 0.1–1.0)
Monocytes Relative: 7 %
Neutro Abs: 11.3 10*3/uL — ABNORMAL HIGH (ref 1.7–7.7)
Neutrophils Relative %: 76 %
Platelets: 260 10*3/uL (ref 150–400)
RBC: 4.96 MIL/uL (ref 3.87–5.11)
RDW: 14.8 % (ref 11.5–15.5)
WBC: 14.8 10*3/uL — ABNORMAL HIGH (ref 4.0–10.5)
nRBC: 0 % (ref 0.0–0.2)

## 2019-12-18 LAB — HEPATIC FUNCTION PANEL
ALT: 19 U/L (ref 0–44)
AST: 30 U/L (ref 15–41)
Albumin: 4.5 g/dL (ref 3.5–5.0)
Alkaline Phosphatase: 74 U/L (ref 38–126)
Bilirubin, Direct: 0.1 mg/dL (ref 0.0–0.2)
Indirect Bilirubin: 0.7 mg/dL (ref 0.3–0.9)
Total Bilirubin: 0.8 mg/dL (ref 0.3–1.2)
Total Protein: 7.5 g/dL (ref 6.5–8.1)

## 2019-12-18 LAB — URINALYSIS, ROUTINE W REFLEX MICROSCOPIC
Bacteria, UA: NONE SEEN
Bilirubin Urine: NEGATIVE
Glucose, UA: NEGATIVE mg/dL
Hgb urine dipstick: NEGATIVE
Ketones, ur: 20 mg/dL — AB
Nitrite: NEGATIVE
Protein, ur: NEGATIVE mg/dL
Specific Gravity, Urine: 1.018 (ref 1.005–1.030)
pH: 6 (ref 5.0–8.0)

## 2019-12-18 LAB — RAPID URINE DRUG SCREEN, HOSP PERFORMED
Amphetamines: NOT DETECTED
Barbiturates: NOT DETECTED
Benzodiazepines: NOT DETECTED
Cocaine: POSITIVE — AB
Opiates: NOT DETECTED
Tetrahydrocannabinol: POSITIVE — AB

## 2019-12-18 LAB — BASIC METABOLIC PANEL
Anion gap: 22 — ABNORMAL HIGH (ref 5–15)
BUN: 15 mg/dL (ref 6–20)
CO2: 18 mmol/L — ABNORMAL LOW (ref 22–32)
Calcium: 10.4 mg/dL — ABNORMAL HIGH (ref 8.9–10.3)
Chloride: 99 mmol/L (ref 98–111)
Creatinine, Ser: 1.03 mg/dL — ABNORMAL HIGH (ref 0.44–1.00)
GFR calc Af Amer: >60 mL/min (ref >60)
GFR calc non Af Amer: 59 mL/min — ABNORMAL LOW (ref >60)
Glucose, Bld: 170 mg/dL — ABNORMAL HIGH (ref 70–99)
Potassium: 3.6 mmol/L (ref 3.5–5.1)
Sodium: 139 mmol/L (ref 135–145)

## 2019-12-18 LAB — SALICYLATE LEVEL: Salicylate Lvl: <7 mg/dL — ABNORMAL LOW (ref 7.0–30.0)

## 2019-12-18 LAB — ETHANOL: Alcohol, Ethyl (B): <10 mg/dL (ref <10)

## 2019-12-18 LAB — I-STAT BETA HCG BLOOD, ED (MC, WL, AP ONLY): I-stat hCG, quantitative: <5 m[IU]/mL (ref <5)

## 2019-12-18 LAB — ACETAMINOPHEN LEVEL: Acetaminophen (Tylenol), Serum: <10 ug/mL — ABNORMAL LOW (ref 10–30)

## 2019-12-18 LAB — LIPASE, BLOOD: Lipase: 29 U/L (ref 11–51)

## 2019-12-18 MED ORDER — SODIUM CHLORIDE 0.9 % IV BOLUS
1000.0000 mL | Freq: Once | INTRAVENOUS | Status: AC
  Administered 2019-12-18: 20:00:00 1000 mL via INTRAVENOUS

## 2019-12-18 MED ORDER — SODIUM CHLORIDE 0.9 % IV BOLUS
1000.0000 mL | Freq: Once | INTRAVENOUS | Status: AC
  Administered 2019-12-18: 1000 mL via INTRAVENOUS

## 2019-12-18 NOTE — Discharge Instructions (Addendum)
You have been diagnosed today with syncope, polysubstance abuse.  At this time there does not appear to be the presence of an emergent medical condition, however there is always the potential for conditions to change. Please read and follow the below instructions.  Please return to the Emergency Department immediately for any new or worsening symptoms. Please be sure to follow up with your Primary Care Provider within one week regarding your visit today; please call their office to schedule an appointment even if you are feeling better for a follow-up visit. Please use the resource guide given to you to find local resources to help with your substance abuse. Your lab work today showed evidence of dehydration please drink plenty of water and get plenty of rest when you get home. Please avoid further drug use.  Get help right away if: You have a very bad headache. You pass out once or more than once. You have pain in your chest, belly, or back. You have a very fast or uneven heartbeat (palpitations). It hurts to breathe. You are bleeding from your mouth or your bottom (rectum). You have black or tarry poop (stool). You have jerky movements that you cannot control (seizure). You are confused. You have trouble walking. You are very weak. You have vision problems. These symptoms may be an emergency. Do not wait to see if the symptoms will go away. Get medical help right away. Call your local emergency services (911 in the [place] Do not drive yourself to the hospital.  Please read the additional information packets attached to your discharge summary.  Do not take your medicine if  develop an itchy rash, swelling in your mouth or lips, or difficulty breathing; call 911 and seek immediate emergency medical attention if this occurs.  Note: Portions of this text may have been transcribed using voice recognition software. Every effort was made to ensure accuracy; however, inadvertent computerized  transcription errors may still be present.

## 2019-12-18 NOTE — ED Notes (Signed)
Bare hugger applied for low temp.

## 2019-12-18 NOTE — ED Triage Notes (Signed)
Pt arrived via [facility] from home c/o syncopal episode. Pt's husband found her down in the yard and stated she had been drinking all night and possibly doing drugs. Upon EMS arrival pt was pale and diaphoretic. Pt vomited several times, EMS stated it smelled like straight alcohol. Pt is alert and oriented and thrashing around in the bed. Pt denies any pain.

## 2019-12-18 NOTE — ED Notes (Signed)
The pt has spit in the floor on both sides of the stretcher  She reports that  It occuried whenever she arrived in the room  Pads pl;aced to soak it up blur bag at her side

## 2019-12-18 NOTE — ED Provider Notes (Addendum)
[facility] EMERGENCY DEPARTMENT Provider Note   CSN: [id number] Arrival date & time: 12/18/19  1413     History No chief complaint on file.   Morgan Davenport is a 59 y.o. female history hypertension, depression, diabetes not on insulin, carotid artery occlusion, hysterectomy.  Patient presents today following a syncopal episode brought in by EMS.  Per triage RN patient's husband found her laying on the ground in neighbors yard after drinking and using drugs today.  Upon EMS arrival patient pale and diaphoretic vomiting several times what is thought to be alcohol.  Patient alert and oriented on ED arrival but agitated. - On initial evaluation patient appears intoxicated, she is alert to self, place and time, she reports that she knows what happened today but repeatedly says "I will tell you about it later".  She would not go into detail regarding events that brought her into the ER.  She reports that she is feeling anxious and short of breath but denies any pain.  She denies any recent illness, reports that she drank 1 beer today and smoked "weed".  Denies any other drug use.  Level 5 caveat intoxication.  HPI     Past Medical History:  Diagnosis Date  . Carotid artery occlusion   . Depression   . Diabetes mellitus   . Headache   . Hypertension     Patient Active Problem List   Diagnosis Date Noted  . Depression 07/23/2019  . MDD (major depressive disorder) 07/21/2019  . Chronic migraine 10/02/2015  . Occlusion and stenosis of carotid artery without mention of cerebral infarction 03/28/2013    Past Surgical History:  Procedure Laterality Date  . ABDOMINAL HYSTERECTOMY  Dec. 24, 2008     OB History   No obstetric history on file.     Family History  Adopted: Yes  Problem Relation Age of Onset  . Diabetes Mother   . Hypertension Mother   . Hyperlipidemia Mother   . Heart disease Mother        NOT before age 86  . Heart attack Mother   .  Stomach cancer Father     Social History   Tobacco Use  . Smoking status: Light Tobacco Smoker    Packs/day: 0.25    Years: 10.00    Pack years: 2.50    Types: Cigarettes  . Smokeless tobacco: Never Used  Substance Use Topics  . Alcohol use: Yes    Alcohol/week: 2.0 standard drinks    Types: 2 Cans of beer per week    Comment: every other day  . Drug use: Yes    Types: "Crack" cocaine, Marijuana    Home Medications Prior to Admission medications   Medication Sig Start Date End Date Taking? Authorizing Provider  diclofenac (VOLTAREN) 75 MG EC tablet Take 1 tablet (75 mg total) by mouth 2 (two) times daily. Patient not taking: Reported on 04/20/2017 12/28/16   Barnet Glasgow, NP  glipiZIDE (GLUCOTROL) 5 MG tablet Take 5 mg by mouth 2 (two) times daily.     [provider]  hydrochlorothiazide (HYDRODIURIL) 25 MG tablet Take 25 mg by mouth daily.    [provider]  hydrOXYzine (ATARAX/VISTARIL) 25 MG tablet Take 1 tablet (25 mg total) by mouth 3 (three) times daily as needed for anxiety. 07/24/19   Lindell Spar I, NP  losartan (COZAAR) 50 MG tablet Take 50 mg by mouth daily.    [provider]  mirtazapine (REMERON) 15 MG tablet  Take 1 tablet (15 mg total) by mouth at bedtime. For depression/sleep 07/24/19   Lindell Spar I, NP  Multiple Vitamin (MULITIVITAMIN WITH MINERALS) TABS Take 1 tablet by mouth daily.    [provider]  sertraline (ZOLOFT) 50 MG tablet Take 3 tablets (150 mg total) by mouth daily. For depression 07/25/19   Lindell Spar I, NP  simvastatin (ZOCOR) 10 MG tablet Take 10 mg by mouth at bedtime.     [provider]  traZODone (DESYREL) 50 MG tablet Take 1 tablet (50 mg total) by mouth at bedtime as needed for sleep. 07/24/19   Lindell Spar I, NP    Allergies    Tramadol  Review of Systems   Review of Systems  Unable to perform ROS: Other    Physical Exam Updated Vital Signs BP 129/70   Pulse 77   Temp 97.7  F (36.5 C) (Oral)   Resp 14   SpO2 97%   Physical Exam Constitutional:      General: She is not in acute distress.    Appearance: Normal appearance. She is well-developed. She is not ill-appearing or diaphoretic.  HENT:     Head: Normocephalic and atraumatic.     Right Ear: External ear normal.     Left Ear: External ear normal.     Nose: Nose normal.     Mouth/Throat:     Mouth: Mucous membranes are moist.     Pharynx: Oropharynx is clear.  Eyes:     General: Vision grossly intact. Gaze aligned appropriately.     Pupils: Pupils are equal, round, and reactive to light.  Neck:     Trachea: Trachea and phonation normal. No tracheal deviation.  Cardiovascular:     Rate and Rhythm: Normal rate and regular rhythm.     Pulses: Normal pulses.     Heart sounds: Normal heart sounds.  Pulmonary:     Effort: Pulmonary effort is normal. No respiratory distress.     Breath sounds: Normal breath sounds.  Abdominal:     General: There is no distension.     Palpations: Abdomen is soft.     Tenderness: There is no abdominal tenderness. There is no guarding or rebound.  Musculoskeletal:        General: Normal range of motion.     Cervical back: Normal range of motion.  Skin:    General: Skin is warm and dry.  Neurological:     Mental Status: She is alert.     GCS: GCS eye subscore is 4. GCS verbal subscore is 5. GCS motor subscore is 6.     Comments: Speech is clear and goal oriented, follows commands Major Cranial nerves without deficit, no facial droop Moves extremities without ataxia, coordination intact  Psychiatric:        Behavior: Behavior normal.     ED Results / Procedures / Treatments   Labs (all labs ordered are listed, but only abnormal results are displayed) Labs Reviewed  CBC WITH DIFFERENTIAL/PLATELET - Abnormal; Notable for the following components:      Result Value   WBC 14.8 (*)    Neutro Abs 11.3 (*)    All other components within normal limits  BASIC  METABOLIC PANEL - Abnormal; Notable for the following components:   CO2 18 (*)    Glucose, Bld 170 (*)    Creatinine, Ser 1.03 (*)    Calcium 10.4 (*)    GFR calc non Af Amer 59 (*)  Anion gap 22 (*)    All other components within normal limits  ACETAMINOPHEN LEVEL - Abnormal; Notable for the following components:   Acetaminophen (Tylenol), Serum <10 (*)    All other components within normal limits  SALICYLATE LEVEL - Abnormal; Notable for the following components:   Salicylate Lvl [id number] (*)    All other components within normal limits  URINALYSIS, ROUTINE W REFLEX MICROSCOPIC - Abnormal; Notable for the following components:   Ketones, ur 20 (*)    Leukocytes,Ua SMALL (*)    All other components within normal limits  RAPID URINE DRUG SCREEN, HOSP PERFORMED - Abnormal; Notable for the following components:   Cocaine POSITIVE (*)    Tetrahydrocannabinol POSITIVE (*)    All other components within normal limits  LIPASE, BLOOD  HEPATIC FUNCTION PANEL  ETHANOL  I-STAT BETA HCG BLOOD, ED ([facility], [facility], [facility] ONLY)  TROPONIN I (HIGH SENSITIVITY)  TROPONIN I (HIGH SENSITIVITY)    EKG EKG Interpretation  Date/Time:  Sunday December 18 2019 20:17:50 EDT Ventricular Rate:  79 PR Interval:    QRS Duration: 85 QT Interval:  403 QTC Calculation: 462 R Axis:   94 Text Interpretation: Sinus rhythm Probable left atrial enlargement Borderline right axis deviation Probable anteroseptal infarct, old Confirmed by Dene Gentry [phone]) on 12/18/2019 8:19:20 PM   Radiology CT Head Wo Contrast  Result Date: 12/18/2019 CLINICAL DATA:  Syncopal episode EXAM: CT HEAD WITHOUT CONTRAST TECHNIQUE: Contiguous axial images were obtained from the base of the skull through the vertex without intravenous contrast. COMPARISON:  2014 FINDINGS: Brain: There is no acute intracranial hemorrhage, mass effect, or edema. Gray-white differentiation is preserved. There is no extra-axial fluid collection. Ventricles and sulci  are within normal limits in size and configuration. Stable focus of calcification near the foramen of Monro, which is likely vascular. Vascular: No hyperdense vessel. Skull: Calvarium is unremarkable. Sinuses/Orbits: No acute finding. Other: None. IMPRESSION: No acute intracranial hemorrhage, mass effect, or evidence of acute infarction. Electronically Signed   By: Macy Mis M.D.   On: 12/18/2019 15:41   CT Cervical Spine Wo Contrast  Result Date: 12/18/2019 CLINICAL DATA:  Altered mental status EXAM: CT CERVICAL SPINE WITHOUT CONTRAST TECHNIQUE: Multidetector CT imaging of the cervical spine was performed without intravenous contrast. Multiplanar CT image reconstructions were also generated. COMPARISON:  None. FINDINGS: Alignment: No significant listhesis. Skull base and vertebrae: No acute cervical spine fracture. Vertebral body heights are maintained. Soft tissues and spinal canal: No prevertebral fluid or swelling. No visible canal hematoma. Disc levels: There is disc space narrowing, degenerative endplate irregularity disc bulge, and endplate osteophytes at C5-C6 without high-grade stenosis. Upper chest: Emphysema. Other: None IMPRESSION: No acute cervical spine fracture. Emphysema. Electronically Signed   By: Macy Mis M.D.   On: 12/18/2019 15:44   DG Chest Portable 1 View  Result Date: 12/18/2019 CLINICAL DATA:  Shortness of breath. EXAM: PORTABLE CHEST 1 VIEW COMPARISON:  October 04, 2015. FINDINGS: The heart size and mediastinal contours are within normal limits. Both lungs are clear. No pneumothorax or pleural effusion is noted. The visualized skeletal structures are unremarkable. IMPRESSION: No active disease. Electronically Signed   By: Marijo Conception M.D.   On: 12/18/2019 15:08    Procedures Procedures (including critical care time)  Medications Ordered in ED Medications  sodium chloride 0.9 % bolus 1,000 mL (0 mLs Intravenous Stopped 12/18/19 1958)  sodium chloride 0.9 % bolus  1,000 mL (0 mLs Intravenous Stopped 12/18/19 2104)  ED Course  I have reviewed the triage vital signs and the nursing notes.  Pertinent labs & imaging results that were available during my care of the patient were reviewed by me and considered in my medical decision making (see chart for details).  Clinical Course as of Dec 17 2104  Nancy Fetter Dec 18, 2019  1614 [id number]: Leane Para)   [BM]    Clinical Course User Index [BM] Gari Crown   MDM Rules/Calculators/A&P                     59 year old female with history as detailed above presents today following a syncopal episode.  She is alert to self place and time, she reports that she remembers what happened today but will not say what that is.  She is following commands appropriately, no cranial nerve deficit, strength intact and equal bilaterally, heart regular rate and rhythm, lungs clear, abdomen soft nontender without peritoneal signs.  She reports drinking 1 beer and smoking marijuana today denies any other drug use.  She denies any injury or any pain but does report she is feeling somewhat short of breath.  Broad work-up initiated with medical screening labs in addition to a troponin.  EKG, chest x-ray and CT head has been ordered.  Patient noted to be mildly hypothermic placed on Bair hugger. - 4:14 PM: I had a discussion with patient's husband Leane Para over the phone, he reports that the patient has been "down at the drug house" for the past several days.  He was contacted by a neighbor that found the patient laying in a driveway.  He reports that he does not know what drugs that she may have taken but thinks that it was probably multiple different substances. - Patient was reassessed multiple times each time she is less intoxicated appearing.  Lab work results reviewed below.  CBC shows leukocytosis of 14.8 with left shift, suspect this to be secondary to patient's drug use today, low suspicion for infection requiring  antibiotics at this time.  Tylenol and salicylate levels negative no evidence of ingestion of substances.  Lipase within normal limits no evidence of pancreatitis.  LFTs within normal limits no evidence of hepatic injury.  Pregnancy test negative.  Urinalysis shows small leukocytes and 20 ketones suspect secondary to dehydration no evidence of infection.  For cocaine and THC.  High-sensitivity troponin within normal limits x2.    BMP shows anion gap 22, creatinine 1.03 bicarb 18, glucose 170.  Doubt this to be DKA, suspect abnormality secondary to drug and alcohol use, given IV fluids for correction. No indication for admission, discussed with Dr. Francia Greaves. - CT head:  IMPRESSION:  No acute intracranial hemorrhage, mass effect, or evidence of acute  infarction.   CT Cspine:  IMPRESSION:  No acute cervical spine fracture.    Emphysema.   CXR:  IMPRESSION:  No active disease.   EKG: Sinus rhythm Probable left atrial enlargement Borderline right axis deviation Probable anteroseptal infarct, old Confirmed by Dene Gentry [phone]) on 12/18/2019 8:19:20 PM - Patient reevaluated multiple times, she is now fully alert and oriented walking around the emergency department without assistance or difficulty requesting food prior to being discharged. She is well appearing and in no acute distress. She is requesting to go home.  She reports that she "swears off of drugs", she reports that she was sober for 8 years prior to recent relapse.  She will be given outpatient resources.  As above suspect patient's lab  abnormalities are secondary to drug use, doubt DKA/hyperglycemic emergency at this time vital signs stable she is tolerating p.o. without difficulty fully alert and oriented.  There is no indication for admission or further work-up at this time, fluids repleted in ER, encouraged continued water intake at home.  Suspect patient's syncopal episode today secondary to drug use, she has been monitored in  the ER for greater than 6 and half hours at this point without recurrence.  At this time there does not appear to be any evidence of an acute emergency medical condition and the patient appears stable for discharge with appropriate outpatient follow up. Diagnosis was discussed with patient who verbalizes understanding of care plan and is agreeable to discharge. I have discussed return precautions with patient who verbalizes understanding of return precautions. Patient encouraged to follow-up with their PCP. All questions answered.  Patient seen and evaluated by Dr. Francia Greaves during this visit who agrees with discharge.  Note: Portions of this report may have been transcribed using voice recognition software. Every effort was made to ensure accuracy; however, inadvertent computerized transcription errors may still be present. Final Clinical Impression(s) / ED Diagnoses Final diagnoses:  Polysubstance abuse ([place])  Syncope, unspecified syncope type    Rx / DC Orders ED Discharge Orders    None       Gari Crown 12/18/19 2112    Valarie Merino, MD 12/22/19 2328

## 2019-12-18 NOTE — ED Notes (Signed)
She just returned from c-t

## 2019-12-18 NOTE — ED Notes (Signed)
Pt up to the br  She reports that she took cocaine alcohol and pot earlier today

## 2019-12-18 NOTE — ED Notes (Signed)
The pts husband was here earlier but he had to leave

## 2019-12-29 ENCOUNTER — Other Ambulatory Visit: Payer: Self-pay

## 2019-12-29 ENCOUNTER — Emergency Department (HOSPITAL_COMMUNITY)
Admission: EM | Admit: 2019-12-29 | Discharge: 2019-12-29 | Disposition: A | Discharge status: Home / Self Care | Payer: BC Managed Care – PPO | Attending: Emergency Medicine | Admitting: Emergency Medicine

## 2019-12-29 ENCOUNTER — Encounter (HOSPITAL_COMMUNITY): Payer: Self-pay | Admitting: Emergency Medicine

## 2019-12-29 DIAGNOSIS — Z720 Tobacco use: Secondary | ICD-10-CM | POA: Insufficient documentation

## 2019-12-29 DIAGNOSIS — Z79899 Other long term (current) drug therapy: Secondary | ICD-10-CM | POA: Insufficient documentation

## 2019-12-29 DIAGNOSIS — Z7984 Long term (current) use of oral hypoglycemic drugs: Secondary | ICD-10-CM | POA: Insufficient documentation

## 2019-12-29 DIAGNOSIS — I10 Essential (primary) hypertension: Secondary | ICD-10-CM | POA: Insufficient documentation

## 2019-12-29 DIAGNOSIS — E119 Type 2 diabetes mellitus without complications: Secondary | ICD-10-CM | POA: Insufficient documentation

## 2019-12-29 DIAGNOSIS — F419 Anxiety disorder, unspecified: Secondary | ICD-10-CM | POA: Insufficient documentation

## 2019-12-29 DIAGNOSIS — F1910 Other psychoactive substance abuse, uncomplicated: Secondary | ICD-10-CM | POA: Insufficient documentation

## 2019-12-29 LAB — URINALYSIS, ROUTINE W REFLEX MICROSCOPIC
Bilirubin Urine: NEGATIVE
Glucose, UA: NEGATIVE mg/dL
Hgb urine dipstick: NEGATIVE
Ketones, ur: 5 mg/dL — AB
Nitrite: NEGATIVE
Protein, ur: NEGATIVE mg/dL
Specific Gravity, Urine: 1.005 (ref 1.005–1.030)
pH: 7 (ref 5.0–8.0)

## 2019-12-29 LAB — BASIC METABOLIC PANEL
Anion gap: 17 — ABNORMAL HIGH (ref 5–15)
BUN: 10 mg/dL (ref 6–20)
CO2: 20 mmol/L — ABNORMAL LOW (ref 22–32)
Calcium: 9.8 mg/dL (ref 8.9–10.3)
Chloride: 103 mmol/L (ref 98–111)
Creatinine, Ser: 0.77 mg/dL (ref 0.44–1.00)
GFR calc Af Amer: >60 mL/min (ref >60)
GFR calc non Af Amer: >60 mL/min (ref >60)
Glucose, Bld: 110 mg/dL — ABNORMAL HIGH (ref 70–99)
Potassium: 3.5 mmol/L (ref 3.5–5.1)
Sodium: 140 mmol/L (ref 135–145)

## 2019-12-29 LAB — CBG MONITORING, ED: Glucose-Capillary: 112 mg/dL — ABNORMAL HIGH (ref 70–99)

## 2019-12-29 MED ORDER — SODIUM CHLORIDE 0.9% FLUSH: 3.0000 mL | Freq: Once | INTRAVENOUS | Status: DC

## 2019-12-29 MED ORDER — LORAZEPAM 2 MG/ML IJ SOLN
1.0000 mg | Freq: Once | INTRAMUSCULAR | Status: AC
  Administered 2019-12-29: 1 mg via INTRAVENOUS
  Filled 2019-12-29: qty 1

## 2019-12-29 NOTE — ED Triage Notes (Signed)
Pt arrives by EMS from home- states she has been very anxious lately. Pt feels like she may pass out, but has not passed out. Pt told EMS this happened last week, and she passed out. Pt has been drinking today, took cocaine and used marijuana today. EKG shows SR from EMS. BP 162/92, HR 80, resp 22, CBG 126 temp 98.1

## 2020-01-02 NOTE — ED Provider Notes (Signed)
[facility] EMERGENCY DEPARTMENT Provider Note   CSN: [id number] Arrival date & time: 12/29/19  1801     History Chief Complaint  Patient presents with  . Anxiety    Morgan Davenport is a 59 y.o. female.  HPI   59 year old female with anxiety.  She states that this intermittent issue for her.  Today she used marijuana, drink alcohol and also did crack.  The last time she had these symptoms was in the setting of substance abuse as well.  She feels extremely anxious.  She is not sure what to do.  Crying at times.  Denies any acute pain but has a vague sensation that she may pass out.  Past Medical History:  Diagnosis Date  . Carotid artery occlusion   . Depression   . Diabetes mellitus   . Headache   . Hypertension     Patient Active Problem List   Diagnosis Date Noted  . Depression 07/23/2019  . MDD (major depressive disorder) 07/21/2019  . Chronic migraine 10/02/2015  . Occlusion and stenosis of carotid artery without mention of cerebral infarction 03/28/2013    Past Surgical History:  Procedure Laterality Date  . ABDOMINAL HYSTERECTOMY  Dec. 24, 2008     OB History   No obstetric history on file.     Family History  Adopted: Yes  Problem Relation Age of Onset  . Diabetes Mother   . Hypertension Mother   . Hyperlipidemia Mother   . Heart disease Mother        NOT before age 41  . Heart attack Mother   . Stomach cancer Father     Social History   Tobacco Use  . Smoking status: Light Tobacco Smoker    Packs/day: 0.25    Years: 10.00    Pack years: 2.50    Types: Cigarettes  . Smokeless tobacco: Never Used  Substance Use Topics  . Alcohol use: Yes    Alcohol/week: 2.0 standard drinks    Types: 2 Cans of beer per week    Comment: every other day  . Drug use: Yes    Types: "Crack" cocaine, Marijuana, Cocaine    Home Medications Prior to Admission medications   Medication Sig Start Date End Date Taking? Authorizing Provider    clonazePAM (KLONOPIN) 0.5 MG tablet Take 0.5 mg by mouth 2 (two) times daily as needed for anxiety.   Yes [provider]  glipiZIDE (GLUCOTROL) 5 MG tablet Take 5 mg by mouth 2 (two) times daily.    Yes [provider]  hydrochlorothiazide (HYDRODIURIL) 25 MG tablet Take 25 mg by mouth daily.   Yes [provider]  losartan (COZAAR) 50 MG tablet Take 50 mg by mouth daily.   Yes [provider]  mirtazapine (REMERON) 15 MG tablet Take 1 tablet (15 mg total) by mouth at bedtime. For depression/sleep 07/24/19  Yes Lindell Spar I, NP  Multiple Vitamin (MULITIVITAMIN WITH MINERALS) TABS Take 1 tablet by mouth daily.   Yes [provider]  sertraline (ZOLOFT) 50 MG tablet Take 3 tablets (150 mg total) by mouth daily. For depression 07/25/19  Yes Nwoko, Herbert Pun I, NP  simvastatin (ZOCOR) 10 MG tablet Take 10 mg by mouth at bedtime.    Yes [provider]  diclofenac (VOLTAREN) 75 MG EC tablet Take 1 tablet (75 mg total) by mouth 2 (two) times daily. Patient not taking: Reported on 04/20/2017 12/28/16   Barnet Glasgow, NP  hydrOXYzine (ATARAX/VISTARIL) 25 MG  tablet Take 1 tablet (25 mg total) by mouth 3 (three) times daily as needed for anxiety. Patient not taking: Reported on 12/29/2019 07/24/19   Lindell Spar I, NP  traZODone (DESYREL) 50 MG tablet Take 1 tablet (50 mg total) by mouth at bedtime as needed for sleep. Patient not taking: Reported on 12/29/2019 07/24/19   Lindell Spar I, NP    Allergies    Tramadol  Review of Systems   Review of Systems All systems reviewed and negative, other than as noted in HPI.  Physical Exam Updated Vital Signs BP 131/84   Pulse 74   Temp (!) 97.5 F (36.4 C) (Oral)   Resp (!) 24   SpO2 100%   Physical Exam Vitals and nursing note reviewed.  Constitutional:      General: She is not in acute distress.    Appearance: She is well-developed.  HENT:     Head: Normocephalic and atraumatic.  Eyes:      General:        Right eye: No discharge.        Left eye: No discharge.     Conjunctiva/sclera: Conjunctivae normal.  Cardiovascular:     Rate and Rhythm: Normal rate and regular rhythm.     Heart sounds: Normal heart sounds. No murmur. No friction rub. No gallop.   Pulmonary:     Effort: Pulmonary effort is normal. No respiratory distress.     Breath sounds: Normal breath sounds.  Abdominal:     General: There is no distension.     Palpations: Abdomen is soft.     Tenderness: There is no abdominal tenderness.  Musculoskeletal:        General: No tenderness.     Cervical back: Neck supple.  Skin:    General: Skin is warm and dry.  Neurological:     Mental Status: She is alert.  Psychiatric:        Thought Content: Thought content normal.     Comments: Anxious     ED Results / Procedures / Treatments   Labs (all labs ordered are listed, but only abnormal results are displayed) Labs Reviewed  BASIC METABOLIC PANEL - Abnormal; Notable for the following components:      Result Value   CO2 20 (*)    Glucose, Bld 110 (*)    Anion gap 17 (*)    All other components within normal limits  URINALYSIS, ROUTINE W REFLEX MICROSCOPIC - Abnormal; Notable for the following components:   Ketones, ur 5 (*)    Leukocytes,Ua MODERATE (*)    Bacteria, UA RARE (*)    All other components within normal limits  CBG MONITORING, ED - Abnormal; Notable for the following components:   Glucose-Capillary 112 (*)    All other components within normal limits    EKG EKG Interpretation  Date/Time:  Thursday December 29 2019 18:11:08 EDT Ventricular Rate:  73 PR Interval:  164 QRS Duration: 70 QT Interval:  402 QTC Calculation: 442 R Axis:   85 Text Interpretation: Normal sinus rhythm Right atrial enlargement Septal infarct , age undetermined Abnormal ECG Confirmed by Virgel Manifold [phone]) on 12/29/2019 6:34:24 PM   Radiology No results found.  Procedures Procedures (including critical care  time)  Medications Ordered in ED Medications  LORazepam (ATIVAN) injection 1 mg (1 mg Intravenous Given 12/29/19 1930)    ED Course  I have reviewed the triage vital signs and the nursing notes.  Pertinent labs & imaging results that were  available during my care of the patient were reviewed by me and considered in my medical decision making (see chart for details).    MDM Rules/Calculators/A&P                      59 year old female with anxiety.  Likely substance induced.  I doubt emergent condition.  ED work-up fairly unremarkable.  Arrived hypertensive but this resolved.  Try to reassure her.  Encouraged to seek help with regards to her substance abuse.  Return precautions discussed.  Outpatient follow-up otherwise. Final Clinical Impression(s) / ED Diagnoses Final diagnoses:  Anxiety  Substance abuse [facility])    Rx / DC Orders ED Discharge Orders    None       Virgel Manifold, MD 01/02/20 [phone]

## 2020-06-25 ENCOUNTER — Other Ambulatory Visit: Payer: Self-pay

## 2020-06-25 ENCOUNTER — Emergency Department (HOSPITAL_COMMUNITY)
Admission: EM | Admit: 2020-06-25 | Discharge: 2020-06-25 | Disposition: A | Discharge status: Home / Self Care | Payer: Self-pay | Attending: Emergency Medicine | Admitting: Emergency Medicine

## 2020-06-25 ENCOUNTER — Encounter (HOSPITAL_COMMUNITY): Payer: Self-pay

## 2020-06-25 DIAGNOSIS — E119 Type 2 diabetes mellitus without complications: Secondary | ICD-10-CM | POA: Insufficient documentation

## 2020-06-25 DIAGNOSIS — J029 Acute pharyngitis, unspecified: Secondary | ICD-10-CM | POA: Insufficient documentation

## 2020-06-25 DIAGNOSIS — Z79899 Other long term (current) drug therapy: Secondary | ICD-10-CM | POA: Insufficient documentation

## 2020-06-25 DIAGNOSIS — I10 Essential (primary) hypertension: Secondary | ICD-10-CM | POA: Insufficient documentation

## 2020-06-25 DIAGNOSIS — F17210 Nicotine dependence, cigarettes, uncomplicated: Secondary | ICD-10-CM | POA: Insufficient documentation

## 2020-06-25 DIAGNOSIS — Z7984 Long term (current) use of oral hypoglycemic drugs: Secondary | ICD-10-CM | POA: Insufficient documentation

## 2020-06-25 MED ORDER — HYDROCODONE-ACETAMINOPHEN 5-325 MG PO TABS: 1.0000 | ORAL_TABLET | Freq: Four times a day (QID) | ORAL | 0 refills | Status: DC | PRN

## 2020-06-25 MED ORDER — AMOXICILLIN-POT CLAVULANATE 875-125 MG PO TABS: 1.0000 | ORAL_TABLET | Freq: Two times a day (BID) | ORAL | 0 refills | Status: AC

## 2020-06-25 MED ORDER — DEXAMETHASONE 4 MG PO TABS
6.0000 mg | ORAL_TABLET | Freq: Once | ORAL | Status: AC
  Administered 2020-06-25: 6 mg via ORAL
  Filled 2020-06-25: qty 1

## 2020-06-25 MED ORDER — AMOXICILLIN-POT CLAVULANATE 875-125 MG PO TABS
1.0000 | ORAL_TABLET | Freq: Once | ORAL | Status: AC
  Administered 2020-06-25: 1 via ORAL
  Filled 2020-06-25: qty 1

## 2020-06-25 NOTE — Discharge Instructions (Signed)
You were seen in the emergency department for worsening sore throat.  We are putting you on antibiotics and some pain medicine.  Please use ibuprofen 3 times a day and continue warm salt water gargles.  Follow-up with your doctor.  Return to the emergency department if any worsening or concerning symptoms.

## 2020-06-25 NOTE — ED Notes (Signed)
Pt verbalized dc instructions and follow up care. Alert and ambulatory without assistance. Gait steady. No iv.

## 2020-06-25 NOTE — ED Triage Notes (Signed)
Pt arrived via walk in, c/o sore throat since Friday night, worsening since, states hurts worse to swallow. Denies any other sx.

## 2020-06-25 NOTE — ED Provider Notes (Signed)
[facility] DEPT Provider Note   CSN: [id number] Arrival date & time: 06/25/20  1214     History Chief Complaint  Patient presents with  . Sore Throat    Morgan Davenport is a 59 y.o. female.  She has history of diabetes hypertension.  Complaining of 4 days of sore throat getting progressively worse.  Affecting her swallowing and eating.  No known fevers.  No cough.  She is a smoker and stopped smoking a few days ago because it hurt.  No sick contacts.  Has had her Covid vaccinations.  The history is provided by the patient.  Sore Throat This is a new problem. The current episode started more than 2 days ago. The problem occurs constantly. The problem has been gradually worsening. Pertinent negatives include no chest pain, no abdominal pain, no headaches and no shortness of breath. The symptoms are aggravated by swallowing. Nothing relieves the symptoms. She has tried water for the symptoms. The treatment provided no relief.       Past Medical History:  Diagnosis Date  . Carotid artery occlusion   . Depression   . Diabetes mellitus   . Headache   . Hypertension     Patient Active Problem List   Diagnosis Date Noted  . Depression 07/23/2019  . MDD (major depressive disorder) 07/21/2019  . Chronic migraine 10/02/2015  . Occlusion and stenosis of carotid artery without mention of cerebral infarction 03/28/2013    Past Surgical History:  Procedure Laterality Date  . ABDOMINAL HYSTERECTOMY  Dec. 24, 2008     OB History   No obstetric history on file.     Family History  Adopted: Yes  Problem Relation Age of Onset  . Diabetes Mother   . Hypertension Mother   . Hyperlipidemia Mother   . Heart disease Mother        NOT before age 43  . Heart attack Mother   . Stomach cancer Father     Social History   Tobacco Use  . Smoking status: Light Tobacco Smoker    Packs/day: 0.25    Years: 10.00    Pack years: 2.50    Types:  Cigarettes  . Smokeless tobacco: Never Used  Vaping Use  . Vaping Use: Never used  Substance Use Topics  . Alcohol use: Yes    Alcohol/week: 2.0 standard drinks    Types: 2 Cans of beer per week    Comment: every other day  . Drug use: Yes    Types: "Crack" cocaine, Marijuana, Cocaine    Home Medications Prior to Admission medications   Medication Sig Start Date End Date Taking? Authorizing Provider  clonazePAM (KLONOPIN) 0.5 MG tablet Take 0.5 mg by mouth 2 (two) times daily as needed for anxiety.    [provider]  diclofenac (VOLTAREN) 75 MG EC tablet Take 1 tablet (75 mg total) by mouth 2 (two) times daily. Patient not taking: Reported on 04/20/2017 12/28/16   Barnet Glasgow, NP  glipiZIDE (GLUCOTROL) 5 MG tablet Take 5 mg by mouth 2 (two) times daily.     [provider]  hydrochlorothiazide (HYDRODIURIL) 25 MG tablet Take 25 mg by mouth daily.    [provider]  hydrOXYzine (ATARAX/VISTARIL) 25 MG tablet Take 1 tablet (25 mg total) by mouth 3 (three) times daily as needed for anxiety. Patient not taking: Reported on 12/29/2019 07/24/19   Lindell Spar I, NP  losartan (COZAAR) 50 MG tablet Take 50 mg by mouth  daily.    [provider]  mirtazapine (REMERON) 15 MG tablet Take 1 tablet (15 mg total) by mouth at bedtime. For depression/sleep 07/24/19   Lindell Spar I, NP  Multiple Vitamin (MULITIVITAMIN WITH MINERALS) TABS Take 1 tablet by mouth daily.    [provider]  sertraline (ZOLOFT) 50 MG tablet Take 3 tablets (150 mg total) by mouth daily. For depression 07/25/19   Lindell Spar I, NP  simvastatin (ZOCOR) 10 MG tablet Take 10 mg by mouth at bedtime.     [provider]  traZODone (DESYREL) 50 MG tablet Take 1 tablet (50 mg total) by mouth at bedtime as needed for sleep. Patient not taking: Reported on 12/29/2019 07/24/19   Lindell Spar I, NP    Allergies    Tramadol  Review of Systems   Review of Systems   Constitutional: Negative for fever.  HENT: Positive for sore throat and trouble swallowing.   Eyes: Negative for visual disturbance.  Respiratory: Negative for shortness of breath.   Cardiovascular: Negative for chest pain.  Gastrointestinal: Negative for abdominal pain.  Genitourinary: Negative for dysuria.  Musculoskeletal: Negative for neck pain.  Skin: Negative for rash.  Neurological: Negative for headaches.    Physical Exam Updated Vital Signs BP (!) 146/61 (BP Location: Left Arm)   Pulse 63   Temp 98.1 F (36.7 C) (Oral)   Resp 16   Ht 5\' 4"  (1.626 m)   Wt 47.4 kg   SpO2 99%   BMI 17.94 kg/m   Physical Exam Vitals and nursing note reviewed.  Constitutional:      General: She is not in acute distress.    Appearance: She is well-developed.  HENT:     Head: Normocephalic and atraumatic.     Right Ear: Tympanic membrane normal.     Left Ear: Tympanic membrane normal.     Mouth/Throat:     Mouth: Mucous membranes are moist. No oral lesions.     Pharynx: Uvula midline. Pharyngeal swelling and posterior oropharyngeal erythema present. No oropharyngeal exudate.     Tonsils: No tonsillar exudate or tonsillar abscesses.  Eyes:     Conjunctiva/sclera: Conjunctivae normal.  Cardiovascular:     Rate and Rhythm: Normal rate and regular rhythm.     Heart sounds: No murmur heard.   Pulmonary:     Effort: Pulmonary effort is normal. No respiratory distress.     Breath sounds: Normal breath sounds.  Abdominal:     Palpations: Abdomen is soft.     Tenderness: There is no abdominal tenderness.  Musculoskeletal:     Cervical back: Neck supple.  Skin:    General: Skin is warm and dry.     Capillary Refill: Capillary refill takes less than 2 seconds.  Neurological:     General: No focal deficit present.     Mental Status: She is alert.     ED Results / Procedures / Treatments   Labs (all labs ordered are listed, but only abnormal results are displayed) Labs Reviewed  - No data to display  EKG None  Radiology No results found.  Procedures Procedures (including critical care time)  Medications Ordered in ED Medications  dexamethasone (DECADRON) tablet 6 mg (has no administration in time range)  amoxicillin-clavulanate (AUGMENTIN) 875-125 MG per tablet 1 tablet (has no administration in time range)    ED Course  I have reviewed the triage vital signs and the nursing notes.  Pertinent labs & imaging results that were available during  my care of the patient were reviewed by me and considered in my medical decision making (see chart for details).    MDM Rules/Calculators/A&P                         59 year old female here with sore throat.  Pharynx is erythematous, landmarks are symmetric.  Possible early peritonsillar abscess although will treat with steroids and antibiotics for pharyngitis.  Return instructions discussed.  Final Clinical Impression(s) / ED Diagnoses Final diagnoses:  Acute pharyngitis, unspecified etiology    Rx / DC Orders ED Discharge Orders         Ordered    amoxicillin-clavulanate (AUGMENTIN) 875-125 MG tablet  Every 12 hours        06/25/20 1535    HYDROcodone-acetaminophen (NORCO/VICODIN) 5-325 MG tablet  Every 6 hours PRN        06/25/20 1535           Hayden Rasmussen, MD 06/26/20 0930

## 2021-04-23 ENCOUNTER — Inpatient Hospital Stay (HOSPITAL_COMMUNITY): Payer: Self-pay

## 2021-04-23 ENCOUNTER — Inpatient Hospital Stay (HOSPITAL_COMMUNITY)
Admission: EM | Admit: 2021-04-23 | Discharge: 2021-04-26 | DRG: 917 | Disposition: A | Discharge status: Home / Self Care | Payer: Self-pay | Attending: Internal Medicine | Admitting: Internal Medicine

## 2021-04-23 ENCOUNTER — Emergency Department (HOSPITAL_COMMUNITY): Payer: Self-pay

## 2021-04-23 DIAGNOSIS — T405X1A Poisoning by cocaine, accidental (unintentional), initial encounter: Principal | ICD-10-CM | POA: Diagnosis present

## 2021-04-23 DIAGNOSIS — Z20822 Contact with and (suspected) exposure to covid-19: Secondary | ICD-10-CM | POA: Diagnosis present

## 2021-04-23 DIAGNOSIS — I248 Other forms of acute ischemic heart disease: Secondary | ICD-10-CM | POA: Diagnosis present

## 2021-04-23 DIAGNOSIS — N179 Acute kidney failure, unspecified: Secondary | ICD-10-CM | POA: Diagnosis present

## 2021-04-23 DIAGNOSIS — F32A Depression, unspecified: Secondary | ICD-10-CM | POA: Diagnosis present

## 2021-04-23 DIAGNOSIS — E872 Acidosis: Secondary | ICD-10-CM | POA: Diagnosis present

## 2021-04-23 DIAGNOSIS — J690 Pneumonitis due to inhalation of food and vomit: Secondary | ICD-10-CM | POA: Diagnosis present

## 2021-04-23 DIAGNOSIS — D649 Anemia, unspecified: Secondary | ICD-10-CM | POA: Diagnosis present

## 2021-04-23 DIAGNOSIS — E876 Hypokalemia: Secondary | ICD-10-CM | POA: Diagnosis present

## 2021-04-23 DIAGNOSIS — T50904A Poisoning by unspecified drugs, medicaments and biological substances, undetermined, initial encounter: Secondary | ICD-10-CM

## 2021-04-23 DIAGNOSIS — G928 Other toxic encephalopathy: Secondary | ICD-10-CM | POA: Diagnosis present

## 2021-04-23 DIAGNOSIS — J9602 Acute respiratory failure with hypercapnia: Secondary | ICD-10-CM | POA: Diagnosis present

## 2021-04-23 DIAGNOSIS — T50901A Poisoning by unspecified drugs, medicaments and biological substances, accidental (unintentional), initial encounter: Secondary | ICD-10-CM

## 2021-04-23 DIAGNOSIS — F1914 Other psychoactive substance abuse with psychoactive substance-induced mood disorder: Secondary | ICD-10-CM | POA: Diagnosis present

## 2021-04-23 DIAGNOSIS — I10 Essential (primary) hypertension: Secondary | ICD-10-CM | POA: Diagnosis present

## 2021-04-23 DIAGNOSIS — R680 Hypothermia, not associated with low environmental temperature: Secondary | ICD-10-CM | POA: Diagnosis present

## 2021-04-23 DIAGNOSIS — J9601 Acute respiratory failure with hypoxia: Secondary | ICD-10-CM

## 2021-04-23 DIAGNOSIS — T50902A Poisoning by unspecified drugs, medicaments and biological substances, intentional self-harm, initial encounter: Secondary | ICD-10-CM | POA: Diagnosis present

## 2021-04-23 DIAGNOSIS — T68XXXA Hypothermia, initial encounter: Secondary | ICD-10-CM

## 2021-04-23 DIAGNOSIS — R0902 Hypoxemia: Secondary | ICD-10-CM

## 2021-04-23 LAB — BASIC METABOLIC PANEL
Anion gap: 4 — ABNORMAL LOW (ref 5–15)
Anion gap: 2 — ABNORMAL LOW (ref 5–15)
Anion gap: 6 (ref 5–15)
BUN: 15 mg/dL (ref 6–20)
BUN: 16 mg/dL (ref 6–20)
BUN: 16 mg/dL (ref 6–20)
CO2: 30 mmol/L (ref 22–32)
CO2: 28 mmol/L (ref 22–32)
CO2: 24 mmol/L (ref 22–32)
Calcium: 8.2 mg/dL — ABNORMAL LOW (ref 8.9–10.3)
Calcium: 8.8 mg/dL — ABNORMAL LOW (ref 8.9–10.3)
Calcium: 8.8 mg/dL — ABNORMAL LOW (ref 8.9–10.3)
Chloride: 108 mmol/L (ref 98–111)
Chloride: 110 mmol/L (ref 98–111)
Chloride: 108 mmol/L (ref 98–111)
Creatinine, Ser: 0.9 mg/dL (ref 0.44–1.00)
Creatinine, Ser: 1.04 mg/dL — ABNORMAL HIGH (ref 0.44–1.00)
Creatinine, Ser: 0.93 mg/dL (ref 0.44–1.00)
GFR, Estimated: 43 mL/min — ABNORMAL LOW (ref >60)
GFR, Estimated: >60 mL/min (ref >60)
GFR, Estimated: >60 mL/min (ref >60)
Glucose, Bld: 107 mg/dL — ABNORMAL HIGH (ref 70–99)
Glucose, Bld: 110 mg/dL — ABNORMAL HIGH (ref 70–99)
Glucose, Bld: 80 mg/dL (ref 70–99)
Potassium: 3.5 mmol/L (ref 3.5–5.1)
Potassium: 4.2 mmol/L (ref 3.5–5.1)
Potassium: 4.6 mmol/L (ref 3.5–5.1)
Sodium: 142 mmol/L (ref 135–145)
Sodium: 140 mmol/L (ref 135–145)
Sodium: 138 mmol/L (ref 135–145)

## 2021-04-23 LAB — I-STAT ARTERIAL BLOOD GAS, ED
Acid-Base Excess: 5 mmol/L — ABNORMAL HIGH (ref 0.0–2.0)
Bicarbonate: 31.6 mmol/L — ABNORMAL HIGH (ref 20.0–28.0)
Calcium, Ion: 1.17 mmol/L (ref 1.15–1.40)
HCT: 34 % — ABNORMAL LOW (ref 36.0–46.0)
Hemoglobin: 11.6 g/dL — ABNORMAL LOW (ref 12.0–15.0)
O2 Saturation: 100 %
Patient temperature: 93.3
Potassium: 2.8 mmol/L — ABNORMAL LOW (ref 3.5–5.1)
Sodium: 144 mmol/L (ref 135–145)
TCO2: 33 mmol/L — ABNORMAL HIGH (ref 22–32)
pCO2 arterial: 48.6 mmHg — ABNORMAL HIGH (ref 32.0–48.0)
pH, Arterial: 7.407 (ref 7.350–7.450)
pO2, Arterial: 466 mmHg — ABNORMAL HIGH (ref 83.0–108.0)

## 2021-04-23 LAB — CBC WITH DIFFERENTIAL/PLATELET
Abs Immature Granulocytes: 0 10*3/uL (ref 0.00–0.07)
Basophils Absolute: 0.1 10*3/uL (ref 0.0–0.1)
Basophils Relative: 1 %
Eosinophils Absolute: 0 10*3/uL (ref 0.0–0.5)
Eosinophils Relative: 0 %
HCT: 44.4 % (ref 36.0–46.0)
Hemoglobin: 14.5 g/dL (ref 12.0–15.0)
Lymphocytes Relative: 55 %
Lymphs Abs: 6.9 10*3/uL — ABNORMAL HIGH (ref 0.7–4.0)
MCH: 28.7 pg (ref 26.0–34.0)
MCHC: 32.7 g/dL (ref 30.0–36.0)
MCV: 87.9 fL (ref 80.0–100.0)
Monocytes Absolute: 0.4 10*3/uL (ref 0.1–1.0)
Monocytes Relative: 3 %
Neutro Abs: 5.2 10*3/uL (ref 1.7–7.7)
Neutrophils Relative %: 41 %
Platelets: 245 10*3/uL (ref 150–400)
RBC: 5.05 MIL/uL (ref 3.87–5.11)
RDW: 13.5 % (ref 11.5–15.5)
WBC: 12.6 10*3/uL — ABNORMAL HIGH (ref 4.0–10.5)
nRBC: 0 % (ref 0.0–0.2)

## 2021-04-23 LAB — LACTIC ACID, PLASMA
Lactic Acid, Venous: 3.4 mmol/L (ref 0.5–1.9)
Lactic Acid, Venous: 1.6 mmol/L (ref 0.5–1.9)

## 2021-04-23 LAB — RESP PANEL BY RT-PCR (FLU A&B, COVID) ARPGX2
Influenza A by PCR: NEGATIVE
Influenza B by PCR: NEGATIVE
SARS Coronavirus 2 by RT PCR: NEGATIVE

## 2021-04-23 LAB — POCT I-STAT, CHEM 8
BUN: 20 mg/dL (ref 6–20)
Calcium, Ion: 1.06 mmol/L — ABNORMAL LOW (ref 1.15–1.40)
Chloride: 107 mmol/L (ref 98–111)
Creatinine, Ser: 1.3 mg/dL — ABNORMAL HIGH (ref 0.44–1.00)
Glucose, Bld: 167 mg/dL — ABNORMAL HIGH (ref 70–99)
HCT: 44 % (ref 36.0–46.0)
Hemoglobin: 15 g/dL (ref 12.0–15.0)
Potassium: 3.5 mmol/L (ref 3.5–5.1)
Sodium: 144 mmol/L (ref 135–145)
TCO2: 29 mmol/L (ref 22–32)

## 2021-04-23 LAB — URINALYSIS, ROUTINE W REFLEX MICROSCOPIC
Bilirubin Urine: NEGATIVE
Glucose, UA: NEGATIVE mg/dL
Hgb urine dipstick: NEGATIVE
Ketones, ur: NEGATIVE mg/dL
Leukocytes,Ua: NEGATIVE
Nitrite: NEGATIVE
Protein, ur: 30 mg/dL — AB
Specific Gravity, Urine: 1.025 (ref 1.005–1.030)
pH: 5 (ref 5.0–8.0)

## 2021-04-23 LAB — COMPREHENSIVE METABOLIC PANEL
ALT: 23 U/L (ref 0–44)
AST: 40 U/L (ref 15–41)
Albumin: 4 g/dL (ref 3.5–5.0)
Alkaline Phosphatase: 123 U/L (ref 38–126)
Anion gap: 12 (ref 5–15)
BUN: 18 mg/dL (ref 6–20)
CO2: 26 mmol/L (ref 22–32)
Calcium: 9.5 mg/dL (ref 8.9–10.3)
Chloride: 105 mmol/L (ref 98–111)
Creatinine, Ser: 1.3 mg/dL — ABNORMAL HIGH (ref 0.44–1.00)
GFR, Estimated: 28 mL/min — ABNORMAL LOW (ref >60)
Glucose, Bld: 165 mg/dL — ABNORMAL HIGH (ref 70–99)
Potassium: 3.5 mmol/L (ref 3.5–5.1)
Sodium: 143 mmol/L (ref 135–145)
Total Bilirubin: 0.4 mg/dL (ref 0.3–1.2)
Total Protein: 7.1 g/dL (ref 6.5–8.1)

## 2021-04-23 LAB — RAPID URINE DRUG SCREEN, HOSP PERFORMED
Amphetamines: NOT DETECTED
Barbiturates: NOT DETECTED
Benzodiazepines: NOT DETECTED
Cocaine: POSITIVE — AB
Opiates: NOT DETECTED
Tetrahydrocannabinol: POSITIVE — AB

## 2021-04-23 LAB — I-STAT CHEM 8, ED
BUN: 20 mg/dL (ref 8–23)
Calcium, Ion: 1.06 mmol/L — ABNORMAL LOW (ref 1.15–1.40)
Chloride: 107 mmol/L (ref 98–111)
Creatinine, Ser: 1.3 mg/dL — ABNORMAL HIGH (ref 0.44–1.00)
Glucose, Bld: 167 mg/dL — ABNORMAL HIGH (ref 70–99)
HCT: 44 % (ref 36.0–46.0)
Hemoglobin: 15 g/dL (ref 12.0–15.0)
Potassium: 3.5 mmol/L (ref 3.5–5.1)
Sodium: 144 mmol/L (ref 135–145)
TCO2: 29 mmol/L (ref 22–32)

## 2021-04-23 LAB — TROPONIN I (HIGH SENSITIVITY)
Troponin I (High Sensitivity): 33 ng/L — ABNORMAL HIGH (ref <18)
Troponin I (High Sensitivity): 84 ng/L — ABNORMAL HIGH (ref <18)
Troponin I (High Sensitivity): 71 ng/L — ABNORMAL HIGH (ref <18)
Troponin I (High Sensitivity): 32 ng/L — ABNORMAL HIGH (ref <18)

## 2021-04-23 LAB — ACETAMINOPHEN LEVEL: Acetaminophen (Tylenol), Serum: <10 ug/mL — ABNORMAL LOW (ref 10–30)

## 2021-04-23 LAB — GLUCOSE, CAPILLARY
Glucose-Capillary: 113 mg/dL — ABNORMAL HIGH (ref 70–99)
Glucose-Capillary: 123 mg/dL — ABNORMAL HIGH (ref 70–99)
Glucose-Capillary: 112 mg/dL — ABNORMAL HIGH (ref 70–99)
Glucose-Capillary: 106 mg/dL — ABNORMAL HIGH (ref 70–99)
Glucose-Capillary: 108 mg/dL — ABNORMAL HIGH (ref 70–99)
Glucose-Capillary: 119 mg/dL — ABNORMAL HIGH (ref 70–99)

## 2021-04-23 LAB — I-STAT BETA HCG BLOOD, ED (MC, WL, AP ONLY): I-stat hCG, quantitative: <5 m[IU]/mL (ref <5)

## 2021-04-23 LAB — LIPASE, BLOOD: Lipase: 116 U/L — ABNORMAL HIGH (ref 11–51)

## 2021-04-23 LAB — MRSA NEXT GEN BY PCR, NASAL: MRSA by PCR Next Gen: NOT DETECTED

## 2021-04-23 LAB — TRIGLYCERIDES: Triglycerides: 52 mg/dL (ref <150)

## 2021-04-23 LAB — ETHANOL: Alcohol, Ethyl (B): <10 mg/dL (ref <10)

## 2021-04-23 LAB — CK: Total CK: 224 U/L (ref 38–234)

## 2021-04-23 LAB — SALICYLATE LEVEL: Salicylate Lvl: <7 mg/dL — ABNORMAL LOW (ref 7.0–30.0)

## 2021-04-23 LAB — CBG MONITORING, ED: Glucose-Capillary: 172 mg/dL — ABNORMAL HIGH (ref 70–99)

## 2021-04-23 MED ORDER — ROCURONIUM BROMIDE 10 MG/ML (PF) SYRINGE
PREFILLED_SYRINGE | INTRAVENOUS | Status: AC
  Filled 2021-04-23: qty 10

## 2021-04-23 MED ORDER — INSULIN ASPART 100 UNIT/ML IJ SOLN
0.0000 [IU] | INTRAMUSCULAR | Status: DC
  Administered 2021-04-23 – 2021-04-24 (×2): 1 [IU] via SUBCUTANEOUS
  Administered 2021-04-25 (×2): 2 [IU] via SUBCUTANEOUS
  Administered 2021-04-26: 1 [IU] via SUBCUTANEOUS

## 2021-04-23 MED ORDER — CHLORHEXIDINE GLUCONATE CLOTH 2 % EX PADS
6.0000 | MEDICATED_PAD | Freq: Every day | CUTANEOUS | Status: DC
  Administered 2021-04-23 – 2021-04-24 (×2): 6 via TOPICAL

## 2021-04-23 MED ORDER — DOCUSATE SODIUM 50 MG/5ML PO LIQD
100.0000 mg | Freq: Two times a day (BID) | ORAL | Status: DC | PRN
  Filled 2021-04-23: qty 10

## 2021-04-23 MED ORDER — HYDRALAZINE HCL 20 MG/ML IJ SOLN
10.0000 mg | INTRAMUSCULAR | Status: DC | PRN
  Administered 2021-04-23 – 2021-04-26 (×3): 20 mg via INTRAVENOUS
  Filled 2021-04-23 (×3): qty 1

## 2021-04-23 MED ORDER — PIPERACILLIN-TAZOBACTAM 3.375 G IVPB 30 MIN
3.3750 g | Freq: Once | INTRAVENOUS | Status: AC
  Administered 2021-04-23: 3.375 g via INTRAVENOUS
  Filled 2021-04-23: qty 50

## 2021-04-23 MED ORDER — ONDANSETRON HCL 4 MG/2ML IJ SOLN
4.0000 mg | Freq: Once | INTRAMUSCULAR | Status: AC
  Administered 2021-04-23: 4 mg via INTRAVENOUS
  Filled 2021-04-23: qty 2

## 2021-04-23 MED ORDER — ETOMIDATE 2 MG/ML IV SOLN
INTRAVENOUS | Status: AC
  Filled 2021-04-23: qty 10

## 2021-04-23 MED ORDER — ZIPRASIDONE MESYLATE 20 MG IM SOLR: 20.0000 mg | Freq: Once | INTRAMUSCULAR | Status: DC

## 2021-04-23 MED ORDER — ETOMIDATE 2 MG/ML IV SOLN
INTRAVENOUS | Status: AC
  Filled 2021-04-23: qty 20

## 2021-04-23 MED ORDER — SUCCINYLCHOLINE CHLORIDE 200 MG/10ML IV SOSY
PREFILLED_SYRINGE | INTRAVENOUS | Status: AC
  Filled 2021-04-23: qty 10

## 2021-04-23 MED ORDER — SODIUM CHLORIDE 0.9 % IV BOLUS
1000.0000 mL | Freq: Once | INTRAVENOUS | Status: AC
  Administered 2021-04-23: 1000 mL via INTRAVENOUS

## 2021-04-23 MED ORDER — PROPOFOL 1000 MG/100ML IV EMUL
5.0000 ug/kg/min | INTRAVENOUS | Status: DC
  Administered 2021-04-23: 30 ug/kg/min via INTRAVENOUS
  Administered 2021-04-23: 10 ug/kg/min via INTRAVENOUS
  Administered 2021-04-23: 20 ug/kg/min via INTRAVENOUS
  Filled 2021-04-23 (×2): qty 100

## 2021-04-23 MED ORDER — ORAL CARE MOUTH RINSE
15.0000 mL | OROMUCOSAL | Status: DC
  Administered 2021-04-23 – 2021-04-24 (×11): 15 mL via OROMUCOSAL

## 2021-04-23 MED ORDER — SODIUM CHLORIDE 0.9 % IV SOLN
INTRAVENOUS | Status: DC | PRN
  Administered 2021-04-23: 250 mL via INTRAVENOUS
  Administered 2021-04-24: 500 mL via INTRAVENOUS
  Administered 2021-04-25 – 2021-04-26 (×2): 250 mL via INTRAVENOUS

## 2021-04-23 MED ORDER — POLYETHYLENE GLYCOL 3350 17 G PO PACK: 17.0000 g | PACK | Freq: Every day | ORAL | Status: DC | PRN

## 2021-04-23 MED ORDER — FAMOTIDINE IN NACL 20-0.9 MG/50ML-% IV SOLN
20.0000 mg | Freq: Two times a day (BID) | INTRAVENOUS | Status: DC
  Administered 2021-04-23 (×3): 20 mg via INTRAVENOUS
  Filled 2021-04-23 (×5): qty 50

## 2021-04-23 MED ORDER — FENTANYL CITRATE (PF) 100 MCG/2ML IJ SOLN
100.0000 ug | INTRAMUSCULAR | Status: DC | PRN
  Administered 2021-04-23: 100 ug via INTRAVENOUS
  Filled 2021-04-23: qty 2

## 2021-04-23 MED ORDER — LACTATED RINGERS IV SOLN: INTRAVENOUS | Status: DC

## 2021-04-23 MED ORDER — LACTATED RINGERS IV BOLUS
1000.0000 mL | Freq: Once | INTRAVENOUS | Status: AC
  Administered 2021-04-23: 1000 mL via INTRAVENOUS

## 2021-04-23 MED ORDER — CHLORHEXIDINE GLUCONATE 0.12% ORAL RINSE (MEDLINE KIT)
15.0000 mL | Freq: Two times a day (BID) | OROMUCOSAL | Status: DC
  Administered 2021-04-23 – 2021-04-24 (×3): 15 mL via OROMUCOSAL

## 2021-04-23 MED ORDER — PIPERACILLIN-TAZOBACTAM 3.375 G IVPB
3.3750 g | Freq: Three times a day (TID) | INTRAVENOUS | Status: DC
  Administered 2021-04-23 – 2021-04-24 (×4): 3.375 g via INTRAVENOUS
  Filled 2021-04-23 (×4): qty 50

## 2021-04-23 MED ORDER — ENOXAPARIN SODIUM 40 MG/0.4ML IJ SOSY
40.0000 mg | PREFILLED_SYRINGE | INTRAMUSCULAR | Status: DC
  Administered 2021-04-23 – 2021-04-24 (×2): 40 mg via SUBCUTANEOUS
  Filled 2021-04-23 (×4): qty 0.4

## 2021-04-23 MED ORDER — ROCURONIUM BROMIDE 50 MG/5ML IV SOLN
INTRAVENOUS | Status: AC | PRN
  Administered 2021-04-23: 80 mg via INTRAVENOUS

## 2021-04-23 MED ORDER — ETOMIDATE 2 MG/ML IV SOLN
INTRAVENOUS | Status: AC | PRN
  Administered 2021-04-23: 20 mg via INTRAVENOUS

## 2021-04-23 MED ORDER — DEXMEDETOMIDINE HCL IN NACL 400 MCG/100ML IV SOLN
0.4000 ug/kg/h | INTRAVENOUS | Status: DC
  Administered 2021-04-23: 0.4 ug/kg/h via INTRAVENOUS
  Filled 2021-04-23: qty 100

## 2021-04-23 MED ORDER — POTASSIUM CHLORIDE 10 MEQ/100ML IV SOLN
10.0000 meq | INTRAVENOUS | Status: AC
  Administered 2021-04-23 (×4): 10 meq via INTRAVENOUS
  Filled 2021-04-23 (×6): qty 100

## 2021-04-23 MED ORDER — FENTANYL CITRATE (PF) 100 MCG/2ML IJ SOLN
100.0000 ug | INTRAMUSCULAR | Status: DC | PRN
Start: 2021-04-23 — End: 2021-04-24

## 2021-04-23 MED ORDER — POTASSIUM CHLORIDE 10 MEQ/100ML IV SOLN: 10.0000 meq | INTRAVENOUS | Status: AC

## 2021-04-23 MED ORDER — LORAZEPAM 2 MG/ML IJ SOLN: 2.0000 mg | Freq: Once | INTRAMUSCULAR | Status: DC

## 2021-04-23 NOTE — Progress Notes (Signed)
Pharmacy Antibiotic Note  Morgan Davenport is an unknown female admitted on 04/23/2021 for drug OD with concern for aspiration pneumonia.  Pharmacy has been consulted for Zosyn dosing.  Plan: Zosyn 3.375g IV q8h (4 hour infusion).  Height: '5\' 2"'$  (157.5 cm) Weight: 49.9 kg (110 lb) IBW/kg (Calculated) : 50.1  Temp (24hrs), Avg:93.8 F (34.3 C), Min:93.3 F (34.1 C), Max:94.1 F (34.5 C)  Recent Labs  Lab 04/23/21 0104 04/23/21 0105 04/23/21 0136  WBC 12.6*  --   --   CREATININE 1.30*  --  1.30*  LATICACIDVEN  --  3.4*  --     Estimated Creatinine Clearance: -2.3 mL/min (A) (by C-G formula based on SCr of 1.3 mg/dL (H)).     Thank you for allowing pharmacy to be a part of this patient's care.  Rogue Bussing 04/23/2021 2:42 AM

## 2021-04-23 NOTE — Progress Notes (Signed)
Pt placed back on full support due to RR of 7 and Ve of 3. RN at bedside. RT to continue to monitor.

## 2021-04-23 NOTE — ED Provider Notes (Signed)
  [facility] EMERGENCY DEPARTMENT Provider Note      Procedures Procedure Name: Intubation Date/Time: 04/23/2021 1:21 AM Performed by: Montine Circle, PA-C Pre-anesthesia Checklist: Patient identified, Emergency Drugs available, Suction available, Timeout performed and Patient being monitored Oxygen Delivery Method: Ambu bag Preoxygenation: Pre-oxygenation with 100% oxygen Induction Type: Rapid sequence Ventilation: Mask ventilation without difficulty Laryngoscope Size: 4 and Glidescope Tube size: 7.5 mm Number of attempts: 1 Airway Equipment and Method: Video-laryngoscopy Placement Confirmation: ETT inserted through vocal cords under direct vision, CO2 detector, Breath sounds checked- equal and bilateral and Positive ETCO2 Secured at: 25 cm Tube secured with: ETT holder Dental Injury: Teeth and Oropharynx as per pre-operative assessment         Montine Circle, PA-C 04/23/21 0122    Ezequiel Essex, MD 04/23/21 [phone]

## 2021-04-23 NOTE — Sedation Documentation (Addendum)
7 1/2 ETT successfully placed, 25 at the lips. +Color change, BBSE. Intubation completed by Lorre Munroe PA, Rancour MD, RT at bedside.

## 2021-04-23 NOTE — ED Notes (Signed)
Bear hugger applied 

## 2021-04-23 NOTE — ED Provider Notes (Signed)
[facility] EMERGENCY DEPARTMENT Provider Note   CSN: [id number] Arrival date & time: 04/23/21  0048     History No chief complaint on file.   Morgan Davenport is a 60 y.o. female.  Level 5 caveat for altered mental status and acuity of condition.  64s female Patient brought in by EMS after apparent overdose.  EMS called to a hotel where patient was found obtunded and apneic.  She was given IM as well as IV Narcan.  She is now screaming and agitated and vomiting.  Unable to give any history.  Will not answer questions.  Unknown past medical history.  Unknown identity. Person who called EMS and police did not stick around. Police report no evidence of drug paraphernalia in the hotel room room was very clean. Patient agitated, vomiting, thrashing around the bed.  The history is provided by the patient and the EMS personnel. The history is limited by the condition of the patient.      No past medical history on file.  There are no problems to display for this patient.   The histories are not reviewed yet. Please review them in the "History" navigator section and refresh this [place].   OB History   No obstetric history on file.     No family history on file.     Home Medications Prior to Admission medications   Not on File    Allergies    Patient has no allergy information on record.  Review of Systems   Review of Systems  Unable to perform ROS: Mental status change   Physical Exam Updated Vital Signs BP (!) 185/110   Pulse 83   Resp 18   Ht '5\' 2"'$  (1.575 m)   Wt 49.9 kg   SpO2 100%   BMI 20.12 kg/m   Physical Exam Constitutional:      General: She is not in acute distress.    Appearance: Normal appearance. She is ill-appearing and diaphoretic.     Comments: Diaphoretic, yawning, intermittently yelling Not speaking or answering questions Combative, thrashing around the bed  HENT:     Head: Normocephalic.  Eyes:     Comments: Dilated  pupils  Cardiovascular:     Rate and Rhythm: Regular rhythm. Tachycardia present.     Heart sounds: No murmur heard. Pulmonary:     Effort: Pulmonary effort is normal.     Breath sounds: No wheezing.  Chest:     Chest wall: No tenderness.  Abdominal:     Tenderness: There is no abdominal tenderness. There is no guarding or rebound.  Musculoskeletal:        General: No swelling or tenderness. Normal range of motion.     Cervical back: Normal range of motion and neck supple.  Skin:    General: Skin is warm.  Neurological:     Mental Status: She is alert.     Comments: Moving all extremities, would not follow commands or answer questions.  Nonverbal. Agitated and around the bed.  Intermittently yells.    ED Results / Procedures / Treatments   Labs (all labs ordered are listed, but only abnormal results are displayed) Labs Reviewed  CBC WITH DIFFERENTIAL/PLATELET - Abnormal; Notable for the following components:      Result Value   WBC 12.6 (*)    Lymphs Abs 6.9 (*)    All other components within normal limits  COMPREHENSIVE METABOLIC PANEL - Abnormal; Notable for the following components:   Glucose,  Bld 165 (*)    Creatinine, Ser 1.30 (*)    GFR, Estimated 28 (*)    All other components within normal limits  LIPASE, BLOOD - Abnormal; Notable for the following components:   Lipase 116 (*)    All other components within normal limits  LACTIC ACID, PLASMA - Abnormal; Notable for the following components:   Lactic Acid, Venous 3.4 (*)    All other components within normal limits  URINALYSIS, ROUTINE W REFLEX MICROSCOPIC - Abnormal; Notable for the following components:   Protein, ur 30 (*)    Bacteria, UA RARE (*)    All other components within normal limits  CBG MONITORING, ED - Abnormal; Notable for the following components:   Glucose-Capillary 172 (*)    All other components within normal limits  I-STAT ARTERIAL BLOOD GAS, ED - Abnormal; Notable for the following  components:   pCO2 arterial 48.6 (*)    pO2, Arterial 466 (*)    Bicarbonate 31.6 (*)    TCO2 33 (*)    Acid-Base Excess 5.0 (*)    Potassium 2.8 (*)    HCT 34.0 (*)    Hemoglobin 11.6 (*)    All other components within normal limits  I-STAT CHEM 8, ED - Abnormal; Notable for the following components:   Creatinine, Ser 1.30 (*)    Glucose, Bld 167 (*)    Calcium, Ion 1.06 (*)    All other components within normal limits  CULTURE, BLOOD (ROUTINE X 2)  CULTURE, BLOOD (ROUTINE X 2)  RESP PANEL BY RT-PCR (FLU A&B, COVID) ARPGX2  CULTURE, RESPIRATORY W GRAM STAIN  CK  ACETAMINOPHEN LEVEL  SALICYLATE LEVEL  ETHANOL  RAPID URINE DRUG SCREEN, HOSP PERFORMED  LACTIC ACID, PLASMA  CBC  CREATININE, SERUM  HEMOGLOBIN A1C  I-STAT BETA HCG BLOOD, ED ([facility], [facility], [facility] ONLY)  TROPONIN I (HIGH SENSITIVITY)    EKG EKG Interpretation  Date/Time:  Tuesday April 23 2021 00:57:19 EDT Ventricular Rate:  84 PR Interval:  154 QRS Duration: 92 QT Interval:  414 QTC Calculation: 490 R Axis:   87 Text Interpretation: Sinus rhythm Consider left atrial enlargement Borderline right axis deviation Consider left ventricular hypertrophy Borderline prolonged QT interval Baseline wander in lead(s) I II aVR No previous ECGs available Confirmed by Ezequiel Essex [phone]) on 04/23/2021 1:23:27 AM  Radiology DG Chest Portable 1 View  Result Date: 04/23/2021 CLINICAL DATA:  Overdose. EXAM: PORTABLE CHEST 1 VIEW COMPARISON:  None. FINDINGS: An endotracheal tube is seen with its distal tip approximately 2.2 cm from the carina. A nasogastric tube is noted with its distal end extending into the gastric lumen. Mild, diffuse, chronic appearing increased lung markings are seen. There is no evidence of acute infiltrate, pleural effusion or pneumothorax. The heart size and mediastinal contours are within normal limits. Mild calcification of the aortic arch is noted. The visualized skeletal structures are unremarkable.  IMPRESSION: Chronic-appearing increased lung markings without acute or active cardiopulmonary disease. Electronically Signed   By: Virgina Norfolk M.D.   On: 04/23/2021 03:26   DG Abd Portable 1 View  Result Date: 04/23/2021 CLINICAL DATA:  Overdose. EXAM: PORTABLE ABDOMEN - 1 VIEW COMPARISON:  None. FINDINGS: A nasogastric tube is seen with its distal tip noted within the body of the stomach. The distal side hole is approximately 9.8 cm distal to the gastroesophageal junction. The stomach is moderately distended. The bowel gas pattern is otherwise normal. A moderate amount of stool is seen throughout the colon. No  radio-opaque calculi or other significant radiographic abnormality are seen. IMPRESSION: 1. Nasogastric tube positioning, as described above. 2. Moderate stool burden without evidence of bowel obstruction. Electronically Signed   By: Virgina Norfolk M.D.   On: 04/23/2021 03:27    Procedures .Critical Care  Date/Time: 04/23/2021 2:39 AM Performed by: Ezequiel Essex, MD Authorized by: Ezequiel Essex, MD   Critical care provider statement:    Critical care time (minutes):  60   Critical care was necessary to treat or prevent imminent or life-threatening deterioration of the following conditions:  Respiratory failure and toxidrome   Critical care was time spent personally by me on the following activities:  Discussions with consultants, evaluation of patient's response to treatment, examination of patient, ordering and performing treatments and interventions, ordering and review of laboratory studies, ordering and review of radiographic studies, pulse oximetry, re-evaluation of patient's condition, obtaining history from patient or surrogate and review of old charts   Medications Ordered in ED Medications  ondansetron (ZOFRAN) injection 4 mg (has no administration in time range)  sodium chloride 0.9 % bolus 1,000 mL (has no administration in time range)  propofol (DIPRIVAN) 1000  MG/100ML infusion (10 mcg/kg/min  49.9 kg Intravenous New Bag/Given 04/23/21 0121)  succinylcholine (ANECTINE) 200 MG/10ML syringe (has no administration in time range)  rocuronium bromide 100 MG/10ML SOSY (has no administration in time range)  etomidate (AMIDATE) 2 MG/ML injection (has no administration in time range)  etomidate (AMIDATE) injection (20 mg Intravenous Given 04/23/21 0113)  rocuronium (ZEMURON) injection (80 mg Intravenous Given 04/23/21 0114)  etomidate (AMIDATE) 2 MG/ML injection (has no administration in time range)    ED Course  I have reviewed the triage vital signs and the nursing notes.  Pertinent labs & imaging results that were available during my care of the patient were reviewed by me and considered in my medical decision making (see chart for details).    MDM Rules/Calculators/A&P                          Apparent polysubstance overdose.  Received Narcan from EMS.  She is now alert but agitated and combative.  Not able to give any history.  She is diaphoretic, thrashing on the bed and actively vomiting. Concern for ability to protect airway.  Decision made to intubate.  Performed by Allene Pyo under my direct supervision.  With diaphoresis there is some concern for sympathomimetic component to her overdose.  Patient is hypothermic and placed on a [employer].  Labs show mild metabolic lactic acidosis.  Anion gap is normal. CT head pending. Ethanol level negative.  Acetaminophen and salicylate levels negative.  Drug screen positive for cocaine and THC. Antibiotics initiated for concern for aspiration pneumonia.  She remains persistently hypothermic and was placed on [facility].  Admission to ICU for respiratory failure in setting of likely probably substance overdose.  D/w Dr. Tacy Learn. CT head pending.  Final Clinical Impression(s) / ED Diagnoses Final diagnoses:  Acute respiratory failure with hypoxia ([place])  Drug overdose, undetermined intent, initial  encounter  Hypothermia, initial encounter    Rx / DC Orders ED Discharge Orders     None        Norvil Martensen, Annie Main, MD 04/23/21 [phone]

## 2021-04-23 NOTE — ED Notes (Signed)
IV fluids switched to warm fluids, warm blankets placed on patient

## 2021-04-23 NOTE — Progress Notes (Signed)
Respiratory culture collected, sent to lab.

## 2021-04-23 NOTE — ED Notes (Signed)
OG 59fplaced by JDarl Householder

## 2021-04-23 NOTE — Progress Notes (Addendum)
Patient ID: Morgan Davenport, female   DOB: 09/29/1876, 60 y.o.   MRN: [id number]   NAME:  Morgan Davenport, MRN:  [id number], DOB:  09/29/1876, LOS: 0 ADMISSION DATE:  04/23/2021, CONSULTATION DATE: 04/23/2021 REFERRING MD:  Ezequiel Essex, MD , CHIEF COMPLAINT: Found unresponsive  History of Present Illness:  Patient is deeply sedated and intubated so most of the history is taken from ED/EMS note  Middle-aged unknown female who was brought into the emergency department from a motel, after EMS was notified of apparent overdose.  Patient was found obtunded and apneic, she was given IM and IV Narcan with no initial response.  In the emergency department she started screaming, agitated and vomited multiple times with visible aspiration into airway, requiring endotracheal intubation to protect airway.  Person who called EMS did not stick around.  Patient is noted to be hypothermic with temperature 56F, placed on warming blanket  Pertinent  Medical History  Unknown past medical history  Significant Hospital Events: Including procedures, antibiotic start and stop dates in addition to other pertinent events   7/26 admitted and intubated  Interim History / Subjective:  Husband at bedside today. Mentions history of polysubstance use and prior hospitalization about 1 year ago with similar presentation, but does not know what sorts of substances. He also mentions history of mental health issues with prior hospitalization and that patient sees psychiatry, but does not take medications. He states that when she does not take her medications she often does drugs and becomes violent.  He states that she took his jeep yesterday evening around 4/5pm and that he did not know where she was and was unable to get in contact with her until early this morning when he was informed that she was in the hospital.   Objective   Blood pressure 120/61, pulse (!) 58, temperature 98.4 F (36.9 C), resp. rate 18, height '5\' 2"'$   (1.575 m), weight 47.7 kg, SpO2 100 %.    Vent Mode: PRVC FiO2 (%):  [40 %-100 %] 40 % Set Rate:  [18 bmp] 18 bmp Vt Set:  [400 mL] 400 mL PEEP:  [5 cmH20] 5 cmH20 Plateau Pressure:  [13 cmH20-15 cmH20] 13 cmH20   Intake/Output Summary (Last 24 hours) at 04/23/2021 1033 Last data filed at 04/23/2021 0800 Gross per 24 hour  Intake 1264.8 ml  Output 100 ml  Net 1164.8 ml   Filed Weights   04/23/21 0057 04/23/21 0500  Weight: 49.9 kg 47.7 kg    Examination:   Physical exam: General:  ill-appearing, agitated middle-aged African-American female, orally intubated HEENT: Orally intubated. No scleral icterus.  Neuro: Sedated, not following commands.  Eyes open to voice. Pupils 4 mm bilateral quickly reactive to light. Moving all 4 extremities spontaneously against gravity with purposeful movement (appearing to try to pull at ETT, but is restrained to prevent this).  Chest: Coarse breath sounds, with rhonchi bilaterally. no wheezes Heart: Regular rate and rhythm, no murmurs or gallops Abdomen: Soft, nontender, nondistended, bowel sounds present   Resolved Hospital Problem list     Assessment & Plan:   Acute toxic encephalopathy likely secondary to drug overdose -  improving, patient is awake and opening eyes to voice, but not following commands still.  - respiratory therapy to come evaluate for possible extubation given patient is now awake and active   Acute hypoxic/hypercapnic respiratory failure in the setting of apparent drug overdose - intubated and on minimal vent settings  - respiratory therapy to come evaluate  for possible extubation given patient is now awake and active - Continue full vent support  - Minimize sedation with RASS goal -1 - VAP bundle - wean precedex for sedation   Probable aspiration pneumonia - witnessed aspiration in the ED and rare Gram positive rods and cocci seen on Gram stain. Initial CXR negative. WBC count increased at 12.6.  - continue IV zosyn,  will adjust when cultures get back   - f/u repeat CXR - f/u cultures - daily CBC - DC IV hydration with LR  Elevated Troponins -  elevated to 33; likely demand ischemia in setting of cocaine use - trend troponins  Anemia - Hgb dropped from 15.0 to 11.6, likely dilutional after fluid resuscitation  - trend daily CBC  Hypothermia - resolved with warming blanket  Hypokalemia - Serum K+ at 2.8 - replete 10 mEq Q1H x5  - daily BMET  Hypocalcemia - serum Ca2+ 8.2, but ionized was 1.06  - trend daily BMET   Lactic acidosis - resolved (1.6 <<3.4)   Multi drug use - UDS positive for cocaine and THC   Behavioral Health issues -  Hx of mental health problems, seen by psychiatry, off medication.  Polysubstance abuse - psychiatric follow up outpatient   Best Practice (right click and "Reselect all SmartList Selections" daily)   Diet/type: NPO DVT prophylaxis: LMWH GI prophylaxis: H2B Lines: N/A Foley:  N/A Code Status:  full code Last date of multidisciplinary goals of care discussion [pending]  Labs   CBC: Recent Labs  Lab 04/23/21 0104 04/23/21 0136 04/23/21 0216  WBC 12.6*  --   --   NEUTROABS 5.2  --   --   HGB 14.5 15.0 11.6*  HCT 44.4 44.0 34.0*  MCV 87.9  --   --   PLT 245  --   --     Basic Metabolic Panel: Recent Labs  Lab 04/23/21 0104 04/23/21 0136 04/23/21 0216 04/23/21 0550  NA 143 144 144 142  K 3.5 3.5 2.8* 3.5  CL 105 107  --  108  CO2 26  --   --  30  GLUCOSE 165* 167*  --  107*  BUN 18 20  --  15  CREATININE 1.30* 1.30*  --  0.90  CALCIUM 9.5  --   --  8.2*   GFR: Estimated Creatinine Clearance: -3.1 mL/min (by C-G formula based on SCr of 0.9 mg/dL). Recent Labs  Lab 04/23/21 0104 04/23/21 0105 04/23/21 0305  WBC 12.6*  --   --   LATICACIDVEN  --  3.4* 1.6    Liver Function Tests: Recent Labs  Lab 04/23/21 0104  AST 40  ALT 23  ALKPHOS 123  BILITOT 0.4  PROT 7.1  ALBUMIN 4.0   Recent Labs  Lab 04/23/21 0104  LIPASE  116*   No results for input(s): AMMONIA in the last 168 hours.  ABG    Component Value Date/Time   PHART 7.407 04/23/2021 0216   PCO2ART 48.6 (H) 04/23/2021 0216   PO2ART 466 (H) 04/23/2021 0216   HCO3 31.6 (H) 04/23/2021 0216   TCO2 33 (H) 04/23/2021 0216   O2SAT 100.0 04/23/2021 0216     Coagulation Profile: No results for input(s): INR, PROTIME in the last 168 hours.  Cardiac Enzymes: Recent Labs  Lab 04/23/21 0104  CKTOTAL 224    HbA1C: No results found for: HGBA1C  CBG: Recent Labs  Lab 04/23/21 0102 04/23/21 0712  GLUCAP 172* 123*    Review of Systems:  Unable to obtain as patient is intubated and sedated  Past Medical History:  She,  has no past medical history on file.   Surgical History:    Social History:     Unable to obtain as patient is sedated and intubated Family History:  Her family history is not on file.   Allergies Not on File   Home Medications  Prior to Admission medications   Not on File      I have taken an interval history, reviewed the chart and examined the patient. I agree with the Advanced Practitioner's note, impression, and recommendations as outlined.   Briefly, 60 year old female with polysubstance abuse admitted for drug overdose. Intubated in ED for agitation and altered mental status. Witnessed aspiration.  This morning awake and attempting to pull ETT  Blood pressure 120/61, pulse (!) 58, temperature 98.4 F (36.9 C), resp. rate 18, height '5\' 2"'$  (1.575 m), weight 47.7 kg, SpO2 100 %. On exam, awake alert female, nods head appropriately to questions however intermittently drowsy and apneic. Lungs coarse bilaterally, no wheezing. Heart bradycardic, regular rate.  CT head 7/26 - neg CXR 7/26 - no acute infiltrate, effusion or edema  Assessment/Plan  Acute hypoxemic respiratory failure secondary aspiration pneumonitis/pneumonia Acute toxic encephalopathy - UDS + cocaine, cannabis Hypokalemia  Mental status  has improved. Currently on mechanical ventilation on minimal settings. Wean off sedation and SBT. If tolerates will plan to extubate. Continue antibiotics. Replete potassium.  The patient is critically ill with multiple organ systems failure and requires high complexity decision making for assessment and support, frequent evaluation and titration of therapies, application of advanced monitoring technologies and extensive interpretation of multiple databases.  Independent Critical Care Time: 70 Minutes.   Rodman Pickle, M.D. [facility] Pulmonary/Critical Care Medicine 04/23/2021 11:25 AM   Please see Amion for pager number to reach on-call Pulmonary and Critical Care Team.

## 2021-04-23 NOTE — ED Notes (Signed)
Pt woke and began pulling at vent and thrashing in bed. PRN medication obtained and administered.

## 2021-04-23 NOTE — ED Triage Notes (Signed)
Brought in by [facility] EMS from [facility] - pt was found as overdose - Unknown opiates.    2 narcan IN. 2 narcan IV.   EMS could not get any hx and any info.

## 2021-04-23 NOTE — H&P (Signed)
NAME:  [name], MRN:  [id number], DOB:  09/29/1876, LOS: 0 ADMISSION DATE:  04/23/2021, CONSULTATION DATE: 04/23/2021 REFERRING MD:  Ezequiel Essex, MD , CHIEF COMPLAINT: Found unresponsive  History of Present Illness:  Patient is deeply sedated and intubated so most of the history is taken from ED/EMS note  Middle-aged unknown female who was brought into the emergency department from a motel, after EMS was notified of apparent overdose.  Patient was found obtunded and apneic, she was given IM and IV Narcan with no initial response.  In the emergency department she started screaming, agitated and vomited multiple times with visible aspiration into airway, requiring endotracheal intubation to protect airway.  Person who called EMS did not stick around.  Patient is noted to be hypothermic with temperature 1F, placed on warming blanket  Pertinent  Medical History  Unknown past medical history  Significant Hospital Events: Including procedures, antibiotic start and stop dates in addition to other pertinent events   7/26 admitted and intubated  Interim History / Subjective:    Objective   Blood pressure (!) 152/75, pulse 61, temperature (!) 93.3 F (34.1 C), resp. rate 18, height '5\' 2"'$  (1.575 m), weight 49.9 kg, SpO2 100 %.    Vent Mode: PRVC FiO2 (%):  [60 %-100 %] 60 % Set Rate:  [18 bmp] 18 bmp Vt Set:  [400 mL] 400 mL PEEP:  [5 cmH20] 5 cmH20 Plateau Pressure:  [13 cmH20] 13 cmH20  No intake or output data in the 24 hours ending 04/23/21 0228 Filed Weights   04/23/21 0057  Weight: 49.9 kg    Examination:   Physical exam: General: Crtitically ill-appearing middle-aged African-American female, orally intubated HEENT: /AT, eyes anicteric.  ETT and OGT in place.  Large amount of vomitus noted in the pharynx Neuro: Sedated, not following commands.  Eyes are closed.  Pupils 4 mm bilateral sluggishly reactive to light Chest: Coarse breath sounds, with rhonchi, no  wheezes Heart: Regular rate and rhythm, no murmurs or gallops Abdomen: Soft, nontender, nondistended, bowel sounds present Skin: No rash    Resolved Hospital Problem list     Assessment & Plan:  Acute hypoxic/hypercapnic respiratory failure in the setting of apparent drug overdose Toxic encephalopathy Probable aspiration pneumonia Hypothermia Hypokalemia Lactic acidosis  Patient is intubated currently on mechanical ventilation Continue lung protective ventilation Aspiration precautions VAP bundle Get respiratory culture Ventilator setting was adjusted based on current ABGs Started on IV Zosyn, adjust antibiotic once her respiratory culture and sensitivities are back Continue warming blanket Minimize sedation with RASS goal -1 Urine tox screen is in process Patient serum potassium is 2.8, IV supplementation is ordered Repeat electrolytes in the morning Continue IV fluid resuscitation Trend lactate   Best Practice (right click and "Reselect all SmartList Selections" daily)   Diet/type: NPO DVT prophylaxis: LMWH GI prophylaxis: H2B Lines: N/A Foley:  N/A Code Status:  full code Last date of multidisciplinary goals of care discussion [pending]  Labs   CBC: Recent Labs  Lab 04/23/21 0104 04/23/21 0136 04/23/21 0216  WBC 12.6*  --   --   NEUTROABS 5.2  --   --   HGB 14.5 15.0 11.6*  HCT 44.4 44.0 34.0*  MCV 87.9  --   --   PLT 245  --   --     Basic Metabolic Panel: Recent Labs  Lab 04/23/21 0104 04/23/21 0136 04/23/21 0216  NA 143 144 144  K 3.5 3.5 2.8*  CL 105 107  --  CO2 26  --   --   GLUCOSE 165* 167*  --   BUN 18 20  --   CREATININE 1.30* 1.30*  --   CALCIUM 9.5  --   --    GFR: Estimated Creatinine Clearance: -2.3 mL/min (A) (by C-G formula based on SCr of 1.3 mg/dL (H)). Recent Labs  Lab 04/23/21 0104 04/23/21 0105  WBC 12.6*  --   LATICACIDVEN  --  3.4*    Liver Function Tests: Recent Labs  Lab 04/23/21 0104  AST 40  ALT 23   ALKPHOS 123  BILITOT 0.4  PROT 7.1  ALBUMIN 4.0   Recent Labs  Lab 04/23/21 0104  LIPASE 116*   No results for input(s): AMMONIA in the last 168 hours.  ABG    Component Value Date/Time   PHART 7.407 04/23/2021 0216   PCO2ART 48.6 (H) 04/23/2021 0216   PO2ART 466 (H) 04/23/2021 0216   HCO3 31.6 (H) 04/23/2021 0216   TCO2 33 (H) 04/23/2021 0216   O2SAT 100.0 04/23/2021 0216     Coagulation Profile: No results for input(s): INR, PROTIME in the last 168 hours.  Cardiac Enzymes: Recent Labs  Lab 04/23/21 0104  CKTOTAL 224    HbA1C: No results found for: HGBA1C  CBG: Recent Labs  Lab 04/23/21 0102  GLUCAP 172*    Review of Systems:   Unable to obtain as patient is intubated and sedated  Past Medical History:  She,  has no past medical history on file.   Surgical History:    Social History:     Unable to obtain as patient is sedated and intubated Family History:  Her family history is not on file.   Allergies Not on File   Home Medications  Prior to Admission medications   Not on File     Total critical care time: 48 minutes  Performed by: [place] care time was exclusive of separately billable procedures and treating other patients.   Critical care was necessary to treat or prevent imminent or life-threatening deterioration.   Critical care was time spent personally by me on the following activities: development of treatment plan with patient and/or surrogate as well as nursing, discussions with consultants, evaluation of patient's response to treatment, examination of patient, obtaining history from patient or surrogate, ordering and performing treatments and interventions, ordering and review of laboratory studies, ordering and review of radiographic studies, pulse oximetry and re-evaluation of patient's condition.   Jacky Kindle MD [place] [facility] Critical [facility] See Amion for pager If no response to pager, please call  [phone] until 7pm After 7pm, Please call E-link [phone]

## 2021-04-23 NOTE — Progress Notes (Signed)
eLink Physician-Brief Progress Note Patient Name: [name] DOB: 09/29/1876 MRN: [id number]   Date of Service  04/23/2021  HPI/Events of Note  New admit. 36s female brought in after apparent polysubstance overdose. Given Narcan by EMS. Was agitated and yelling and vomiting upon arrival in the ED. Patient required intubation for airway protection per ED provider note.  Tox screen positive for cocaine and THC. EtOH, APAP and salicylate levels negative. Normal AG.  Labs notable for probable mild AKI (Cr 1.3), AG = 12 (normal). Post-intubation ABG 7.41/49/466/32/BE +5.0.  On exam, patient is intubated and sedated, synchronous with ventilator which is set to VC 400x18, 5, 60%. HR 53 bpm (SB), BP 108/67 (80), SpO2 100%.   eICU Interventions  # Neuro: - Propofol as needed for sedation - Allow time for opioids to wash out - Psych eval once able to assess for SI  # Resp: - Continue current vent settings. - Will aim to perform SAT/SBT in AM. - Cover with Zosyn for possible aspiration. If remains afebrile likely de-escalate or stop.  # Endocrine: - ISS Q4H  # Renal: - Likely AKI: S/p IVF boluses in ED. Ordered mIVF with LR @ 100 mL/hr.  DVT PPX: Lovenox '40mg'$  [place] Q24H GI PPX: Famotidine BID      Intervention Category Evaluation Type: New Patient Evaluation  [name] 04/23/2021, 4:51 AM

## 2021-04-24 ENCOUNTER — Inpatient Hospital Stay (HOSPITAL_COMMUNITY): Payer: Self-pay

## 2021-04-24 DIAGNOSIS — G9340 Encephalopathy, unspecified: Secondary | ICD-10-CM

## 2021-04-24 LAB — GLUCOSE, CAPILLARY
Glucose-Capillary: 104 mg/dL — ABNORMAL HIGH (ref 70–99)
Glucose-Capillary: 106 mg/dL — ABNORMAL HIGH (ref 70–99)
Glucose-Capillary: 108 mg/dL — ABNORMAL HIGH (ref 70–99)
Glucose-Capillary: 116 mg/dL — ABNORMAL HIGH (ref 70–99)
Glucose-Capillary: 118 mg/dL — ABNORMAL HIGH (ref 70–99)
Glucose-Capillary: 145 mg/dL — ABNORMAL HIGH (ref 70–99)

## 2021-04-24 LAB — BASIC METABOLIC PANEL
Anion gap: 8 (ref 5–15)
BUN: 11 mg/dL (ref 6–20)
CO2: 26 mmol/L (ref 22–32)
Calcium: 9.4 mg/dL (ref 8.9–10.3)
Chloride: 106 mmol/L (ref 98–111)
Creatinine, Ser: 0.85 mg/dL (ref 0.44–1.00)
GFR, Estimated: >60 mL/min (ref >60)
Glucose, Bld: 104 mg/dL — ABNORMAL HIGH (ref 70–99)
Potassium: 3.7 mmol/L (ref 3.5–5.1)
Sodium: 140 mmol/L (ref 135–145)

## 2021-04-24 LAB — CBC
HCT: 40.8 % (ref 36.0–46.0)
Hemoglobin: 13 g/dL (ref 12.0–15.0)
MCH: 28 pg (ref 26.0–34.0)
MCHC: 31.9 g/dL (ref 30.0–36.0)
MCV: 87.7 fL (ref 80.0–100.0)
Platelets: 176 10*3/uL (ref 150–400)
RBC: 4.65 MIL/uL (ref 3.87–5.11)
RDW: 13.8 % (ref 11.5–15.5)
WBC: 15 10*3/uL — ABNORMAL HIGH (ref 4.0–10.5)
nRBC: 0 % (ref 0.0–0.2)

## 2021-04-24 LAB — HEMOGLOBIN A1C
Hgb A1c MFr Bld: 5.9 % — ABNORMAL HIGH (ref 4.8–5.6)
Mean Plasma Glucose: 123 mg/dL

## 2021-04-24 LAB — PHOSPHORUS: Phosphorus: 3.7 mg/dL (ref 2.5–4.6)

## 2021-04-24 LAB — MAGNESIUM: Magnesium: 1.9 mg/dL (ref 1.7–2.4)

## 2021-04-24 LAB — PATHOLOGIST SMEAR REVIEW

## 2021-04-24 MED ORDER — MIRTAZAPINE 30 MG PO TABS
30.0000 mg | ORAL_TABLET | Freq: Every day | ORAL | Status: DC
  Administered 2021-04-24 – 2021-04-25 (×2): 30 mg via ORAL
  Filled 2021-04-24 (×4): qty 1

## 2021-04-24 MED ORDER — HYDROCHLOROTHIAZIDE 25 MG PO TABS
25.0000 mg | ORAL_TABLET | Freq: Every day | ORAL | Status: DC
  Administered 2021-04-24 – 2021-04-26 (×3): 25 mg via ORAL
  Filled 2021-04-24 (×3): qty 1

## 2021-04-24 MED ORDER — FAMOTIDINE 20 MG PO TABS
20.0000 mg | ORAL_TABLET | Freq: Two times a day (BID) | ORAL | Status: DC
  Administered 2021-04-24: 20 mg via ORAL
  Filled 2021-04-24: qty 1

## 2021-04-24 MED ORDER — SODIUM CHLORIDE 0.9 % IV SOLN
3.0000 g | Freq: Four times a day (QID) | INTRAVENOUS | Status: DC
  Administered 2021-04-24 – 2021-04-26 (×8): 3 g via INTRAVENOUS
  Filled 2021-04-24 (×2): qty 8
  Filled 2021-04-24: qty 3
  Filled 2021-04-24 (×2): qty 8
  Filled 2021-04-24: qty 3
  Filled 2021-04-24: qty 8
  Filled 2021-04-24: qty 3
  Filled 2021-04-24 (×2): qty 8

## 2021-04-24 MED ORDER — VITAL AF 1.2 CAL PO LIQD: 1000.0000 mL | ORAL | Status: DC

## 2021-04-24 MED ORDER — SERTRALINE HCL 100 MG PO TABS: 100.0000 mg | ORAL_TABLET | Freq: Every day | ORAL | Status: DC

## 2021-04-24 MED ORDER — ACETAMINOPHEN 325 MG PO TABS
650.0000 mg | ORAL_TABLET | Freq: Four times a day (QID) | ORAL | Status: DC | PRN
  Administered 2021-04-24 – 2021-04-25 (×2): 650 mg via ORAL
  Filled 2021-04-24 (×2): qty 2

## 2021-04-24 MED ORDER — SERTRALINE HCL 50 MG PO TABS: 150.0000 mg | ORAL_TABLET | Freq: Every day | ORAL | Status: DC

## 2021-04-24 MED ORDER — MIRTAZAPINE 15 MG PO TABS: 15.0000 mg | ORAL_TABLET | Freq: Every day | ORAL | Status: DC

## 2021-04-24 MED ORDER — POTASSIUM CHLORIDE 20 MEQ PO PACK
40.0000 meq | PACK | Freq: Once | ORAL | Status: AC
  Administered 2021-04-24: 40 meq
  Filled 2021-04-24: qty 2

## 2021-04-24 MED ORDER — LOSARTAN POTASSIUM 50 MG PO TABS
50.0000 mg | ORAL_TABLET | Freq: Every day | ORAL | Status: DC
  Administered 2021-04-24 – 2021-04-26 (×3): 50 mg via ORAL
  Filled 2021-04-24 (×3): qty 1

## 2021-04-24 NOTE — Consult Note (Addendum)
[facility] Consult   Reason for Consult:  Hx of SI and admitted with OD, of undetermined intent Referring Physician:  Chi Rodman Pickle, MD Patient Identification: Morgan Davenport MRN:  [id number] Principal Diagnosis: <principal problem not specified> Diagnosis:  Active Problems:   Drug overdose, intentional ([place])   Total Time spent with patient: 20 minutes  Subjective:   [name] is a 60 y.o. female patient admitted with cocaine overdose. Patient has a hx of polysubstance use disorder with cocaine and THC,and MDD.   HPI:  Patient was extubated this AM and seen in the PM. On assessment patient is alert and oriented and asking for food. Patient reports she is aware that she overdose, but she is confused as to how this happened and does not recall most of the events surrounding susbtance use and asks where she was found. Patient reports that she did not intend to overdose but she does use approx $20.00 worth of cocaine daily and at least 1 joint of THC/ day as well. Patient also reports that she drinks at least "2, 40's a day." Patient reports that she has been prescribed Zoloft and remeron and they normally help her, but she has been in consistent and probably only takes them 4x/ week. Patient reports she last took them 2 days ago. Patient reports that she sleeps well with the remeron but not without it, she endorses decreased appetite, decreased energy, and decreased concentration over the last few weeks. Patient also reports that she relapsed with her drug use approx 77mo. Ago but reports that nothing really instigated this beyond hanging around other substance users. Patient reported she had been sober for 8 years prior to this. Patient does not screen positive for hx of manic episodes. Patient denies SI, HI, and AVH.   Past Psychiatric History: MDD, Zoloft and Remeron  Risk to Self:  NO Risk to Others:  NO [facility] Prior Outpatient  Therapy:  Yes, Dr. IBeckie Busingcurrently prescribed Zoloft 1575mand Remeron 1569mHS and last say him 01/2021, see q 69mo8moPast Medical History: Per primary, chart will also be merged Family Psychiatric  History: N/a Social History:  Social History   Substance and Sexual Activity  Alcohol Use Not on file     Social History   Substance and Sexual Activity  Drug Use Not on file    Social History   Socioeconomic History   Marital status: Married    Spouse name: Not on file   Number of children: Not on file   Years of education: Not on file   Highest education level: Not on file  Occupational History   Not on file  Tobacco Use   Smoking status: Not on file   Smokeless tobacco: Not on file  Substance and Sexual Activity   Alcohol use: Not on file   Drug use: Not on file   Sexual activity: Not on file  Other Topics Concern   Not on file  Social History Narrative   Not on file   Social Determinants of Health   Financial Resource Strain: Not on file  Food Insecurity: Not on file  Transportation Needs: Not on file  Physical Activity: Not on file  Stress: Not on file  Social Connections: Not on file   Additional Social History:    Allergies:   Allergies  Allergen Reactions   Tramadol Other (See Comments)    unknown    Labs:  Results  for orders placed or performed during the hospital encounter of 04/23/21 (from the past 48 hour(s))  CBG monitoring, ED     Status: Abnormal   Collection Time: 04/23/21  1:02 AM  Result Value Ref Range   Glucose-Capillary 172 (H) 70 - 99 mg/dL    Comment: Glucose reference range applies only to samples taken after fasting for at least 8 hours.  CBC with Differential/Platelet     Status: Abnormal   Collection Time: 04/23/21  1:04 AM  Result Value Ref Range   WBC 12.6 (H) 4.0 - 10.5 K/uL   RBC 5.05 3.87 - 5.11 MIL/uL   Hemoglobin 14.5 12.0 - 15.0 g/dL   HCT 44.4 36.0 - 46.0 %   MCV 87.9 80.0 - 100.0 fL   MCH 28.7 26.0 - 34.0 pg   MCHC  32.7 30.0 - 36.0 g/dL   RDW 13.5 11.5 - 15.5 %   Platelets 245 150 - 400 K/uL   nRBC 0.0 0.0 - 0.2 %   Neutrophils Relative % 41 %   Neutro Abs 5.2 1.7 - 7.7 K/uL   Lymphocytes Relative 55 %   Lymphs Abs 6.9 (H) 0.7 - 4.0 K/uL   Monocytes Relative 3 %   Monocytes Absolute 0.4 0.1 - 1.0 K/uL   Eosinophils Relative 0 %   Eosinophils Absolute 0.0 0.0 - 0.5 K/uL   Basophils Relative 1 %   Basophils Absolute 0.1 0.0 - 0.1 K/uL   WBC Morphology See Note     Comment: LYMPHOCYTOSIS >7.0   RBC Morphology MORPHOLOGY UNREMARKABLE    Abs Immature Granulocytes 0.00 0.00 - 0.07 K/uL    Comment: Performed at [facility], [address], [place], [place] [address]  Comprehensive metabolic panel     Status: Abnormal   Collection Time: 04/23/21  1:04 AM  Result Value Ref Range   Sodium 143 135 - 145 mmol/L   Potassium 3.5 3.5 - 5.1 mmol/L   Chloride 105 98 - 111 mmol/L   CO2 26 22 - 32 mmol/L   Glucose, Bld 165 (H) 70 - 99 mg/dL    Comment: Glucose reference range applies only to samples taken after fasting for at least 8 hours.   BUN 18 6 - 20 mg/dL    Comment: QA FLAGS AND/OR RANGES MODIFIED BY DEMOGRAPHIC UPDATE ON 07/26 AT 1038   Creatinine, Ser 1.30 (H) 0.44 - 1.00 mg/dL   Calcium 9.5 8.9 - 10.3 mg/dL   Total Protein 7.1 6.5 - 8.1 g/dL   Albumin 4.0 3.5 - 5.0 g/dL   AST 40 15 - 41 U/L   ALT 23 0 - 44 U/L   Alkaline Phosphatase 123 38 - 126 U/L   Total Bilirubin 0.4 0.3 - 1.2 mg/dL   GFR, Estimated 28 (L) >60 mL/min    Comment: (NOTE) Calculated using the CKD-EPI Creatinine Equation (2021)    Anion gap 12 5 - 15    Comment: Performed at [facility], [place] [address], [place], [place] [address]  Lipase, blood     Status: Abnormal   Collection Time: 04/23/21  1:04 AM  Result Value Ref Range   Lipase 116 (H) 11 - 51 U/L    Comment: Performed at [facility], [place] [address], [place], [place] [address]  Acetaminophen level     Status: Abnormal   Collection Time:  04/23/21  1:04 AM  Result Value Ref Range   Acetaminophen (Tylenol), Serum <10 (L) 10 - 30  ug/mL    Comment: (NOTE) Therapeutic concentrations vary significantly. A range of 10-30 ug/mL  may be an effective concentration for many patients. However, some  are best treated at concentrations outside of this range. Acetaminophen concentrations >150 ug/mL at 4 hours after ingestion  and >50 ug/mL at 12 hours after ingestion are often associated with  toxic reactions.  Performed at [facility], [place] [address], [place], [place] [address]   Salicylate level     Status: Abnormal   Collection Time: 04/23/21  1:04 AM  Result Value Ref Range   Salicylate Lvl <9.3 (L) 7.0 - 30.0 mg/dL    Comment: Performed at [facility] [address], [place], [place] [address]  Ethanol     Status: None   Collection Time: 04/23/21  1:04 AM  Result Value Ref Range   Alcohol, Ethyl (B) <10 <10 mg/dL    Comment: (NOTE) Lowest detectable limit for serum alcohol is 10 mg/dL.  For medical purposes only. Performed at [facility], [place] [address] [place], [place] [address]   Rapid urine drug screen (hospital performed)     Status: Abnormal   Collection Time: 04/23/21  1:04 AM  Result Value Ref Range   Opiates NONE DETECTED NONE DETECTED   Cocaine POSITIVE (A) NONE DETECTED   Benzodiazepines NONE DETECTED NONE DETECTED   Amphetamines NONE DETECTED NONE DETECTED   Tetrahydrocannabinol POSITIVE (A) NONE DETECTED   Barbiturates NONE DETECTED NONE DETECTED    Comment: (NOTE) DRUG SCREEN FOR MEDICAL PURPOSES ONLY.  IF CONFIRMATION IS NEEDED FOR ANY PURPOSE, NOTIFY LAB WITHIN 5 DAYS.  LOWEST DETECTABLE LIMITS FOR URINE DRUG SCREEN Drug Class                     Cutoff (ng/mL) Amphetamine and metabolites    1000 Barbiturate and metabolites    200 Benzodiazepine                 329 Tricyclics and metabolites     300 Opiates and metabolites        300 Cocaine and metabolites         300 THC                            50 Performed at [facility], [place] [address] [place], [place] [address]   CK     Status: None   Collection Time: 04/23/21  1:04 AM  Result Value Ref Range   Total CK 224 38 - 234 U/L    Comment: Performed at [facility], [place] [address], [place], [place] [address]  Pathologist smear review     Status: None   Collection Time: 04/23/21  1:04 AM  Result Value Ref Range   Path Review      Absolute lymphocytosis with mixed population, consider flow cytometry if persists    Comment: Reviewed by Dawn L. Melina Copa, M.D. 04/23/2021 Performed at [facility], [place] [address], [place], [place] [address]   Lactic acid, plasma     Status: Abnormal   Collection Time: 04/23/21  1:05 AM  Result Value Ref Range   Lactic Acid, Venous 3.4 (HH) 0.5 - 1.9 mmol/L    Comment: CRITICAL RESULT CALLED TO, READ BACK BY AND VERIFIED WITH:  J. MOOREFEILD RN @0202  04/23/21 K. SANDERS Performed at [facility], [place] [address], [place], [place] [address]  Urinalysis, Routine w reflex microscopic     Status: Abnormal   Collection Time: 04/23/21  1:30 AM  Result Value Ref Range   Color, Urine YELLOW YELLOW   APPearance CLEAR CLEAR   Specific Gravity, Urine 1.025 1.005 - 1.030   pH 5.0 5.0 - 8.0   Glucose, UA NEGATIVE NEGATIVE mg/dL   Hgb urine dipstick NEGATIVE NEGATIVE   Bilirubin Urine NEGATIVE NEGATIVE   Ketones, ur NEGATIVE NEGATIVE mg/dL   Protein, ur 30 (A) NEGATIVE mg/dL   Nitrite NEGATIVE NEGATIVE   Leukocytes,Ua NEGATIVE NEGATIVE   RBC / HPF 0-5 0 - 5 RBC/hpf   WBC, UA 0-5 0 - 5 WBC/hpf   Bacteria, UA RARE (A) NONE SEEN   Mucus PRESENT    Hyaline Casts, UA PRESENT     Comment: Performed at [facility] [address], [place], [place] [address]  I-Stat Beta hCG blood, ED ([facility], [facility], [facility] only)     Status: None   Collection Time: 04/23/21  1:35 AM  Result Value Ref Range   I-stat hCG, quantitative <5.0 <5 mIU/mL   Comment 3             Comment:   GEST. AGE      CONC.  (mIU/mL)   <=1 WEEK        5 - 50     2 WEEKS       50 - 500     3 WEEKS       100 - 10,000     4 WEEKS     1,000 - 30,000        FEMALE AND NON-PREGNANT FEMALE:     LESS THAN 5 mIU/mL   I-stat chem 8, ED (not at [facility] or [facility])     Status: Abnormal   Collection Time: 04/23/21  1:36 AM  Result Value Ref Range   Sodium 144 135 - 145 mmol/L   Potassium 3.5 3.5 - 5.1 mmol/L   Chloride 107 98 - 111 mmol/L   BUN 20 8 - 23 mg/dL   Creatinine, Ser 1.30 (H) 0.44 - 1.00 mg/dL   Glucose, Bld 167 (H) 70 - 99 mg/dL    Comment: Glucose reference range applies only to samples taken after fasting for at least 8 hours.   Calcium, Ion 1.06 (L) 1.15 - 1.40 mmol/L   TCO2 29 22 - 32 mmol/L   Hemoglobin 15.0 12.0 - 15.0 g/dL   HCT 44.0 36.0 - 46.0 %  I-STAT, chem 8     Status: Abnormal   Collection Time: 04/23/21  1:36 AM  Result Value Ref Range   Sodium 144 135 - 145 mmol/L   Potassium 3.5 3.5 - 5.1 mmol/L   Chloride 107 98 - 111 mmol/L   BUN 20 6 - 20 mg/dL    Comment: QA FLAGS AND/OR RANGES MODIFIED BY DEMOGRAPHIC UPDATE ON 07/26 AT 1038   Creatinine, Ser 1.30 (H) 0.44 - 1.00 mg/dL   Glucose, Bld 167 (H) 70 - 99 mg/dL    Comment: Glucose reference range applies only to samples taken after fasting for at least 8 hours.   Calcium, Ion 1.06 (L) 1.15 - 1.40 mmol/L   TCO2 29 22 - 32 mmol/L   Hemoglobin 15.0 12.0 - 15.0 g/dL   HCT 44.0 36.0 - 46.0 %  Blood culture (routine x 2)     Status: None (Preliminary result)   Collection Time: 04/23/21  2:02 AM   Specimen: BLOOD RIGHT  HAND  Result Value Ref Range   Specimen Description BLOOD RIGHT HAND    Special Requests      BOTTLES DRAWN AEROBIC AND ANAEROBIC Blood Culture results may not be optimal due to an inadequate volume of blood received in culture bottles   Culture      NO GROWTH 1 DAY Performed at [facility] [address], [place], [place] [address]    Report Status PENDING   Blood culture (routine x  2)     Status: None (Preliminary result)   Collection Time: 04/23/21  2:10 AM   Specimen: BLOOD RIGHT FOREARM  Result Value Ref Range   Specimen Description BLOOD RIGHT FOREARM    Special Requests      BOTTLES DRAWN AEROBIC ONLY Blood Culture results may not be optimal due to an inadequate volume of blood received in culture bottles   Culture      NO GROWTH 1 DAY Performed at [facility], [place] [address], [place], [place] [address]    Report Status PENDING   I-Stat arterial blood gas, [facility] ED)     Status: Abnormal   Collection Time: 04/23/21  2:16 AM  Result Value Ref Range   pH, Arterial 7.407 7.350 - 7.450   pCO2 arterial 48.6 (H) 32.0 - 48.0 mmHg   pO2, Arterial 466 (H) 83.0 - 108.0 mmHg   Bicarbonate 31.6 (H) 20.0 - 28.0 mmol/L   TCO2 33 (H) 22 - 32 mmol/L   O2 Saturation 100.0 %   Acid-Base Excess 5.0 (H) 0.0 - 2.0 mmol/L   Sodium 144 135 - 145 mmol/L   Potassium 2.8 (L) 3.5 - 5.1 mmol/L   Calcium, Ion 1.17 1.15 - 1.40 mmol/L   HCT 34.0 (L) 36.0 - 46.0 %   Hemoglobin 11.6 (L) 12.0 - 15.0 g/dL   Patient temperature 93.3 F    Collection site Radial    Drawn by RT    Sample type ARTERIAL   Hemoglobin A1c     Status: Abnormal   Collection Time: 04/23/21  2:24 AM  Result Value Ref Range   Hgb A1c MFr Bld 5.9 (H) 4.8 - 5.6 %    Comment: (NOTE)         Prediabetes: 5.7 - 6.4         Diabetes: >6.4         Glycemic control for adults with diabetes: <7.0    Mean Plasma Glucose 123 mg/dL    Comment: (NOTE) Performed At: [facility] [place], [place] [id number] Rush Farmer MD EH:[id number]   Resp Panel by RT-PCR (Flu A&B, Covid) Nasopharyngeal Swab     Status: None   Collection Time: 04/23/21  2:41 AM   Specimen: Nasopharyngeal Swab; Nasopharyngeal(NP) swabs in vial transport medium  Result Value Ref Range   SARS Coronavirus 2 by RT PCR NEGATIVE NEGATIVE    Comment: (NOTE) SARS-CoV-2 target nucleic acids are NOT DETECTED.  The SARS-CoV-2  RNA is generally detectable in upper respiratory specimens during the acute phase of infection. The lowest concentration of SARS-CoV-2 viral copies this assay can detect is 138 copies/mL. A negative result does not preclude SARS-Cov-2 infection and should not be used as the sole basis for treatment or other patient management decisions. A negative result may occur with  improper specimen collection/handling, submission of specimen other than nasopharyngeal swab, presence of viral mutation(s) within the areas targeted by this assay, and inadequate number of viral copies(<138 copies/mL). A negative  result must be combined with clinical observations, patient history, and epidemiological information. The expected result is Negative.  Fact Sheet for Patients:  EntrepreneurPulse.com.au  Fact Sheet for Healthcare Providers:  IncredibleEmployment.be  This test is no t yet approved or cleared by the [place] FDA and  has been authorized for detection and/or diagnosis of SARS-CoV-2 by FDA under an Emergency Use Authorization (EUA). This EUA will remain  in effect (meaning this test can be used) for the duration of the COVID-19 declaration under Section 564(b)(1) of the Act, 21 U.S.C.section 360bbb-3(b)(1), unless the authorization is terminated  or revoked sooner.       Influenza A by PCR NEGATIVE NEGATIVE   Influenza B by PCR NEGATIVE NEGATIVE    Comment: (NOTE) The Xpert Xpress SARS-CoV-2/FLU/RSV plus assay is intended as an aid in the diagnosis of influenza from Nasopharyngeal swab specimens and should not be used as a sole basis for treatment. Nasal washings and aspirates are unacceptable for Xpert Xpress SARS-CoV-2/FLU/RSV testing.  Fact Sheet for Patients: EntrepreneurPulse.com.au  Fact Sheet for Healthcare Providers: IncredibleEmployment.be  This test is not yet approved or cleared by the [place]  FDA and has been authorized for detection and/or diagnosis of SARS-CoV-2 by FDA under an Emergency Use Authorization (EUA). This EUA will remain in effect (meaning this test can be used) for the duration of the COVID-19 declaration under Section 564(b)(1) of the Act, 21 U.S.C. section 360bbb-3(b)(1), unless the authorization is terminated or revoked.  Performed at [facility], [place] [address] [place], [place] [address]   Troponin I (High Sensitivity)     Status: Abnormal   Collection Time: 04/23/21  3:00 AM  Result Value Ref Range   Troponin I (High Sensitivity) 33 (H) <18 ng/L    Comment: (NOTE) Elevated high sensitivity troponin I (hsTnI) values and significant  changes across serial measurements may suggest ACS but many other  chronic and acute conditions are known to elevate hsTnI results.  Refer to the "Links" section for chest pain algorithms and additional  guidance. Performed at [facility], [place] [address] [place], [place] [address]   Lactic acid, plasma     Status: None   Collection Time: 04/23/21  3:05 AM  Result Value Ref Range   Lactic Acid, Venous 1.6 0.5 - 1.9 mmol/L    Comment: Performed at [facility] [address] [place], [place] [address]  Glucose, capillary     Status: Abnormal   Collection Time: 04/23/21  4:32 AM  Result Value Ref Range   Glucose-Capillary 113 (H) 70 - 99 mg/dL    Comment: Glucose reference range applies only to samples taken after fasting for at least 8 hours.  MRSA Next Gen by PCR, Nasal     Status: None   Collection Time: 04/23/21  4:44 AM   Specimen: Nasal Mucosa; Nasal Swab  Result Value Ref Range   MRSA by PCR Next Gen NOT DETECTED NOT DETECTED    Comment: (NOTE) The GeneXpert MRSA Assay (FDA approved for NASAL specimens only), is one component of a comprehensive MRSA colonization surveillance program. It is not intended to diagnose MRSA infection nor to guide or monitor treatment for MRSA  infections. Test performance is not FDA approved in patients less than 22 years old. Performed at [facility], [place] [address], [place], [place] [address]   Culture, Respiratory w Gram Stain     Status: None (Preliminary result)   Collection Time: 04/23/21  5:06 AM   Specimen: Tracheal Aspirate; Respiratory  Result Value Ref Range   Specimen Description TRACHEAL ASPIRATE    Special Requests NONE    Gram Stain      FEW WBC PRESENT,BOTH PMN AND MONONUCLEAR RARE GRAM POSITIVE COCCI RARE GRAM POSITIVE RODS    Culture      CULTURE REINCUBATED FOR BETTER GROWTH Performed at [facility], [place] [address] [place], [place] [address]    Report Status PENDING   Troponin I (High Sensitivity)     Status: Abnormal   Collection Time: 04/23/21  5:50 AM  Result Value Ref Range   Troponin I (High Sensitivity) 84 (H) <18 ng/L    Comment: RESULT CALLED TO, READ BACK BY AND VERIFIED WITH:  Lorenza Chick RN @0653  04/23/21 K. SANDERS (NOTE) Elevated high sensitivity troponin I (hsTnI) values and significant  changes across serial measurements may suggest ACS but many other  chronic and acute conditions are known to elevate hsTnI results.  Refer to the Links section for chest pain algorithms and additional  guidance. Performed at [facility], [place] [address] [place], [place] [address]   Basic metabolic panel     Status: Abnormal   Collection Time: 04/23/21  5:50 AM  Result Value Ref Range   Sodium 142 135 - 145 mmol/L   Potassium 3.5 3.5 - 5.1 mmol/L   Chloride 108 98 - 111 mmol/L   CO2 30 22 - 32 mmol/L   Glucose, Bld 107 (H) 70 - 99 mg/dL    Comment: Glucose reference range applies only to samples taken after fasting for at least 8 hours.   BUN 15 6 - 20 mg/dL    Comment: QA FLAGS AND/OR RANGES MODIFIED BY DEMOGRAPHIC UPDATE ON 07/26 AT 1038   Creatinine, Ser 0.90 0.44 - 1.00 mg/dL   Calcium 8.2 (L) 8.9 - 10.3 mg/dL   GFR, Estimated 43 (L) >60 mL/min    Comment:  (NOTE) Calculated using the CKD-EPI Creatinine Equation (2021)    Anion gap 4 (L) 5 - 15    Comment: Performed at [facility] [address] [place], [place] [address]  Triglycerides     Status: None   Collection Time: 04/23/21  5:50 AM  Result Value Ref Range   Triglycerides 52 <150 mg/dL    Comment: Performed at [facility] [address], [place], [place] [address]  Glucose, capillary     Status: Abnormal   Collection Time: 04/23/21  7:12 AM  Result Value Ref Range   Glucose-Capillary 123 (H) 70 - 99 mg/dL    Comment: Glucose reference range applies only to samples taken after fasting for at least 8 hours.  Glucose, capillary     Status: Abnormal   Collection Time: 04/23/21 11:18 AM  Result Value Ref Range   Glucose-Capillary 112 (H) 70 - 99 mg/dL    Comment: Glucose reference range applies only to samples taken after fasting for at least 8 hours.  Basic metabolic panel     Status: Abnormal   Collection Time: 04/23/21 11:26 AM  Result Value Ref Range   Sodium 140 135 - 145 mmol/L   Potassium 4.2 3.5 - 5.1 mmol/L   Chloride 110 98 - 111 mmol/L   CO2 28 22 - 32 mmol/L   Glucose, Bld 110 (H) 70 - 99 mg/dL    Comment: Glucose reference range applies only to samples taken after fasting for at least 8 hours.  BUN 16 6 - 20 mg/dL   Creatinine, Ser 1.04 (H) 0.44 - 1.00 mg/dL   Calcium 8.8 (L) 8.9 - 10.3 mg/dL   GFR, Estimated >60 >60 mL/min    Comment: (NOTE) Calculated using the CKD-EPI Creatinine Equation (2021)    Anion gap 2 (L) 5 - 15    Comment: REPEATED TO VERIFY Performed at [facility] [address] [place], [place] [address]   Troponin I (High Sensitivity)     Status: Abnormal   Collection Time: 04/23/21 11:26 AM  Result Value Ref Range   Troponin I (High Sensitivity) 71 (H) <18 ng/L    Comment: DELTA CHECK NOTED (NOTE) Elevated high sensitivity troponin I (hsTnI) values and significant  changes across serial measurements may suggest ACS  but many other  chronic and acute conditions are known to elevate hsTnI results.  Refer to the Links section for chest pain algorithms and additional  guidance. Performed at [facility], [place] [address] [place], [place] [address]   Basic metabolic panel     Status: Abnormal   Collection Time: 04/23/21  2:48 PM  Result Value Ref Range   Sodium 138 135 - 145 mmol/L   Potassium 4.6 3.5 - 5.1 mmol/L   Chloride 108 98 - 111 mmol/L   CO2 24 22 - 32 mmol/L   Glucose, Bld 80 70 - 99 mg/dL    Comment: Glucose reference range applies only to samples taken after fasting for at least 8 hours.   BUN 16 6 - 20 mg/dL   Creatinine, Ser 0.93 0.44 - 1.00 mg/dL   Calcium 8.8 (L) 8.9 - 10.3 mg/dL   GFR, Estimated >60 >60 mL/min    Comment: (NOTE) Calculated using the CKD-EPI Creatinine Equation (2021)    Anion gap 6 5 - 15    Comment: Performed at [facility] [address] [place], [place] [address]  Troponin I (High Sensitivity)     Status: Abnormal   Collection Time: 04/23/21  2:48 PM  Result Value Ref Range   Troponin I (High Sensitivity) 32 (H) <18 ng/L    Comment: DELTA CHECK NOTED (NOTE) Elevated high sensitivity troponin I (hsTnI) values and significant  changes across serial measurements may suggest ACS but many other  chronic and acute conditions are known to elevate hsTnI results.  Refer to the Links section for chest pain algorithms and additional  guidance. Performed at [facility], [place] [address] [place], [place] [address]   Glucose, capillary     Status: Abnormal   Collection Time: 04/23/21  3:11 PM  Result Value Ref Range   Glucose-Capillary 106 (H) 70 - 99 mg/dL    Comment: Glucose reference range applies only to samples taken after fasting for at least 8 hours.  Glucose, capillary     Status: Abnormal   Collection Time: 04/23/21  7:26 PM  Result Value Ref Range   Glucose-Capillary 108 (H) 70 - 99 mg/dL    Comment: Glucose reference range applies  only to samples taken after fasting for at least 8 hours.  Glucose, capillary     Status: Abnormal   Collection Time: 04/23/21  8:53 PM  Result Value Ref Range   Glucose-Capillary 119 (H) 70 - 99 mg/dL    Comment: Glucose reference range applies only to samples taken after fasting for at least 8 hours.  Glucose, capillary     Status: Abnormal   Collection Time: 04/24/21 12:11 AM  Result  Value Ref Range   Glucose-Capillary 104 (H) 70 - 99 mg/dL    Comment: Glucose reference range applies only to samples taken after fasting for at least 8 hours.  CBC     Status: Abnormal   Collection Time: 04/24/21  3:15 AM  Result Value Ref Range   WBC 15.0 (H) 4.0 - 10.5 K/uL   RBC 4.65 3.87 - 5.11 MIL/uL   Hemoglobin 13.0 12.0 - 15.0 g/dL   HCT 40.8 36.0 - 46.0 %   MCV 87.7 80.0 - 100.0 fL   MCH 28.0 26.0 - 34.0 pg   MCHC 31.9 30.0 - 36.0 g/dL   RDW 13.8 11.5 - 15.5 %   Platelets 176 150 - 400 K/uL   nRBC 0.0 0.0 - 0.2 %    Comment: Performed at [facility], [place] [address] [place], [place] [address]  Basic metabolic panel     Status: Abnormal   Collection Time: 04/24/21  3:15 AM  Result Value Ref Range   Sodium 140 135 - 145 mmol/L   Potassium 3.7 3.5 - 5.1 mmol/L   Chloride 106 98 - 111 mmol/L   CO2 26 22 - 32 mmol/L   Glucose, Bld 104 (H) 70 - 99 mg/dL    Comment: Glucose reference range applies only to samples taken after fasting for at least 8 hours.   BUN 11 6 - 20 mg/dL   Creatinine, Ser 0.85 0.44 - 1.00 mg/dL   Calcium 9.4 8.9 - 10.3 mg/dL   GFR, Estimated >60 >60 mL/min    Comment: (NOTE) Calculated using the CKD-EPI Creatinine Equation (2021)    Anion gap 8 5 - 15    Comment: Performed at [place] [address] [place], [place] [address]  Glucose, capillary     Status: Abnormal   Collection Time: 04/24/21  3:24 AM  Result Value Ref Range   Glucose-Capillary 106 (H) 70 - 99 mg/dL    Comment: Glucose reference range applies only to samples taken after  fasting for at least 8 hours.  Glucose, capillary     Status: Abnormal   Collection Time: 04/24/21  7:15 AM  Result Value Ref Range   Glucose-Capillary 118 (H) 70 - 99 mg/dL    Comment: Glucose reference range applies only to samples taken after fasting for at least 8 hours.  Glucose, capillary     Status: Abnormal   Collection Time: 04/24/21 11:00 AM  Result Value Ref Range   Glucose-Capillary 108 (H) 70 - 99 mg/dL    Comment: Glucose reference range applies only to samples taken after fasting for at least 8 hours.  Glucose, capillary     Status: Abnormal   Collection Time: 04/24/21  3:35 PM  Result Value Ref Range   Glucose-Capillary 116 (H) 70 - 99 mg/dL    Comment: Glucose reference range applies only to samples taken after fasting for at least 8 hours.    Current Facility-Administered Medications  Medication Dose Route Frequency Provider Last Rate Last Admin   0.9 %  sodium chloride infusion   Intravenous PRN Margaretha Seeds, MD   Paused at 04/24/21 0749   acetaminophen (TYLENOL) tablet 650 mg  650 mg Oral Q6H PRN Iona Beard, MD   650 mg at 04/24/21 1541   Ampicillin-Sulbactam (UNASYN) 3 g in sodium chloride 0.9 % 100 mL IVPB  3 g Intravenous Q6H Wise, Nason S, RPH       chlorhexidine gluconate (MEDLINE KIT) (PERIDEX) 0.12 %  solution 15 mL  15 mL Mouth Rinse BID Margaretha Seeds, MD   15 mL at 04/24/21 0750   Chlorhexidine Gluconate Cloth 2 % PADS 6 each  6 each Topical Daily Margaretha Seeds, MD   6 each at 04/24/21 1156   docusate (COLACE) 50 MG/5ML liquid 100 mg  100 mg Per Tube BID PRN Jacky Kindle, MD       enoxaparin (LOVENOX) injection 40 mg  40 mg Subcutaneous Q24H Jacky Kindle, MD   40 mg at 04/23/21 1500   hydrALAZINE (APRESOLINE) injection 10-40 mg  10-40 mg Intravenous Q4H PRN Erick Colace, NP   20 mg at 04/24/21 0539   hydrochlorothiazide (HYDRODIURIL) tablet 25 mg  25 mg Oral Daily Margaretha Seeds, MD   25 mg at 04/24/21 1541   insulin aspart (novoLOG)  injection 0-9 Units  0-9 Units Subcutaneous Q4H Jacky Kindle, MD   1 Units at 04/23/21 0753   losartan (COZAAR) tablet 50 mg  50 mg Oral Daily Margaretha Seeds, MD       MEDLINE mouth rinse  15 mL Mouth Rinse 10 times per day Margaretha Seeds, MD   15 mL at 04/24/21 1000   polyethylene glycol (MIRALAX / GLYCOLAX) packet 17 g  17 g Oral Daily PRN Jacky Kindle, MD        Musculoskeletal: Strength & Muscle Tone: within normal limits Gait & Station:  remains in bed on exam Patient leans: N/A            Psychiatric Specialty Exam:  Presentation  General Appearance: Appropriate for Environment  Eye Contact:Good  Speech:Clear and Coherent  Speech Volume:Normal  Handedness: No data recorded  Mood and Affect  Mood:Euthymic  Affect:Congruent   Thought Process  Thought Processes:Coherent  Descriptions of Associations:Intact  Orientation:Full (Time, Place and Person)  Thought Content:Logical  History of Schizophrenia/Schizoaffective disorder:No data recorded Duration of Psychotic Symptoms:No data recorded Hallucinations:Hallucinations: None  Ideas of Reference:None  Suicidal Thoughts:Suicidal Thoughts: No  Homicidal Thoughts:Homicidal Thoughts: No   Sensorium  Memory:Immediate Good; Recent Poor; Remote Fair  Judgment:Fair  Insight:Shallow   Executive Functions  Concentration:Good  Attention Span:Good  [place]  Language:Good   Psychomotor Activity  Psychomotor Activity:Psychomotor Activity: Normal   Assets  Assets:Resilience; Armed forces logistics/support/administrative officer; Housing; Intimacy; Social Support   Sleep  Sleep:Sleep: Fair   Physical Exam: Physical Exam Constitutional:      Appearance: Normal appearance.  HENT:     Head: Normocephalic and atraumatic.  Eyes:     Extraocular Movements: Extraocular movements intact.     Conjunctiva/sclera: Conjunctivae normal.  Cardiovascular:     Rate and Rhythm: Normal rate.   Pulmonary:     Effort: Pulmonary effort is normal.     Breath sounds: Normal breath sounds.  Abdominal:     General: Abdomen is flat.  Musculoskeletal:        General: Normal range of motion.  Skin:    General: Skin is warm and dry.  Neurological:     Mental Status: She is alert and oriented to person, place, and time.   Review of Systems  Constitutional:  Negative for chills and fever.  HENT:  Negative for hearing loss.   Eyes:  Negative for blurred vision.  Respiratory:  Negative for cough and wheezing.   Cardiovascular:  Negative for chest pain.  Gastrointestinal:  Negative for abdominal pain.  Neurological:  Negative for dizziness.  Psychiatric/Behavioral:  Positive for substance abuse. Negative for hallucinations and  suicidal ideas.   Blood pressure (!) 144/73, pulse 76, temperature 98.6 F (37 C), temperature source Oral, resp. rate 15, height 5' 2"  (1.575 m), weight 47.7 kg, SpO2 97 %. Body mass index is 19.23 kg/m.  Treatment Plan Summary: Medication management Unintentional Drug OD Substance induced mood disorder Patient appears to be at her baseline and made it clear that she did not intend to OD or wish to kill herself. Patient did not wish to seek rehab but noted that she is aware that she needs to discontinue use of illicit substances and her [facility] use. On admission EtoH was neg but UDS was positive. Patient reports feeling more controlled when she takes her medication unfortunately she is not consistent enough to determine if her mood is adequately being treated.  - Restart home Zoloft 167m - Restart Home Remeron 383mQHS - Recommend d'c sitter  Will reasses in AM. Disposition: No evidence of imminent risk to self or others at present.   Follow up with her OP provider.  PGY-2 JaFreida BusmanMD 04/24/2021 3:46 PM

## 2021-04-24 NOTE — Progress Notes (Signed)
[facility] ADULT ICU REPLACEMENT PROTOCOL   The patient does apply for the [facility] Adult ICU Electrolyte Replacment Protocol based on the criteria listed below:   1.Exclusion criteria: TCTS patients, ECMO patients and Hypothermia Protocol, and   Dialysis patients 2. Is GFR >/= 30 ml/min? Yes.    Patient's GFR today is >60 3. Is SCr </= 2? Yes.   Patient's SCr is 0.85 mg/dL 4. Did SCr increase >/= 0.5 in 24 hours? No. 5.Pt's weight >40kg  Yes.   6. Abnormal electrolyte(s): K+ 3.7  7. Electrolytes replaced per protocol 8.  Call MD STAT for K+ </= 2.5, Phos </= 1, or Mag </= 1 Physician:  n/a   Morgan Davenport 04/24/2021 4:57 AM

## 2021-04-24 NOTE — Plan of Care (Signed)

## 2021-04-24 NOTE — Procedures (Signed)
Extubation Procedure Note  Patient Details:   Name: Morgan Davenport DOB: 05/03/1961 MRN: [id number]   Airway Documentation:    Vent end date: 04/24/21 Vent end time: 0850   Evaluation  O2 sats: stable throughout Complications: No apparent complications Patient did tolerate procedure well. Bilateral Breath Sounds: Clear, Diminished   Yes  Patient was extubated to a 4L [place] without any complications, dyspnea or stridor noted.   Morgan Davenport, Eddie North 04/24/2021, 8:50 AM

## 2021-04-24 NOTE — Progress Notes (Signed)
Pharmacy Antibiotic Note  Morgan Davenport is a 60 y.o. female admitted on 04/23/2021 with  suspected aspiration pneumonia .  Pharmacy has been consulted for Unasyn dosing.  Patient condition improving. After discussion with PCCM doctor, will narrow from Zosyn to Unasyn. Renal function improving since admission. Sputum culture from 7/26 remains pending.  Plan: Start Unasyn 3g every 6 hours  Height: '5\' 2"'$  (157.5 cm) Weight: 47.7 kg (105 lb 2.6 oz) IBW/kg (Calculated) : 50.1  Temp (24hrs), Avg:98.2 F (36.8 C), Min:97.34 F (36.3 C), Max:99.7 F (37.6 C)  Recent Labs  Lab 04/23/21 0104 04/23/21 0105 04/23/21 0136 04/23/21 0305 04/23/21 0550 04/23/21 1126 04/23/21 1448 04/24/21 0315  WBC 12.6*  --   --   --   --   --   --  15.0*  CREATININE 1.30*  --  1.30*  1.30*  --  0.90 1.04* 0.93 0.85  LATICACIDVEN  --  3.4*  --  1.6  --   --   --   --     Estimated Creatinine Clearance: 53 mL/min (by C-G formula based on SCr of 0.85 mg/dL).    Not on File  Antimicrobials this admission: Piperacillin/tazobactam 7/26 >> 7/27  Microbiology results: 7/26 BCx: NGTD 7/26 Sputum: pending  7/26 MRSA PCR: negative 7/26 COVID/FLU: negative  Thank you for allowing pharmacy to participate in this patient's care.  Reatha Harps, PharmD PGY1 Pharmacy Resident 04/24/2021 8:43 AM Check [email or web address] for unit specific pharmacy number

## 2021-04-24 NOTE — Progress Notes (Signed)
[place] Progress Note Patient Name: Morgan Davenport DOB: May 22, 1961 MRN: [id number]   Date of Service  04/24/2021  HPI/Events of Note  Extreme agitation - Patient trying to get OOB. Nursing request for safety sitter.   eICU Interventions  Plan: Will Sport and exercise psychologist.       Intervention Category Major Interventions: Delirium, psychosis, severe agitation - evaluation and management  Sameer Teeple Eugene 04/24/2021, 3:31 AM

## 2021-04-24 NOTE — Progress Notes (Signed)
SLP Cancellation Note  Patient Details Name: Morgan Davenport MRN: [id number] DOB: [birth year]   Cancelled treatment:       Reason Eval/Treat Not Completed: SLP screened, no needs identified, will sign off  Case discussed with pt's RN who indicated that the pt passed [facility] swallow screen and a regular texture diet has been ordered; therefore, no formalized SLP swallow eval is needed per protocol. Will sign off.    Vanette Noguchi I. Hardin Negus, [place], [place] Office number [phone] Pager [place] 04/24/2021, 4:49 PM

## 2021-04-24 NOTE — Progress Notes (Addendum)
Patient ID: Morgan Davenport, female   DOB: [birth year], 60 y.o.   MRN: [id number]   NAME:  Morgan Davenport, MRN:  [id number], DOB:  [birth year], LOS: 1 ADMISSION DATE:  04/23/2021, CONSULTATION DATE: 04/23/2021 REFERRING MD:  Ezequiel Essex, MD , CHIEF COMPLAINT: Found unresponsive  History of Present Illness:  Patient is deeply sedated and intubated so most of the history is taken from ED/EMS note  Middle-aged unknown female who was brought into the emergency department from a motel, after EMS was notified of apparent overdose.  Patient was found obtunded and apneic, she was given IM and IV Narcan with no initial response.  In the emergency department she started screaming, agitated and vomited multiple times with visible aspiration into airway, requiring endotracheal intubation to protect airway.  Person who called EMS did not stick around.  Patient is noted to be hypothermic with temperature 70F, placed on warming blanket  Pertinent  Medical History  Unknown past medical history  Significant Hospital Events: Including procedures, antibiotic start and stop dates in addition to other pertinent events   7/26 admitted and intubated 07/27 weaned from sedation and extubated   Interim History / Subjective:  Weaned from sedation and extubated today.   Objective   Blood pressure (!) 164/73, pulse 65, temperature 98.42 F (36.9 C), resp. rate 18, height '5\' 2"'$  (1.575 m), weight 47.7 kg, SpO2 100 %.    Vent Mode: PRVC FiO2 (%):  [40 %] 40 % Set Rate:  [18 bmp] 18 bmp Vt Set:  [100 mL-400 mL] 400 mL PEEP:  [5 cmH20] 5 cmH20 Pressure Support:  [12 cmH20] 12 cmH20 Plateau Pressure:  [12 cmH20-14 cmH20] 13 cmH20   Intake/Output Summary (Last 24 hours) at 04/24/2021 0704 Last data filed at 04/23/2021 1800 Gross per 24 hour  Intake 860.55 ml  Output 725 ml  Net 135.55 ml   Filed Weights   04/23/21 0057 04/23/21 0500  Weight: 49.9 kg 47.7 kg    Examination:   Physical exam: General:   ill-appearing, middle-aged African-American female, in NAD HEENT: No scleral icterus. Whitney, AT.  Neuro: PEERL. Following commands. Able to hold all 4 extremities against gravity when asked.  Awake and alert.  Chest: coarse breath sounds bilaterally, but improved from yesterday's exam  Heart: Regular rate and rhythm, no murmurs or gallops Abdomen: Soft, nontender, nondistended, bowel sounds present   Resolved Hospital Problem list   Hypothermia Lactic Acidosis  Elevated troponins  Anemia   Assessment & Plan:   Acute toxic encephalopathy likely secondary to drug overdose -  improving, patient awake and agitated overnight. Now awake and following commands.   Acute hypoxic/hypercapnic respiratory failure in the setting of apparent drug overdose - improving  - extubated this morning - currently on 2 L [place] - weans to room air as appropriate  - D/C sedation and mechanical ventilation   Leukocytosis  Probable aspiration pneumonia - witnessed aspiration in the ED and rare Gram positive rods and cocci seen on Gram stain. Initial CXR negative. Repeat CXR shows increased lung markings compared to prior, awaiting final read. WBC count increased at 12.6 on admission and up-trending. Now at 15.6.  - continue IV zosyn, will adjust when cultures get back   - f/u cultures  - blood cx: no growth at <12 hr - resp cx: pending, re-incubated for better growth; rare G+ rods and cocci on gram stain  - daily CBC   Elevated Troponins - resolved. - troponin elevated on admission to 33, peak  troponin= 84, now down trended to 32; likely demand ischemia in setting of cocaine use  Hypertension - continue hydralazine Q4H PRN  - restart home losartan and HCTZ  Anemia 2/2 fluid resuscitation-  resolved - Hgb dropped from 15.0 to 11.6, likely dilutional after fluid resuscitation  - Hgb now normalized to 13.6  Hypokalemia - Resolved with repletion - Serum K+ at 2.8 on admission, now normalized 3.7 - daily  BMET  Hypocalcemia -  resolved - serum Ca2+ 8.2, but ionized was 1.06. Now normalized to 9.4. - trend daily BMET   Behavioral Health issues -  Hx of mental health problems, seen by psychiatry, off medication.  Polysubstance abuse - UDS positive for cocaine and THC - psychiatric follow up outpatient   Best Practice (right click and "Reselect all SmartList Selections" daily)   Diet/type:  consult to speech for swallow study and diet orders DVT prophylaxis: LMWH GI prophylaxis: H2B Lines: N/A Foley:  N/A Code Status:  full code Last date of multidisciplinary goals of care discussion [pending]  Labs   CBC: Recent Labs  Lab 04/23/21 0104 04/23/21 0136 04/23/21 0216 04/24/21 0315  WBC 12.6*  --   --  15.0*  NEUTROABS 5.2  --   --   --   HGB 14.5 15.0  15.0 11.6* 13.0  HCT 44.4 44.0  44.0 34.0* 40.8  MCV 87.9  --   --  87.7  PLT 245  --   --  0000000    Basic Metabolic Panel: Recent Labs  Lab 04/23/21 0104 04/23/21 0136 04/23/21 0216 04/23/21 0550 04/23/21 1126 04/23/21 1448 04/24/21 0315  NA 143 144  144 144 142 140 138 140  K 3.5 3.5  3.5 2.8* 3.5 4.2 4.6 3.7  CL 105 107  107  --  108 110 108 106  CO2 26  --   --  '30 28 24 26  '$ GLUCOSE 165* 167*  167*  --  107* 110* 80 104*  BUN '18 20  20  '$ --  '15 16 16 11  '$ CREATININE 1.30* 1.30*  1.30*  --  0.90 1.04* 0.93 0.85  CALCIUM 9.5  --   --  8.2* 8.8* 8.8* 9.4   GFR: Estimated Creatinine Clearance: 53 mL/min (by C-G formula based on SCr of 0.85 mg/dL). Recent Labs  Lab 04/23/21 0104 04/23/21 0105 04/23/21 0305 04/24/21 0315  WBC 12.6*  --   --  15.0*  LATICACIDVEN  --  3.4* 1.6  --     Liver Function Tests: Recent Labs  Lab 04/23/21 0104  AST 40  ALT 23  ALKPHOS 123  BILITOT 0.4  PROT 7.1  ALBUMIN 4.0   Recent Labs  Lab 04/23/21 0104  LIPASE 116*   No results for input(s): AMMONIA in the last 168 hours.  ABG    Component Value Date/Time   PHART 7.407 04/23/2021 0216   PCO2ART 48.6 (H)  04/23/2021 0216   PO2ART 466 (H) 04/23/2021 0216   HCO3 31.6 (H) 04/23/2021 0216   TCO2 33 (H) 04/23/2021 0216   O2SAT 100.0 04/23/2021 0216     Coagulation Profile: No results for input(s): INR, PROTIME in the last 168 hours.  Cardiac Enzymes: Recent Labs  Lab 04/23/21 0104  CKTOTAL 224    HbA1C: No results found for: HGBA1C  CBG: Recent Labs  Lab 04/23/21 1511 04/23/21 1926 04/23/21 2053 04/24/21 0011 04/24/21 0324  GLUCAP 106* 108* 119* 104* 106*   I have taken an interval history, reviewed the chart  and examined the patient. I agree with the Advanced Practitioner's note, impression, and recommendations as outlined.   Briefly, 61 year old female with polysubstance abuse admitted for drug overdose. Intubated in ED for agitation and altered mental status. Witnessed aspiration.   Passed SBT and extubated  Blood pressure (!) 156/76, pulse 81, temperature 98.6 F (37 C), temperature source Oral, resp. rate 20, height '5\' 2"'$  (1.575 m), weight 47.7 kg, SpO2 97 %. On exam chronically ill appearing and thin female. AAOx2. Follows commands. Moves extremities x 4. Lungs CTAB, no wheezing. Heart RRR, no murmur.  CT head 7/26 - neg CXR 7/26 - no acute infiltrate, effusion or edema  Assessment/Plan  Acute hypoxemic respiratory failure secondary aspiration pneumonitis/pneumonia Acute toxic encephalopathy - UDS +cocaine, cannabis AKI - resolved Hypertension  Critically ill female requiring mechanical ventilation for aspiration pneumonia in setting of drug overdose. This morning, she passed SBT and extubated without complication. Monitor respiratory status. De-escalate antibiotics for aspiration coverage. Restart home anti-hypertensive agents. Regarding her polysubstance abuse, I spoke with husband and he expressed that her drug abuse has led to significant financial and emotional hardship in his life and requesting any assistance. He is considering cutting off ties if her behavior  does not get addressed. We will contact Psychiatry and SW for substance abuse resources. If she remains stable later today, will plan to transfer to floor tomorrow  Critical Care time 42 min  Rodman Pickle, M.D. [facility] Pulmonary/Critical Care Medicine 04/24/2021 1:36 PM   Please see Amion for pager number to reach on-call Pulmonary and Critical Care Team.

## 2021-04-25 LAB — GLUCOSE, CAPILLARY
Glucose-Capillary: 103 mg/dL — ABNORMAL HIGH (ref 70–99)
Glucose-Capillary: 151 mg/dL — ABNORMAL HIGH (ref 70–99)
Glucose-Capillary: 166 mg/dL — ABNORMAL HIGH (ref 70–99)
Glucose-Capillary: 75 mg/dL (ref 70–99)
Glucose-Capillary: 76 mg/dL (ref 70–99)
Glucose-Capillary: 88 mg/dL (ref 70–99)
Glucose-Capillary: 99 mg/dL (ref 70–99)

## 2021-04-25 LAB — BASIC METABOLIC PANEL
Anion gap: 5 (ref 5–15)
BUN: 15 mg/dL (ref 6–20)
CO2: 28 mmol/L (ref 22–32)
Calcium: 8.8 mg/dL — ABNORMAL LOW (ref 8.9–10.3)
Chloride: 104 mmol/L (ref 98–111)
Creatinine, Ser: 1.29 mg/dL — ABNORMAL HIGH (ref 0.44–1.00)
GFR, Estimated: 48 mL/min — ABNORMAL LOW (ref >60)
Glucose, Bld: 125 mg/dL — ABNORMAL HIGH (ref 70–99)
Potassium: 3.3 mmol/L — ABNORMAL LOW (ref 3.5–5.1)
Sodium: 137 mmol/L (ref 135–145)

## 2021-04-25 LAB — CBC
HCT: 41.7 % (ref 36.0–46.0)
Hemoglobin: 13.7 g/dL (ref 12.0–15.0)
MCH: 28.2 pg (ref 26.0–34.0)
MCHC: 32.9 g/dL (ref 30.0–36.0)
MCV: 85.8 fL (ref 80.0–100.0)
Platelets: 218 10*3/uL (ref 150–400)
RBC: 4.86 MIL/uL (ref 3.87–5.11)
RDW: 13.4 % (ref 11.5–15.5)
WBC: 11.8 10*3/uL — ABNORMAL HIGH (ref 4.0–10.5)
nRBC: 0 % (ref 0.0–0.2)

## 2021-04-25 LAB — CULTURE, RESPIRATORY W GRAM STAIN: Culture: NORMAL

## 2021-04-25 MED ORDER — POTASSIUM CHLORIDE 10 MEQ/100ML IV SOLN
10.0000 meq | INTRAVENOUS | Status: DC
Start: 2021-04-25 — End: 2021-04-26
  Administered 2021-04-25 (×4): 10 meq via INTRAVENOUS
  Filled 2021-04-25: qty 100

## 2021-04-25 MED ORDER — POTASSIUM CHLORIDE CRYS ER 20 MEQ PO TBCR
20.0000 meq | EXTENDED_RELEASE_TABLET | ORAL | Status: AC
  Administered 2021-04-25 (×2): 20 meq via ORAL
  Filled 2021-04-25 (×2): qty 1

## 2021-04-25 MED ORDER — SERTRALINE HCL 100 MG PO TABS
100.0000 mg | ORAL_TABLET | Freq: Every day | ORAL | Status: DC
  Administered 2021-04-25 – 2021-04-26 (×2): 100 mg via ORAL
  Filled 2021-04-25 (×2): qty 1

## 2021-04-25 MED ORDER — AMLODIPINE BESYLATE 5 MG PO TABS: 5.0000 mg | ORAL_TABLET | Freq: Every day | ORAL | Status: DC

## 2021-04-25 NOTE — Progress Notes (Signed)
Received as Med Surg pt via w/c from 48M. Oriented to room/unit. Call bell in reach. Currently ordering supper tray. No further needs expressed.

## 2021-04-25 NOTE — Progress Notes (Signed)
[facility] ADULT ICU REPLACEMENT PROTOCOL   The patient does apply for the [facility] Adult ICU Electrolyte Replacment Protocol based on the criteria listed below:   1.Exclusion criteria: TCTS patients, ECMO patients and Hypothermia Protocol, and   Dialysis patients 2. Is GFR >/= 30 ml/min? Yes.    Patient's GFR today is >60 3. Is SCr </= 2? No. Patient's SCr is 1.29 mg/dL 4. Did SCr increase >/= 0.5 in 24 hours? No. 5.Pt's weight >40kg  Yes.   6. Abnormal electrolyte(s): K+ 3.3  7. Electrolytes replaced per protocol 8.  Call MD STAT for K+ </= 2.5, Phos </= 1, or Mag </= 1 Physician:  n/a  Darlys Gales 04/25/2021 4:25 AM

## 2021-04-25 NOTE — Progress Notes (Addendum)
PROGRESS NOTE    Morgan Davenport  [id number] DOB: 1961-07-30 DOA: 04/23/2021 PCP: No primary care provider on file.   Brief Narrative: 60 year old who was ided from a motel, after EMS was notified of apparent overdose.  Patient was found obtunded and apneic, she was given intramuscular and IV Narcan with no initial response.  In the emergency department she started screaming, agitated and vomited multiple times with visible aspiration into airway, requiring endotracheal intubation to protect airway. Patient was intubated on admission 7/26. She was  wean off from sedation and extubated on 7/27.    Assessment & Plan:   Active Problems:   Drug overdose, intentional ([place])  1-Acute toxic encephalopathy likely secondary to drug overdose: Patient  alert and oriented x3.  Back to baseline.  2-Acute hypoxic/hypercapnic respiratory failure in the setting of apparent of unintentional drug overdose: Aspiration pneumonia Extubated 7/27. She is currently on room air. Continue with IV Unasyn to cover for pneumonia WBC trendig down.   3-Elevation of troponin: Mild, in the setting of demand ischemia in the setting of cocaine use  4-Hypertension: Resume losartan and hydrochlorothiazide, continue with as needed hydralazine Anemia secondary to fluid resuscitation resolved hemodilution  Hypokalemia: Replete IV and orally Hypocalcemia: resolved Polysubstance abuse, behavioral health issues: Unintentional Drug overdose.  Psychiatric consulted and patient has been cleared by psych.  Patient would like to follow-up with outpatient, for drug rehab restarted on Zoloft and Remeron  Estimated body mass index is 19.23 kg/m as calculated from the following:   Height as of this encounter: '5\' 2"'$  (1.575 m).   Weight as of this encounter: 47.7 kg.   DVT prophylaxis: Lovenox Code Status: Full code Family Communication: care discusse with patient.  Disposition Plan:  Status is:  Inpatient  Remains inpatient appropriate because:IV treatments appropriate due to intensity of illness or inability to take PO  Dispo: The patient is from: Home              Anticipated d/c is to: Home              Patient currently is not medically stable to d/c.   Difficult to place patient No        Consultants:  CCM  Procedures:  Intubation 7/26--extubated /7/27.  Antimicrobials:    Subjective: She is alert, conversant, denies dyspnea. Report cough.    Objective: Vitals:   04/25/21 0400 04/25/21 0500 04/25/21 0600 04/25/21 0700  BP: (!) 157/87 (!) 148/71 (!) 158/79 (!) 177/88  Pulse: 71 68 74 75  Resp: (!) 21 (!) 24 (!) 21 17  Temp:      TempSrc:      SpO2: 97% 99% 96% 97%  Weight:      Height:        Intake/Output Summary (Last 24 hours) at 04/25/2021 0734 Last data filed at 04/25/2021 0600 Gross per 24 hour  Intake 972.82 ml  Output 450 ml  Net 522.82 ml   Filed Weights   04/23/21 0057 04/23/21 0500  Weight: 49.9 kg 47.7 kg    Examination:  General exam: Appears calm and comfortable  Respiratory system: Decreased breath sounds Cardiovascular system: S1 & S2 heard, RRR. No JVD, murmurs, rubs, gallops or clicks. No pedal edema. Gastrointestinal system: Abdomen is nondistended, soft and nontender. No organomegaly or masses felt. Normal bowel sounds heard. Central nervous system: Alert and oriented.  Extremities: Symmetric 5 x 5 power. Skin: No rashes, lesions or ulcers   Data Reviewed: I have personally reviewed following  labs and imaging studies  CBC: Recent Labs  Lab 04/23/21 0104 04/23/21 0136 04/23/21 0216 04/24/21 0315 04/25/21 0208  WBC 12.6*  --   --  15.0* 11.8*  NEUTROABS 5.2  --   --   --   --   HGB 14.5 15.0  15.0 11.6* 13.0 13.7  HCT 44.4 44.0  44.0 34.0* 40.8 41.7  MCV 87.9  --   --  87.7 85.8  PLT 245  --   --  176 [id number]   Basic Metabolic Panel: Recent Labs  Lab 04/23/21 0550 04/23/21 1126 04/23/21 1448  04/24/21 0315 04/24/21 1800 04/25/21 0208  NA 142 140 138 140  --  137  K 3.5 4.2 4.6 3.7  --  3.3*  CL 108 110 108 106  --  104  CO2 '30 28 24 26  '$ --  28  GLUCOSE 107* 110* 80 104*  --  125*  BUN '15 16 16 11  '$ --  15  CREATININE 0.90 1.04* 0.93 0.85  --  1.29*  CALCIUM 8.2* 8.8* 8.8* 9.4  --  8.8*  MG  --   --   --   --  1.9  --   PHOS  --   --   --   --  3.7  --    GFR: Estimated Creatinine Clearance: 34.9 mL/min (A) (by C-G formula based on SCr of 1.29 mg/dL (H)). Liver Function Tests: Recent Labs  Lab 04/23/21 0104  AST 40  ALT 23  ALKPHOS 123  BILITOT 0.4  PROT 7.1  ALBUMIN 4.0   Recent Labs  Lab 04/23/21 0104  LIPASE 116*   No results for input(s): AMMONIA in the last 168 hours. Coagulation Profile: No results for input(s): INR, PROTIME in the last 168 hours. Cardiac Enzymes: Recent Labs  Lab 04/23/21 0104  CKTOTAL 224   BNP (last 3 results) No results for input(s): PROBNP in the last 8760 hours. HbA1C: Recent Labs    04/23/21 0224  HGBA1C 5.9*   CBG: Recent Labs  Lab 04/24/21 1535 04/24/21 1907 04/25/21 0012 04/25/21 0342 04/25/21 0705  GLUCAP 116* 145* 99 151* 76   Lipid Profile: Recent Labs    04/23/21 0550  TRIG 52   Thyroid Function Tests: No results for input(s): TSH, T4TOTAL, FREET4, T3FREE, THYROIDAB in the last 72 hours. Anemia Panel: No results for input(s): VITAMINB12, FOLATE, FERRITIN, TIBC, IRON, RETICCTPCT in the last 72 hours. Sepsis Labs: Recent Labs  Lab 04/23/21 0105 04/23/21 0305  LATICACIDVEN 3.4* 1.6    Recent Results (from the past 240 hour(s))  Blood culture (routine x 2)     Status: None (Preliminary result)   Collection Time: 04/23/21  2:02 AM   Specimen: BLOOD RIGHT HAND  Result Value Ref Range Status   Specimen Description BLOOD RIGHT HAND  Final   Special Requests   Final    BOTTLES DRAWN AEROBIC AND ANAEROBIC Blood Culture results may not be optimal due to an inadequate volume of blood received in  culture bottles   Culture   Final    NO GROWTH 1 DAY Performed at [facility], [place] [address] [place], [place] [address]    Report Status PENDING  Incomplete  Blood culture (routine x 2)     Status: None (Preliminary result)   Collection Time: 04/23/21  2:10 AM   Specimen: BLOOD RIGHT FOREARM  Result Value Ref Range Status   Specimen Description BLOOD RIGHT FOREARM  Final   Special Requests  Final    BOTTLES DRAWN AEROBIC ONLY Blood Culture results may not be optimal due to an inadequate volume of blood received in culture bottles   Culture   Final    NO GROWTH 1 DAY Performed at [facility] [address] [place], [place] [address]    Report Status PENDING  Incomplete  Resp Panel by RT-PCR (Flu A&B, Covid) Nasopharyngeal Swab     Status: None   Collection Time: 04/23/21  2:41 AM   Specimen: Nasopharyngeal Swab; Nasopharyngeal(NP) swabs in vial transport medium  Result Value Ref Range Status   SARS Coronavirus 2 by RT PCR NEGATIVE NEGATIVE Final    Comment: (NOTE) SARS-CoV-2 target nucleic acids are NOT DETECTED.  The SARS-CoV-2 RNA is generally detectable in upper respiratory specimens during the acute phase of infection. The lowest concentration of SARS-CoV-2 viral copies this assay can detect is 138 copies/mL. A negative result does not preclude SARS-Cov-2 infection and should not be used as the sole basis for treatment or other patient management decisions. A negative result may occur with  improper specimen collection/handling, submission of specimen other than nasopharyngeal swab, presence of viral mutation(s) within the areas targeted by this assay, and inadequate number of viral copies(<138 copies/mL). A negative result must be combined with clinical observations, patient history, and epidemiological information. The expected result is Negative.  Fact Sheet for Patients:  EntrepreneurPulse.com.au  Fact Sheet for Healthcare  Providers:  IncredibleEmployment.be  This test is no t yet approved or cleared by the [place] FDA and  has been authorized for detection and/or diagnosis of SARS-CoV-2 by FDA under an Emergency Use Authorization (EUA). This EUA will remain  in effect (meaning this test can be used) for the duration of the COVID-19 declaration under Section 564(b)(1) of the Act, 21 U.S.C.section 360bbb-3(b)(1), unless the authorization is terminated  or revoked sooner.       Influenza A by PCR NEGATIVE NEGATIVE Final   Influenza B by PCR NEGATIVE NEGATIVE Final    Comment: (NOTE) The Xpert Xpress SARS-CoV-2/FLU/RSV plus assay is intended as an aid in the diagnosis of influenza from Nasopharyngeal swab specimens and should not be used as a sole basis for treatment. Nasal washings and aspirates are unacceptable for Xpert Xpress SARS-CoV-2/FLU/RSV testing.  Fact Sheet for Patients: EntrepreneurPulse.com.au  Fact Sheet for Healthcare Providers: IncredibleEmployment.be  This test is not yet approved or cleared by the [place] FDA and has been authorized for detection and/or diagnosis of SARS-CoV-2 by FDA under an Emergency Use Authorization (EUA). This EUA will remain in effect (meaning this test can be used) for the duration of the COVID-19 declaration under Section 564(b)(1) of the Act, 21 U.S.C. section 360bbb-3(b)(1), unless the authorization is terminated or revoked.  Performed at [facility], [place] [address] [place], [place] [address]   MRSA Next Gen by PCR, Nasal     Status: None   Collection Time: 04/23/21  4:44 AM   Specimen: Nasal Mucosa; Nasal Swab  Result Value Ref Range Status   MRSA by PCR Next Gen NOT DETECTED NOT DETECTED Final    Comment: (NOTE) The GeneXpert MRSA Assay (FDA approved for NASAL specimens only), is one component of a comprehensive MRSA colonization surveillance program. It is not intended to  diagnose MRSA infection nor to guide or monitor treatment for MRSA infections. Test performance is not FDA approved in patients less than 47 years old. Performed at [facility], [place] [address], [place], [place]  [address]   Culture, Respiratory w Gram Stain     Status: None (Preliminary result)   Collection Time: 04/23/21  5:06 AM   Specimen: Tracheal Aspirate; Respiratory  Result Value Ref Range Status   Specimen Description TRACHEAL ASPIRATE  Final   Special Requests NONE  Final   Gram Stain   Final    FEW WBC PRESENT,BOTH PMN AND MONONUCLEAR RARE GRAM POSITIVE COCCI RARE GRAM POSITIVE RODS    Culture   Final    CULTURE REINCUBATED FOR BETTER GROWTH Performed at [facility], [place] [name] [place], [place] [address]    Report Status PENDING  Incomplete         Radiology Studies: DG CHEST PORT 1 VIEW  Result Date: 04/24/2021 CLINICAL DATA:  Hypoxia. EXAM: PORTABLE CHEST 1 VIEW COMPARISON:  04/23/2021.  12/18/2019. FINDINGS: Endotracheal tube and NG tube in stable position. Stable cardiomegaly. Bilateral nipple shadows again noted. Stable mild chronic interstitial prominence. No focal infiltrate. No pleural effusion or pneumothorax. IMPRESSION: 1.  Endotracheal tube and NG tube in stable position. 2.  Stable cardiomegaly. 3. Stable mild chronic interstitial prominence. No acute pulmonary disease. Electronically Signed   By: Marcello Moores  Register   On: 04/24/2021 08:00        Scheduled Meds:  Chlorhexidine Gluconate Cloth  6 each Topical Daily   enoxaparin (LOVENOX) injection  40 mg Subcutaneous Q24H   hydrochlorothiazide  25 mg Oral Daily   insulin aspart  0-9 Units Subcutaneous Q4H   losartan  50 mg Oral Daily   mirtazapine  30 mg Oral QHS   potassium chloride  20 mEq Oral Q4H   sertraline  100 mg Oral Daily   Continuous Infusions:  sodium chloride 10 mL/hr at 04/24/21 2000   ampicillin-sulbactam (UNASYN) IV Stopped (04/25/21 0520)   potassium chloride 10  mEq (04/25/21 0659)     LOS: 2 days    Time spent: 35 minutes    Morgan Davenport A Morgan Donna, MD [facility]   If 7PM-7AM, please contact night-coverage [email or web address]  04/25/2021, 7:34 AM

## 2021-04-25 NOTE — Plan of Care (Signed)
  Problem: Clinical Measurements: Goal: Diagnostic test results will improve Outcome: Progressing Goal: Respiratory complications will improve Outcome: Progressing   Problem: Nutrition: Goal: Adequate nutrition will be maintained Outcome: Progressing   Problem: Coping: Goal: Level of anxiety will decrease Outcome: Progressing

## 2021-04-25 NOTE — Progress Notes (Signed)
Pt requesting information/resources regarding substance abuse cessation. Social work consult placed.

## 2021-04-25 NOTE — Consult Note (Signed)
[facility] Consult   Reason for Consult:  Hx of SI and admitted with OD, of undetermined intent Referring Physician:  Chi Rodman Pickle, MD Patient Identification: Morgan Davenport MRN:  [id number] Principal Diagnosis: <principal problem not specified> Diagnosis:  Active Problems:   Drug overdose, intentional ([place])   Total Time spent with patient: 15 minutes  Subjective:   Morgan Davenport is a 60 y.o. female patient admitted with  cocaine overdose. Patient has a hx of polysubstance use disorder with cocaine and THC,and MDD.   HPI:  Overnight patient told RN she was interested in SW consult to get more info on rehab and substance abuse recovery programs. On assessment this AM patient is resting well. Patient reports she is eating well and slept well. Patient reports her mood is "so-so" as she upset with herself for using cocaine and overdosing. Patient denies SI, HI, and AVH. Patient remains adamant that she is interested in recovery and talks about her 8 years of sobriety.  RN reports patient is hopeful to leave the hospital but remains compliant and has opened up about wanting help with her substance use. RN also reported that patient's husband expressed his frustration with patient about her substance use.  Past Psychiatric History: MDD, Zoloft and Remeron   Risk to Self:  NO Risk to Others:  NO Prior Inpatient TherapyVelta Davenport Morgan Davenport 2020 Prior Outpatient Therapy:  Yes, Dr. Beckie Busing currently prescribed Zoloft '150mg'$  and Remeron '15mg'$  QHS and last say him 01/2021, see q 8mo  Past Medical History: See other chart for patient.  Family Psychiatric  History: N/a Social History:  Social History   Substance and Sexual Activity  Alcohol Use Not on file     Social History   Substance and Sexual Activity  Drug Use Not on file    Social History   Socioeconomic History   Marital status: Married    Spouse name: Not on file   Number of children: Not on file   Years of  education: Not on file   Highest education level: Not on file  Occupational History   Not on file  Tobacco Use   Smoking status: Not on file   Smokeless tobacco: Not on file  Substance and Sexual Activity   Alcohol use: Not on file   Drug use: Not on file   Sexual activity: Not on file  Other Topics Concern   Not on file  Social History Narrative   Not on file   Social Determinants of Health   Financial Resource Strain: Not on file  Food Insecurity: Not on file  Transportation Needs: Not on file  Physical Activity: Not on file  Stress: Not on file  Social Connections: Not on file   Additional Social History:    Allergies:   Allergies  Allergen Reactions   Tramadol Other (See Comments)    unknown    Labs:  Results for orders placed or performed during the hospital encounter of 04/23/21 (from the past 48 hour(s))  Glucose, capillary     Status: Abnormal   Collection Time: 04/23/21 11:18 AM  Result Value Ref Range   Glucose-Capillary 112 (H) 70 - 99 mg/dL    Comment: Glucose reference range applies only to samples taken after fasting for at least 8 hours.  Basic metabolic panel     Status: Abnormal   Collection Time: 04/23/21 11:26 AM  Result Value Ref Range   Sodium 140 135 - 145 mmol/L   Potassium  4.2 3.5 - 5.1 mmol/L   Chloride 110 98 - 111 mmol/L   CO2 28 22 - 32 mmol/L   Glucose, Bld 110 (H) 70 - 99 mg/dL    Comment: Glucose reference range applies only to samples taken after fasting for at least 8 hours.   BUN 16 6 - 20 mg/dL   Creatinine, Ser 1.04 (H) 0.44 - 1.00 mg/dL   Calcium 8.8 (L) 8.9 - 10.3 mg/dL   GFR, Estimated >60 >60 mL/min    Comment: (NOTE) Calculated using the CKD-EPI Creatinine Equation (2021)    Anion gap 2 (L) 5 - 15    Comment: REPEATED TO VERIFY Performed at [facility] [address] [place], [place] [address]   Troponin I (High Sensitivity)     Status: Abnormal   Collection Time: 04/23/21 11:26 AM  Result Value Ref  Range   Troponin I (High Sensitivity) 71 (H) <18 ng/L    Comment: DELTA CHECK NOTED (NOTE) Elevated high sensitivity troponin I (hsTnI) values and significant  changes across serial measurements may suggest ACS but many other  chronic and acute conditions are known to elevate hsTnI results.  Refer to the Links section for chest pain algorithms and additional  guidance. Performed at [facility], [place] [address], [place], [place] [id number]   Basic metabolic panel     Status: Abnormal   Collection Time: 04/23/21  2:48 PM  Result Value Ref Range   Sodium 138 135 - 145 mmol/L   Potassium 4.6 3.5 - 5.1 mmol/L   Chloride 108 98 - 111 mmol/L   CO2 24 22 - 32 mmol/L   Glucose, Bld 80 70 - 99 mg/dL    Comment: Glucose reference range applies only to samples taken after fasting for at least 8 hours.   BUN 16 6 - 20 mg/dL   Creatinine, Ser 0.93 0.44 - 1.00 mg/dL   Calcium 8.8 (L) 8.9 - 10.3 mg/dL   GFR, Estimated >60 >60 mL/min    Comment: (NOTE) Calculated using the CKD-EPI Creatinine Equation (2021)    Anion gap 6 5 - 15    Comment: Performed at [facility] [address], [place], [place] [address]  Troponin I (High Sensitivity)     Status: Abnormal   Collection Time: 04/23/21  2:48 PM  Result Value Ref Range   Troponin I (High Sensitivity) 32 (H) <18 ng/L    Comment: DELTA CHECK NOTED (NOTE) Elevated high sensitivity troponin I (hsTnI) values and significant  changes across serial measurements may suggest ACS but many other  chronic and acute conditions are known to elevate hsTnI results.  Refer to the Links section for chest pain algorithms and additional  guidance. Performed at [facility], [place] [address] [place], [place] [address]   Glucose, capillary     Status: Abnormal   Collection Time: 04/23/21  3:11 PM  Result Value Ref Range   Glucose-Capillary 106 (H) 70 - 99 mg/dL    Comment: Glucose reference range applies only to samples taken after fasting  for at least 8 hours.  Glucose, capillary     Status: Abnormal   Collection Time: 04/23/21  7:26 PM  Result Value Ref Range   Glucose-Capillary 108 (H) 70 - 99 mg/dL    Comment: Glucose reference range applies only to samples taken after fasting for at least 8 hours.  Glucose, capillary     Status: Abnormal   Collection Time: 04/23/21  8:53  PM  Result Value Ref Range   Glucose-Capillary 119 (H) 70 - 99 mg/dL    Comment: Glucose reference range applies only to samples taken after fasting for at least 8 hours.  Glucose, capillary     Status: Abnormal   Collection Time: 04/24/21 12:11 AM  Result Value Ref Range   Glucose-Capillary 104 (H) 70 - 99 mg/dL    Comment: Glucose reference range applies only to samples taken after fasting for at least 8 hours.  CBC     Status: Abnormal   Collection Time: 04/24/21  3:15 AM  Result Value Ref Range   WBC 15.0 (H) 4.0 - 10.5 K/uL   RBC 4.65 3.87 - 5.11 MIL/uL   Hemoglobin 13.0 12.0 - 15.0 g/dL   HCT 40.8 36.0 - 46.0 %   MCV 87.7 80.0 - 100.0 fL   MCH 28.0 26.0 - 34.0 pg   MCHC 31.9 30.0 - 36.0 g/dL   RDW 13.8 11.5 - 15.5 %   Platelets 176 150 - 400 K/uL   nRBC 0.0 0.0 - 0.2 %    Comment: Performed at [facility], [place] [address], [place], [place] [id number]  Basic metabolic panel     Status: Abnormal   Collection Time: 04/24/21  3:15 AM  Result Value Ref Range   Sodium 140 135 - 145 mmol/L   Potassium 3.7 3.5 - 5.1 mmol/L   Chloride 106 98 - 111 mmol/L   CO2 26 22 - 32 mmol/L   Glucose, Bld 104 (H) 70 - 99 mg/dL    Comment: Glucose reference range applies only to samples taken after fasting for at least 8 hours.   BUN 11 6 - 20 mg/dL   Creatinine, Ser 0.85 0.44 - 1.00 mg/dL   Calcium 9.4 8.9 - 10.3 mg/dL   GFR, Estimated >60 >60 mL/min    Comment: (NOTE) Calculated using the CKD-EPI Creatinine Equation (2021)    Anion gap 8 5 - 15    Comment: Performed at [facility] [address] [place], [place] [address]  Glucose,  capillary     Status: Abnormal   Collection Time: 04/24/21  3:24 AM  Result Value Ref Range   Glucose-Capillary 106 (H) 70 - 99 mg/dL    Comment: Glucose reference range applies only to samples taken after fasting for at least 8 hours.  Glucose, capillary     Status: Abnormal   Collection Time: 04/24/21  7:15 AM  Result Value Ref Range   Glucose-Capillary 118 (H) 70 - 99 mg/dL    Comment: Glucose reference range applies only to samples taken after fasting for at least 8 hours.  Glucose, capillary     Status: Abnormal   Collection Time: 04/24/21 11:00 AM  Result Value Ref Range   Glucose-Capillary 108 (H) 70 - 99 mg/dL    Comment: Glucose reference range applies only to samples taken after fasting for at least 8 hours.  Glucose, capillary     Status: Abnormal   Collection Time: 04/24/21  3:35 PM  Result Value Ref Range   Glucose-Capillary 116 (H) 70 - 99 mg/dL    Comment: Glucose reference range applies only to samples taken after fasting for at least 8 hours.  Magnesium     Status: None   Collection Time: 04/24/21  6:00 PM  Result Value Ref Range   Magnesium 1.9 1.7 - 2.4 mg/dL    Comment: Performed at [facility], [place],  [place] [address]  Phosphorus     Status: None   Collection Time: 04/24/21  6:00 PM  Result Value Ref Range   Phosphorus 3.7 2.5 - 4.6 mg/dL    Comment: Performed at [facility], [place] [address], [place], [place] [address]  Glucose, capillary     Status: Abnormal   Collection Time: 04/24/21  7:07 PM  Result Value Ref Range   Glucose-Capillary 145 (H) 70 - 99 mg/dL    Comment: Glucose reference range applies only to samples taken after fasting for at least 8 hours.  Glucose, capillary     Status: None   Collection Time: 04/25/21 12:12 AM  Result Value Ref Range   Glucose-Capillary 99 70 - 99 mg/dL    Comment: Glucose reference range applies only to samples taken after fasting for at least 8 hours.  CBC     Status: Abnormal    Collection Time: 04/25/21  2:08 AM  Result Value Ref Range   WBC 11.8 (H) 4.0 - 10.5 K/uL   RBC 4.86 3.87 - 5.11 MIL/uL   Hemoglobin 13.7 12.0 - 15.0 g/dL   HCT 41.7 36.0 - 46.0 %   MCV 85.8 80.0 - 100.0 fL   MCH 28.2 26.0 - 34.0 pg   MCHC 32.9 30.0 - 36.0 g/dL   RDW 13.4 11.5 - 15.5 %   Platelets 218 150 - 400 K/uL   nRBC 0.0 0.0 - 0.2 %    Comment: Performed at [facility], [place] [address] [place], [place] [id number]  Basic metabolic panel     Status: Abnormal   Collection Time: 04/25/21  2:08 AM  Result Value Ref Range   Sodium 137 135 - 145 mmol/L   Potassium 3.3 (L) 3.5 - 5.1 mmol/L   Chloride 104 98 - 111 mmol/L   CO2 28 22 - 32 mmol/L   Glucose, Bld 125 (H) 70 - 99 mg/dL    Comment: Glucose reference range applies only to samples taken after fasting for at least 8 hours.   BUN 15 6 - 20 mg/dL   Creatinine, Ser 1.29 (H) 0.44 - 1.00 mg/dL   Calcium 8.8 (L) 8.9 - 10.3 mg/dL   GFR, Estimated 48 (L) >60 mL/min    Comment: (NOTE) Calculated using the CKD-EPI Creatinine Equation (2021)    Anion gap 5 5 - 15    Comment: Performed at [facility] [address] [place], [place] [address]  Glucose, capillary     Status: Abnormal   Collection Time: 04/25/21  3:42 AM  Result Value Ref Range   Glucose-Capillary 151 (H) 70 - 99 mg/dL    Comment: Glucose reference range applies only to samples taken after fasting for at least 8 hours.  Glucose, capillary     Status: None   Collection Time: 04/25/21  7:05 AM  Result Value Ref Range   Glucose-Capillary 76 70 - 99 mg/dL    Comment: Glucose reference range applies only to samples taken after fasting for at least 8 hours.    Current Facility-Administered Medications  Medication Dose Route Frequency Provider Last Rate Last Admin   0.9 %  sodium chloride infusion   Intravenous PRN Margaretha Seeds, MD 10 mL/hr at 04/24/21 2000 Infusion Verify at 04/24/21 2000   acetaminophen (TYLENOL) tablet 650 mg  650 mg Oral Q6H PRN  Iona Beard, MD   650 mg at 04/24/21 1541   Ampicillin-Sulbactam (UNASYN) 3 g in sodium chloride 0.9 %  100 mL IVPB  3 g Intravenous Q6H Ursula Beath, RPH 200 mL/hr at 04/25/21 0945 3 g at 04/25/21 0945   Chlorhexidine Gluconate Cloth 2 % PADS 6 each  6 each Topical Daily Margaretha Seeds, MD   6 each at 04/24/21 1156   docusate (COLACE) 50 MG/5ML liquid 100 mg  100 mg Per Tube BID PRN Jacky Kindle, MD       enoxaparin (LOVENOX) injection 40 mg  40 mg Subcutaneous Q24H Jacky Kindle, MD   40 mg at 04/24/21 1547   hydrALAZINE (APRESOLINE) injection 10-40 mg  10-40 mg Intravenous Q4H PRN Erick Colace, NP   20 mg at 04/24/21 0539   hydrochlorothiazide (HYDRODIURIL) tablet 25 mg  25 mg Oral Daily Margaretha Seeds, MD   25 mg at 04/25/21 [id number]   insulin aspart (novoLOG) injection 0-9 Units  0-9 Units Subcutaneous Q4H Jacky Kindle, MD   2 Units at 04/25/21 0443   losartan (COZAAR) tablet 50 mg  50 mg Oral Daily Margaretha Seeds, MD   50 mg at 04/25/21 [id number]   mirtazapine (REMERON) tablet 30 mg  30 mg Oral QHS Damita Dunnings B, MD   30 mg at 04/24/21 2159   polyethylene glycol (MIRALAX / GLYCOLAX) packet 17 g  17 g Oral Daily PRN Jacky Kindle, MD       sertraline (ZOLOFT) tablet 100 mg  100 mg Oral Daily Regalado, Belkys A, MD   100 mg at 04/25/21 [id number]    Musculoskeletal: Strength & Muscle Tone: within normal limits Gait & Station:  remains in bed Patient leans: N/A            Psychiatric Specialty Exam:  Presentation  General Appearance: Appropriate for Environment  Eye Contact:Good  Speech:Clear and Coherent  Speech Volume:Normal  Handedness: No data recorded  Mood and Affect  Mood:Dysphoric  Affect:Appropriate   Thought Process  Thought Processes:Coherent  Descriptions of Associations:Intact  Orientation:Full (Time, Place and Person)  Thought Content:Logical  History of Schizophrenia/Schizoaffective disorder:No data recorded Duration of Psychotic  Symptoms:No data recorded Hallucinations:Hallucinations: None  Ideas of Reference:None  Suicidal Thoughts:Suicidal Thoughts: No  Homicidal Thoughts:Homicidal Thoughts: No   Sensorium  Memory:Immediate Good; Recent Good; Remote Good  Judgment:Fair (Improved)  Insight:Fair   Executive Functions  Concentration:Good  Attention Span:Good  [place] of Knowledge:Good  Language:Good   Psychomotor Activity  Psychomotor Activity:Psychomotor Activity: Normal   Assets  Assets:Communication Skills; Desire for Improvement; Resilience; Social Support; Housing; Intimacy   Sleep  Sleep:Sleep: Fair   Physical Exam: Physical Exam Constitutional:      Appearance: Normal appearance.  HENT:     Head: Normocephalic and atraumatic.  Eyes:     Extraocular Movements: Extraocular movements intact.     Conjunctiva/sclera: Conjunctivae normal.  Cardiovascular:     Rate and Rhythm: Normal rate.  Pulmonary:     Effort: Pulmonary effort is normal.     Breath sounds: Normal breath sounds.  Abdominal:     General: Abdomen is flat.  Musculoskeletal:        General: Normal range of motion.  Skin:    General: Skin is warm and dry.  Neurological:     General: No focal deficit present.     Mental Status: She is alert.   Review of Systems  Constitutional:  Negative for chills and fever.  HENT:  Negative for hearing loss.   Eyes:  Negative for blurred vision.  Respiratory:  Negative for cough and wheezing.   Cardiovascular:  Negative for chest pain.  Gastrointestinal:  Negative for abdominal pain.  Neurological:  Negative for dizziness.  Psychiatric/Behavioral:  Negative for suicidal ideas.   Blood pressure (!) 177/88, pulse 75, temperature 98.9 F (37.2 C), temperature source Oral, resp. rate 17, height '5\' 2"'$  (1.575 m), weight 47.7 kg, SpO2 97 %. Body mass index is 19.23 kg/m.  Treatment Plan Summary: Medication management Unintentional Drug OD Substance induced  mood disorder Patient remains stable and reports that she is sleeping well and endorses that she will try to be compliant with her home psych medications as they have been beneficial to her in the past.  - Recommend SW/ TOC consult to help with recovery program assistance - Continue Zoloft '100mg'$  - Continue Remeron '30mg'$  QHS   Patient is determined to be psychiatrically stable at this time. Psychiatry will sign off. Please do not hesitate to call back if questions arise. Thank you for this consult.   Disposition: Supportive therapy provided about ongoing stressors. Recommend rehab and follow-up with her OP Psychiatrist.  PGY-2 Freida Busman, MD 04/25/2021 11:02 AM

## 2021-04-25 NOTE — Progress Notes (Signed)
Report called to nurse on 6e26 . Patient stable up in room

## 2021-04-26 DIAGNOSIS — T50901A Poisoning by unspecified drugs, medicaments and biological substances, accidental (unintentional), initial encounter: Secondary | ICD-10-CM

## 2021-04-26 DIAGNOSIS — J9601 Acute respiratory failure with hypoxia: Secondary | ICD-10-CM

## 2021-04-26 LAB — BASIC METABOLIC PANEL
Anion gap: 6 (ref 5–15)
BUN: 10 mg/dL (ref 6–20)
CO2: 30 mmol/L (ref 22–32)
Calcium: 9 mg/dL (ref 8.9–10.3)
Chloride: 104 mmol/L (ref 98–111)
Creatinine, Ser: 0.77 mg/dL (ref 0.44–1.00)
GFR, Estimated: >60 mL/min (ref >60)
Glucose, Bld: 132 mg/dL — ABNORMAL HIGH (ref 70–99)
Potassium: 3.5 mmol/L (ref 3.5–5.1)
Sodium: 140 mmol/L (ref 135–145)

## 2021-04-26 LAB — CBC
HCT: 39.9 % (ref 36.0–46.0)
Hemoglobin: 13.4 g/dL (ref 12.0–15.0)
MCH: 28.5 pg (ref 26.0–34.0)
MCHC: 33.6 g/dL (ref 30.0–36.0)
MCV: 84.9 fL (ref 80.0–100.0)
Platelets: 232 10*3/uL (ref 150–400)
RBC: 4.7 MIL/uL (ref 3.87–5.11)
RDW: 13.2 % (ref 11.5–15.5)
WBC: 9.9 10*3/uL (ref 4.0–10.5)
nRBC: 0 % (ref 0.0–0.2)

## 2021-04-26 LAB — GLUCOSE, CAPILLARY
Glucose-Capillary: 98 mg/dL (ref 70–99)
Glucose-Capillary: 127 mg/dL — ABNORMAL HIGH (ref 70–99)

## 2021-04-26 LAB — MAGNESIUM: Magnesium: 1.7 mg/dL (ref 1.7–2.4)

## 2021-04-26 MED ORDER — SERTRALINE HCL 100 MG PO TABS: 100.0000 mg | ORAL_TABLET | Freq: Every day | ORAL | 3 refills | Status: AC

## 2021-04-26 MED ORDER — LOSARTAN POTASSIUM 50 MG PO TABS: 50.0000 mg | ORAL_TABLET | Freq: Every day | ORAL | 3 refills | Status: DC

## 2021-04-26 MED ORDER — AMOXICILLIN-POT CLAVULANATE 875-125 MG PO TABS: 1.0000 | ORAL_TABLET | Freq: Two times a day (BID) | ORAL | 0 refills | Status: AC

## 2021-04-26 MED ORDER — HYDROCHLOROTHIAZIDE 25 MG PO TABS: 25.0000 mg | ORAL_TABLET | Freq: Every day | ORAL | 3 refills | Status: DC

## 2021-04-26 MED ORDER — AMLODIPINE BESYLATE 5 MG PO TABS: 5.0000 mg | ORAL_TABLET | Freq: Every day | ORAL | 3 refills | Status: DC

## 2021-04-26 MED ORDER — MIRTAZAPINE 30 MG PO TABS: 30.0000 mg | ORAL_TABLET | Freq: Every day | ORAL | 2 refills | Status: AC

## 2021-04-26 MED ORDER — AMLODIPINE BESYLATE 5 MG PO TABS
5.0000 mg | ORAL_TABLET | Freq: Every day | ORAL | Status: DC
  Administered 2021-04-26: 5 mg via ORAL
  Filled 2021-04-26: qty 1

## 2021-04-26 NOTE — Progress Notes (Signed)
PT Cancellation Note  Patient Details Name: Morgan Davenport MRN: [id number] DOB: 1961/01/10   Cancelled Treatment:    Reason Eval/Treat Not Completed: PT screened, no needs identified, will sign off. Pt observed ambulating in hallway without difficulty.    Shary Decamp [facility] 04/26/2021, 11:55 AM [place] Pager [phone] Office [phone]

## 2021-04-26 NOTE — Discharge Summary (Signed)
Physician Discharge Summary  [place] [id number] DOB: [birth year] DOA: 04/23/2021  PCP: Kelton Pillar, MD  Admit date: 04/23/2021 Discharge date: 04/26/2021  Admitted From: Home  Disposition:  Home   Recommendations for Outpatient Follow-up:  Follow up with PCP in 1-2 weeks Please obtain BMP/CBC in one week Follow up resolution PNA.  Needs follow up BP Needs to follow up with substance abuse rehab.    Home Health: none  Discharge Condition: Stable.  CODE STATUS: Full Code Diet recommendation: Heart Healthy   Brief/Interim Summary: 60 year old who was ided from a motel, after EMS was notified of apparent overdose.  Patient was found obtunded and apneic, she was given intramuscular and IV Narcan with no initial response.  In the emergency department she started screaming, agitated and vomited multiple times with visible aspiration into airway, requiring endotracheal intubation to protect airway. Patient was intubated on admission 7/26. She was  wean off from sedation and extubated on 7/27.  1-Acute toxic encephalopathy likely secondary to drug overdose: Patient  alert and oriented x3.  Back to baseline.   2-Acute hypoxic/hypercapnic respiratory failure in the setting of apparent of unintentional drug overdose: Aspiration pneumonia Extubated 7/27. She is currently on room air. Continue with IV Unasyn to cover for pneumonia. Discharge on 3 more days of Augmentin.  WBC trendig down. She denies intentional overdose.   3-Elevation of troponin: Mild, in the setting of demand ischemia in the setting of cocaine use   4-Hypertension: Resume losartan and hydrochlorothiazide, continue with as needed hydralazine.  Started norvasc, today.  Anemia secondary to fluid resuscitation resolved hemodilution   Hypokalemia: Replaced.  Hypocalcemia: resolved Polysubstance abuse, behavioral health issues: Unintentional Drug overdose.  Psychiatric consulted and patient has been  cleared by psych.  Patient would like to follow-up with outpatient, for drug rehab restarted on Zoloft and Remeron   Estimated body mass index is 19.23 kg/m as calculated from the following:   Height as of this encounter: '5\' 2"'$  (1.575 m).   Weight as of this encounter: 47.7 kg.      Discharge Diagnoses:  Active Problems:   Depression   Drug overdose, intentional ([place])   Acute respiratory failure with hypoxia [facility])   Drug overdose    Discharge Instructions  Discharge Instructions     Diet - low sodium heart healthy   Complete by: As directed    Increase activity slowly   Complete by: As directed       Allergies as of 04/26/2021       Reactions   Tramadol Itching, Other (See Comments)   Reaction:  Headaches    Tramadol Other (See Comments)   unknown        Medication List     STOP taking these medications    clonazePAM 0.5 MG tablet Commonly known as: KLONOPIN   naproxen sodium 220 MG tablet Commonly known as: ALEVE       TAKE these medications    amLODipine 5 MG tablet Commonly known as: NORVASC Take 1 tablet (5 mg total) by mouth daily.   amoxicillin-clavulanate 875-125 MG tablet Commonly known as: Augmentin Take 1 tablet by mouth 2 (two) times daily for 4 days.   glipiZIDE 5 MG tablet Commonly known as: GLUCOTROL Take 5 mg by mouth 2 (two) times daily.   hydrochlorothiazide 25 MG tablet Commonly known as: HYDRODIURIL Take 1 tablet (25 mg total) by mouth daily.   losartan 50 MG tablet Commonly known as: COZAAR Take 1 tablet (50 mg total)  by mouth daily.   mirtazapine 30 MG tablet Commonly known as: REMERON Take 1 tablet (30 mg total) by mouth at bedtime.   sertraline 100 MG tablet Commonly known as: ZOLOFT Take 1 tablet (100 mg total) by mouth daily.        Follow-up Information     [facility] Follow up on 07/12/2021.   Specialty: Family Medicine Why: @ 8:50 am for hospital follow up appointment. If you  cannot make this scheduled appointment please call to reschedule. Contact information: [place] [phone]               Allergies  Allergen Reactions   Tramadol Itching and Other (See Comments)    Reaction:  Headaches    Tramadol Other (See Comments)    unknown    Consultations: Psych CCM admitted patient  Procedures/Studies: CT Head Wo Contrast  Result Date: 04/23/2021 CLINICAL DATA:  Female of unknown age. Delirium. Possible overdose. EXAM: CT HEAD WITHOUT CONTRAST TECHNIQUE: Contiguous axial images were obtained from the base of the skull through the vertex without intravenous contrast. COMPARISON:  None. FINDINGS: Brain: No midline shift, ventriculomegaly, mass effect, evidence of mass lesion, intracranial hemorrhage or evidence of cortically based acute infarction. Gray-white matter differentiation is within normal limits throughout the brain. Cerebral volume within normal limits. No encephalomalacia identified. Vascular: No suspicious intracranial vascular hyperdensity. Skull: Intact, negative. Sinuses/Orbits: Scattered mucosal thickening and trace fluid in the bilateral paranasal sinuses. Tympanic cavities and mastoids are clear. Other: Fluid in the nasal cavity and visible nasopharynx. Visualized orbits and scalp soft tissues are within normal limits. IMPRESSION: 1. Negative non contrast CT appearance of the brain. 2. Mild paranasal sinus inflammation associated with fluid in the visible nasopharynx. Electronically Signed   By: Genevie Ann M.D.   On: 04/23/2021 05:13   DG CHEST PORT 1 VIEW  Result Date: 04/24/2021 CLINICAL DATA:  Hypoxia. EXAM: PORTABLE CHEST 1 VIEW COMPARISON:  04/23/2021.  12/18/2019. FINDINGS: Endotracheal tube and NG tube in stable position. Stable cardiomegaly. Bilateral nipple shadows again noted. Stable mild chronic interstitial prominence. No focal infiltrate. No pleural effusion or pneumothorax.  IMPRESSION: 1.  Endotracheal tube and NG tube in stable position. 2.  Stable cardiomegaly. 3. Stable mild chronic interstitial prominence. No acute pulmonary disease. Electronically Signed   By: Marcello Moores  Register   On: 04/24/2021 08:00   DG Chest Portable 1 View  Result Date: 04/23/2021 CLINICAL DATA:  Overdose. EXAM: PORTABLE CHEST 1 VIEW COMPARISON:  None. FINDINGS: An endotracheal tube is seen with its distal tip approximately 2.2 cm from the carina. A nasogastric tube is noted with its distal end extending into the gastric lumen. Mild, diffuse, chronic appearing increased lung markings are seen. There is no evidence of acute infiltrate, pleural effusion or pneumothorax. The heart size and mediastinal contours are within normal limits. Mild calcification of the aortic arch is noted. The visualized skeletal structures are unremarkable. IMPRESSION: Chronic-appearing increased lung markings without acute or active cardiopulmonary disease. Electronically Signed   By: Virgina Norfolk M.D.   On: 04/23/2021 03:26   DG Abd Portable 1 View  Result Date: 04/23/2021 CLINICAL DATA:  Overdose. EXAM: PORTABLE ABDOMEN - 1 VIEW COMPARISON:  None. FINDINGS: A nasogastric tube is seen with its distal tip noted within the body of the stomach. The distal side hole is approximately 9.8 cm distal to the gastroesophageal junction. The stomach is moderately distended. The bowel gas pattern is otherwise normal.  A moderate amount of stool is seen throughout the colon. No radio-opaque calculi or other significant radiographic abnormality are seen. IMPRESSION: 1. Nasogastric tube positioning, as described above. 2. Moderate stool burden without evidence of bowel obstruction. Electronically Signed   By: Virgina Norfolk M.D.   On: 04/23/2021 03:27     Subjective: She is feeling well, ready to go home. Mild headache  Discharge Exam: Vitals:   04/26/21 0429 04/26/21 0901  BP: (!) 186/94 (!) 144/83  Pulse: 67   Resp: 19    Temp: 98.3 F (36.8 C)   SpO2: 100%      General: Pt is alert, awake, not in acute distress Cardiovascular: RRR, S1/S2 +, no rubs, no gallops Respiratory: CTA bilaterally, no wheezing, no rhonchi Abdominal: Soft, NT, ND, bowel sounds + Extremities: no edema, no cyanosis    The results of significant diagnostics from this hospitalization (including imaging, microbiology, ancillary and laboratory) are listed below for reference.     Microbiology: Recent Results (from the past 240 hour(s))  Blood culture (routine x 2)     Status: None (Preliminary result)   Collection Time: 04/23/21  2:02 AM   Specimen: BLOOD RIGHT HAND  Result Value Ref Range Status   Specimen Description BLOOD RIGHT HAND  Final   Special Requests   Final    BOTTLES DRAWN AEROBIC AND ANAEROBIC Blood Culture results may not be optimal due to an inadequate volume of blood received in culture bottles   Culture   Final    NO GROWTH 2 DAYS Performed at [facility], [place] [address] [place], [place] [address]    Report Status PENDING  Incomplete  Blood culture (routine x 2)     Status: None (Preliminary result)   Collection Time: 04/23/21  2:10 AM   Specimen: BLOOD RIGHT FOREARM  Result Value Ref Range Status   Specimen Description BLOOD RIGHT FOREARM  Final   Special Requests   Final    BOTTLES DRAWN AEROBIC ONLY Blood Culture results may not be optimal due to an inadequate volume of blood received in culture bottles   Culture   Final    NO GROWTH 2 DAYS Performed at [facility], [place] [address] [place], [place] [address]    Report Status PENDING  Incomplete  Resp Panel by RT-PCR (Flu A&B, Covid) Nasopharyngeal Swab     Status: None   Collection Time: 04/23/21  2:41 AM   Specimen: Nasopharyngeal Swab; Nasopharyngeal(NP) swabs in vial transport medium  Result Value Ref Range Status   SARS Coronavirus 2 by RT PCR NEGATIVE NEGATIVE Final    Comment: (NOTE) SARS-CoV-2 target nucleic acids are NOT  DETECTED.  The SARS-CoV-2 RNA is generally detectable in upper respiratory specimens during the acute phase of infection. The lowest concentration of SARS-CoV-2 viral copies this assay can detect is 138 copies/mL. A negative result does not preclude SARS-Cov-2 infection and should not be used as the sole basis for treatment or other patient management decisions. A negative result may occur with  improper specimen collection/handling, submission of specimen other than nasopharyngeal swab, presence of viral mutation(s) within the areas targeted by this assay, and inadequate number of viral copies(<138 copies/mL). A negative result must be combined with clinical observations, patient history, and epidemiological information. The expected result is Negative.  Fact Sheet for Patients:  EntrepreneurPulse.com.au  Fact Sheet for Healthcare Providers:  IncredibleEmployment.be  This test is no t yet approved or cleared by the [place] and  has been authorized for detection and/or diagnosis of SARS-CoV-2 by FDA under an Emergency Use Authorization (EUA). This EUA will remain  in effect (meaning this test can be used) for the duration of the COVID-19 declaration under Section 564(b)(1) of the Act, 21 U.S.C.section 360bbb-3(b)(1), unless the authorization is terminated  or revoked sooner.       Influenza A by PCR NEGATIVE NEGATIVE Final   Influenza B by PCR NEGATIVE NEGATIVE Final    Comment: (NOTE) The Xpert Xpress SARS-CoV-2/FLU/RSV plus assay is intended as an aid in the diagnosis of influenza from Nasopharyngeal swab specimens and should not be used as a sole basis for treatment. Nasal washings and aspirates are unacceptable for Xpert Xpress SARS-CoV-2/FLU/RSV testing.  Fact Sheet for Patients: EntrepreneurPulse.com.au  Fact Sheet for Healthcare Providers: IncredibleEmployment.be  This test is not yet  approved or cleared by the [place] FDA and has been authorized for detection and/or diagnosis of SARS-CoV-2 by FDA under an Emergency Use Authorization (EUA). This EUA will remain in effect (meaning this test can be used) for the duration of the COVID-19 declaration under Section 564(b)(1) of the Act, 21 U.S.C. section 360bbb-3(b)(1), unless the authorization is terminated or revoked.  Performed at [facility], [place] [address] [place], [place] [address]   MRSA Next Gen by PCR, Nasal     Status: None   Collection Time: 04/23/21  4:44 AM   Specimen: Nasal Mucosa; Nasal Swab  Result Value Ref Range Status   MRSA by PCR Next Gen NOT DETECTED NOT DETECTED Final    Comment: (NOTE) The GeneXpert MRSA Assay (FDA approved for NASAL specimens only), is one component of a comprehensive MRSA colonization surveillance program. It is not intended to diagnose MRSA infection nor to guide or monitor treatment for MRSA infections. Test performance is not FDA approved in patients less than 48 years old. Performed at [facility], [place] [address] [place], [place] [address]   Culture, Respiratory w Gram Stain     Status: None   Collection Time: 04/23/21  5:06 AM   Specimen: Tracheal Aspirate; Respiratory  Result Value Ref Range Status   Specimen Description TRACHEAL ASPIRATE  Final   Special Requests NONE  Final   Gram Stain   Final    FEW WBC PRESENT,BOTH PMN AND MONONUCLEAR RARE GRAM POSITIVE COCCI RARE GRAM POSITIVE RODS    Culture   Final    MODERATE Normal respiratory flora-no Staph aureus or Pseudomonas seen Performed at [facility], [place] [address] [place], [place] [address]    Report Status 04/25/2021 FINAL  Final     Labs: BNP (last 3 results) No results for input(s): BNP in the last 8760 hours. Basic Metabolic Panel: Recent Labs  Lab 04/23/21 1126 04/23/21 1448 04/24/21 0315 04/24/21 1800 04/25/21 0208 04/26/21 0142  NA 140 138 140  --  137 140   K 4.2 4.6 3.7  --  3.3* 3.5  CL 110 108 106  --  104 104  CO2 '28 24 26  '$ --  28 30  GLUCOSE 110* 80 104*  --  125* 132*  BUN '16 16 11  '$ --  15 10  CREATININE 1.04* 0.93 0.85  --  1.29* 0.77  CALCIUM 8.8* 8.8* 9.4  --  8.8* 9.0  MG  --   --   --  1.9  --  1.7  PHOS  --   --   --  3.7  --   --  Liver Function Tests: Recent Labs  Lab 04/23/21 0104  AST 40  ALT 23  ALKPHOS 123  BILITOT 0.4  PROT 7.1  ALBUMIN 4.0   Recent Labs  Lab 04/23/21 0104  LIPASE 116*   No results for input(s): AMMONIA in the last 168 hours. CBC: Recent Labs  Lab 04/23/21 0104 04/23/21 0136 04/23/21 0216 04/24/21 0315 04/25/21 0208 04/26/21 0142  WBC 12.6*  --   --  15.0* 11.8* 9.9  NEUTROABS 5.2  --   --   --   --   --   HGB 14.5 15.0  15.0 11.6* 13.0 13.7 13.4  HCT 44.4 44.0  44.0 34.0* 40.8 41.7 39.9  MCV 87.9  --   --  87.7 85.8 84.9  PLT 245  --   --  176 218 232   Cardiac Enzymes: Recent Labs  Lab 04/23/21 0104  CKTOTAL 224   BNP: Invalid input(s): POCBNP CBG: Recent Labs  Lab 04/25/21 1501 04/25/21 2116 04/25/21 2350 04/26/21 0434 04/26/21 0754  GLUCAP 88 166* 103* 98 127*   D-Dimer No results for input(s): DDIMER in the last 72 hours. Hgb A1c No results for input(s): HGBA1C in the last 72 hours. Lipid Profile No results for input(s): CHOL, HDL, LDLCALC, TRIG, CHOLHDL, LDLDIRECT in the last 72 hours. Thyroid function studies No results for input(s): TSH, T4TOTAL, T3FREE, THYROIDAB in the last 72 hours.  Invalid input(s): FREET3 Anemia work up No results for input(s): VITAMINB12, FOLATE, FERRITIN, TIBC, IRON, RETICCTPCT in the last 72 hours. Urinalysis    Component Value Date/Time   COLORURINE YELLOW 04/23/2021 0130   APPEARANCEUR CLEAR 04/23/2021 0130   LABSPEC 1.025 04/23/2021 0130   PHURINE 5.0 04/23/2021 0130   GLUCOSEU NEGATIVE 04/23/2021 0130   HGBUR NEGATIVE 04/23/2021 0130   BILIRUBINUR NEGATIVE 04/23/2021 0130   KETONESUR NEGATIVE 04/23/2021 0130    PROTEINUR 30 (A) 04/23/2021 0130   UROBILINOGEN 0.2 12/11/2009 1636   NITRITE NEGATIVE 04/23/2021 0130   LEUKOCYTESUR NEGATIVE 04/23/2021 0130   Sepsis Labs Invalid input(s): PROCALCITONIN,  WBC,  LACTICIDVEN Microbiology Recent Results (from the past 240 hour(s))  Blood culture (routine x 2)     Status: None (Preliminary result)   Collection Time: 04/23/21  2:02 AM   Specimen: BLOOD RIGHT HAND  Result Value Ref Range Status   Specimen Description BLOOD RIGHT HAND  Final   Special Requests   Final    BOTTLES DRAWN AEROBIC AND ANAEROBIC Blood Culture results may not be optimal due to an inadequate volume of blood received in culture bottles   Culture   Final    NO GROWTH 2 DAYS Performed at [facility], [place] [address], [place], [place] [address]    Report Status PENDING  Incomplete  Blood culture (routine x 2)     Status: None (Preliminary result)   Collection Time: 04/23/21  2:10 AM   Specimen: BLOOD RIGHT FOREARM  Result Value Ref Range Status   Specimen Description BLOOD RIGHT FOREARM  Final   Special Requests   Final    BOTTLES DRAWN AEROBIC ONLY Blood Culture results may not be optimal due to an inadequate volume of blood received in culture bottles   Culture   Final    NO GROWTH 2 DAYS Performed at [facility], [place] [address], [place], [place] [address]    Report Status PENDING  Incomplete  Resp Panel by RT-PCR (Flu A&B, Covid) Nasopharyngeal Swab     Status: None   Collection  Time: 04/23/21  2:41 AM   Specimen: Nasopharyngeal Swab; Nasopharyngeal(NP) swabs in vial transport medium  Result Value Ref Range Status   SARS Coronavirus 2 by RT PCR NEGATIVE NEGATIVE Final    Comment: (NOTE) SARS-CoV-2 target nucleic acids are NOT DETECTED.  The SARS-CoV-2 RNA is generally detectable in upper respiratory specimens during the acute phase of infection. The lowest concentration of SARS-CoV-2 viral copies this assay can detect is 138 copies/mL. A negative result  does not preclude SARS-Cov-2 infection and should not be used as the sole basis for treatment or other patient management decisions. A negative result may occur with  improper specimen collection/handling, submission of specimen other than nasopharyngeal swab, presence of viral mutation(s) within the areas targeted by this assay, and inadequate number of viral copies(<138 copies/mL). A negative result must be combined with clinical observations, patient history, and epidemiological information. The expected result is Negative.  Fact Sheet for Patients:  EntrepreneurPulse.com.au  Fact Sheet for Healthcare Providers:  IncredibleEmployment.be  This test is no t yet approved or cleared by the [place] FDA and  has been authorized for detection and/or diagnosis of SARS-CoV-2 by FDA under an Emergency Use Authorization (EUA). This EUA will remain  in effect (meaning this test can be used) for the duration of the COVID-19 declaration under Section 564(b)(1) of the Act, 21 U.S.C.section 360bbb-3(b)(1), unless the authorization is terminated  or revoked sooner.       Influenza A by PCR NEGATIVE NEGATIVE Final   Influenza B by PCR NEGATIVE NEGATIVE Final    Comment: (NOTE) The Xpert Xpress SARS-CoV-2/FLU/RSV plus assay is intended as an aid in the diagnosis of influenza from Nasopharyngeal swab specimens and should not be used as a sole basis for treatment. Nasal washings and aspirates are unacceptable for Xpert Xpress SARS-CoV-2/FLU/RSV testing.  Fact Sheet for Patients: EntrepreneurPulse.com.au  Fact Sheet for Healthcare Providers: IncredibleEmployment.be  This test is not yet approved or cleared by the [place] FDA and has been authorized for detection and/or diagnosis of SARS-CoV-2 by FDA under an Emergency Use Authorization (EUA). This EUA will remain in effect (meaning this test can be used) for  the duration of the COVID-19 declaration under Section 564(b)(1) of the Act, 21 U.S.C. section 360bbb-3(b)(1), unless the authorization is terminated or revoked.  Performed at [facility], [place] [address] [place], [place] [address]   MRSA Next Gen by PCR, Nasal     Status: None   Collection Time: 04/23/21  4:44 AM   Specimen: Nasal Mucosa; Nasal Swab  Result Value Ref Range Status   MRSA by PCR Next Gen NOT DETECTED NOT DETECTED Final    Comment: (NOTE) The GeneXpert MRSA Assay (FDA approved for NASAL specimens only), is one component of a comprehensive MRSA colonization surveillance program. It is not intended to diagnose MRSA infection nor to guide or monitor treatment for MRSA infections. Test performance is not FDA approved in patients less than 21 years old. Performed at [facility], [place] [address], [place], [place] [address]   Culture, Respiratory w Gram Stain     Status: None   Collection Time: 04/23/21  5:06 AM   Specimen: Tracheal Aspirate; Respiratory  Result Value Ref Range Status   Specimen Description TRACHEAL ASPIRATE  Final   Special Requests NONE  Final   Gram Stain   Final    FEW WBC PRESENT,BOTH PMN AND MONONUCLEAR RARE GRAM POSITIVE COCCI RARE GRAM POSITIVE RODS    Culture  Final    MODERATE Normal respiratory flora-no Staph aureus or Pseudomonas seen Performed at [place] [address], [place], [place] [address]    Report Status 04/25/2021 FINAL  Final     Time coordinating discharge: 40 minutes  SIGNED:   Elmarie Shiley, MD  [facility]

## 2021-04-26 NOTE — Progress Notes (Addendum)
CSW received consult for substance use resources and psychiatry resources for patient. CSW spoke with patient at [facility]. Patient comes from home with spouse. CSW offered patient resources for substance use and psychiatry resources. Patient accepted. Patient requested resources for medicaid and disability. CSW provided patient with resources for medicaid and disability. Patient gave CSW permission to reach out to financial counseling to screen patient for medicaid and disability. CSW informed patient to also follow up with resources regarding medicaid and disability. Patient accepted resources  and thanked CSW.Patient reports no transportation needs. Patients spouse is going to pick her up at dc.All questions answered. No further questions reported at this time CSW  reached out to [place] with financial counseling to see if we can screen patient for medicaid and disability. Janett Billow with financial counseling has referred her account to [facility] for a Medicaid/Disability screening.

## 2021-04-26 NOTE — Evaluation (Signed)
Occupational Therapy Evaluation Patient Details Name: Morgan Davenport MRN: [id number] DOB: 16-Apr-1961 Today's Date: 04/26/2021    History of Present Illness 60 y.o. F admitted to [facility] on 04/23/21 due to an overdose. Pt was intubated on 7/26 and extubated on 7/27. No known prior medical history.   Clinical Impression   PT admitted for concerns listed above. PTA pt reported independence with all ADL's and IADL's, including cooking, cleaning, and driving. At this time, pt demonstrated continued independence with all ADL's and functional mobility. Pt is eager to go home. Acute OT will sign off at this time, as pt has no OT needs.     Follow Up Recommendations  No OT follow up    Equipment Recommendations  None recommended by OT    Recommendations for Other Services       Precautions / Restrictions Precautions Precautions: None Restrictions Weight Bearing Restrictions: No      Mobility Bed Mobility Overal bed mobility: Independent                  Transfers Overall transfer level: Independent Equipment used: None             General transfer comment: sit<>stand from bed in lowest setting and standard toilet, with no assist needed    Balance Overall balance assessment: No apparent balance deficits (not formally assessed)                                         ADL either performed or assessed with clinical judgement   ADL Overall ADL's : Independent;At baseline                                       General ADL Comments: Pt able to complete all ADL's with no assist. ROM and strength WFL to complete all ADL's and functional mobility.     Vision Baseline Vision/History: No visual deficits Patient Visual Report: No change from baseline Vision Assessment?: No apparent visual deficits     Perception Perception Perception Tested?: No   Praxis Praxis Praxis tested?: Not tested    Pertinent Vitals/Pain Pain Assessment:  No/denies pain     Hand Dominance Right   Extremity/Trunk Assessment Upper Extremity Assessment Upper Extremity Assessment: Overall WFL for tasks assessed   Lower Extremity Assessment Lower Extremity Assessment: Defer to PT evaluation   Cervical / Trunk Assessment Cervical / Trunk Assessment: Normal   Communication Communication Communication: No difficulties   Cognition Arousal/Alertness: Awake/alert Behavior During Therapy: WFL for tasks assessed/performed Overall Cognitive Status: Within Functional Limits for tasks assessed                                 General Comments: Working with psych due to drug overdose   General Comments  VSS on RA    Exercises     Shoulder Instructions      Home Living Family/patient expects to be discharged to:: Private residence Living Arrangements: Spouse/significant other Available Help at Discharge: Family Type of Home: House Home Access: Stairs to enter Technical brewer of Steps: 3 steps Entrance Stairs-Rails: None Home Layout: One level     Bathroom Shower/Tub: Teacher, early years/pre: Standard Bathroom Accessibility: No   Home Equipment: None  Prior Functioning/Environment Level of Independence: Independent                 OT Problem List: Decreased strength;Decreased activity tolerance      OT Treatment/Interventions:      OT Goals(Current goals can be found in the care plan section) Acute Rehab OT Goals Patient Stated Goal: To get home to her dog OT Goal Formulation: All assessment and education complete, DC therapy Time For Goal Achievement: 04/26/21 Potential to Achieve Goals: Good  OT Frequency:     Barriers to D/C:            Co-evaluation              AM-PAC OT "6 Clicks" Daily Activity     Outcome Measure Help from another person eating meals?: None Help from another person taking care of personal grooming?: None Help from another person  toileting, which includes using toliet, bedpan, or urinal?: None Help from another person bathing (including washing, rinsing, drying)?: None Help from another person to put on and taking off regular upper body clothing?: None Help from another person to put on and taking off regular lower body clothing?: None 6 Click Score: 24   End of Session Nurse Communication: Mobility status  Activity Tolerance: Patient tolerated treatment well Patient left: in bed;with call bell/phone within reach  OT Visit Diagnosis: Unsteadiness on feet (R26.81);History of falling (Z91.81)                Time: [id number] OT Time Calculation (min): 16 min Charges:  OT General Charges $OT Visit: 1 Visit OT Evaluation $OT Eval Low Complexity: Causey., OTR/L Acute Rehabilitation  Kyshaun Barnette Elane Shaw Dobek 04/26/2021, 10:07 AM

## 2021-04-28 LAB — CULTURE, BLOOD (ROUTINE X 2)
Culture: NO GROWTH
Culture: NO GROWTH

## 2021-07-12 ENCOUNTER — Ambulatory Visit (INDEPENDENT_AMBULATORY_CARE_PROVIDER_SITE_OTHER): Payer: Self-pay | Admitting: Primary Care

## 2022-09-30 ENCOUNTER — Ambulatory Visit: Payer: Self-pay

## 2022-09-30 NOTE — Telephone Encounter (Signed)
  Chief Complaint: Needs refills on current medications Symptoms:  Frequency: no Pertinent Negatives: Patient denies  Disposition: '[]'$ ED /'[x]'$ Urgent Care (no appt availability in office) / '[]'$ Appointment(In office/virtual)/ '[]'$  [facility]/ '[]'$ Home Care/ '[]'$ Refused Recommended Disposition /'[]'$ [place] [facility]/ '[]'$  Follow-up with PCP Additional Notes: Pt is out of her current medications. Pt has NP appt for February.  Pt will call old provider for refills or go to UC for refills.     Summary: Medication Advice   Pt is calling to report that she only has 5 days of medication. Will not become a patient of Michelle until 11/17/22. Would like to know what she can do to receive more medication? Please advise       Reason for Disposition  Prescription request for new medicine (not a refill)  Answer Assessment - Initial Assessment Questions 1. DRUG NAME: "What medicine do you need to have refilled?"     HTN meds diabetic meds 2. REFILLS REMAINING: "How many refills are remaining?" (Note: The label on the medicine or pill bottle will show how many refills are remaining. If there are no refills remaining, then a renewal may be needed.)     none 3. EXPIRATION DATE: "What is the expiration date?" (Note: The label states when the prescription will expire, and thus can no longer be refilled.)      4. PRESCRIBING HCP: "Who prescribed it?" Reason: If prescribed by specialist, call should be referred to that group.      5. SYMPTOMS: "Do you have any symptoms?"      6. PREGNANCY: "Is there any chance that you are pregnant?" "When was your last menstrual period?"  Protocols used: Medication Refill and Renewal Call-A-AH

## 2022-10-01 ENCOUNTER — Ambulatory Visit (HOSPITAL_COMMUNITY): Payer: Commercial Managed Care - HMO

## 2022-11-17 ENCOUNTER — Ambulatory Visit (INDEPENDENT_AMBULATORY_CARE_PROVIDER_SITE_OTHER): Payer: Self-pay | Admitting: Primary Care

## 2022-11-24 ENCOUNTER — Ambulatory Visit (INDEPENDENT_AMBULATORY_CARE_PROVIDER_SITE_OTHER): Payer: Commercial Managed Care - HMO | Admitting: Primary Care

## 2022-11-24 VITALS — BP 175/80 | HR 110 | Temp 101.8°F | Resp 16 | Ht 64.0 in | Wt 93.6 lb

## 2022-11-24 DIAGNOSIS — R0989 Other specified symptoms and signs involving the circulatory and respiratory systems: Secondary | ICD-10-CM | POA: Diagnosis not present

## 2022-11-24 NOTE — Progress Notes (Signed)
New Patient Office Visit  Subjective    Patient ID: Morgan Davenport, female    DOB: 27-May-1961  Age: 62 y.o. MRN: [id number]  CC: Establish care   HPI [name] presents to establish care. She voices concern of - shortness of breath, fever, chills and sore throat.    Outpatient Encounter Medications as of 11/24/2022  Medication Sig   amLODipine (NORVASC) 5 MG tablet Take 1 tablet (5 mg total) by mouth daily.   amoxicillin-clavulanate (AUGMENTIN) 875-125 MG tablet Take 1 tablet by mouth every 12 (twelve) hours.   clonazePAM (KLONOPIN) 0.5 MG tablet Take 0.5 mg by mouth 2 (two) times daily as needed for anxiety.   diclofenac (VOLTAREN) 75 MG EC tablet Take 1 tablet (75 mg total) by mouth 2 (two) times daily. (Patient not taking: Reported on 04/20/2017)   glipiZIDE (GLUCOTROL) 5 MG tablet Take 5 mg by mouth 2 (two) times daily.    glipiZIDE (GLUCOTROL) 5 MG tablet Take 5 mg by mouth 2 (two) times daily.   hydrochlorothiazide (HYDRODIURIL) 25 MG tablet Take 25 mg by mouth daily.   hydrochlorothiazide (HYDRODIURIL) 25 MG tablet Take 1 tablet (25 mg total) by mouth daily.   HYDROcodone-acetaminophen (NORCO/VICODIN) 5-325 MG tablet Take 1-2 tablets by mouth every 6 (six) hours as needed.   hydrOXYzine (ATARAX/VISTARIL) 25 MG tablet Take 1 tablet (25 mg total) by mouth 3 (three) times daily as needed for anxiety. (Patient not taking: Reported on 12/29/2019)   losartan (COZAAR) 50 MG tablet Take 50 mg by mouth daily.   losartan (COZAAR) 50 MG tablet Take 1 tablet (50 mg total) by mouth daily.   mirtazapine (REMERON) 15 MG tablet Take 1 tablet (15 mg total) by mouth at bedtime. For depression/sleep   mirtazapine (REMERON) 30 MG tablet Take 1 tablet (30 mg total) by mouth at bedtime.   Multiple Vitamin (MULITIVITAMIN WITH MINERALS) TABS Take 1 tablet by mouth daily.   sertraline (ZOLOFT) 100 MG tablet Take 1 tablet (100 mg total) by mouth daily.   sertraline (ZOLOFT) 50 MG tablet Take 3  tablets (150 mg total) by mouth daily. For depression   simvastatin (ZOCOR) 10 MG tablet Take 10 mg by mouth at bedtime.    traZODone (DESYREL) 50 MG tablet Take 1 tablet (50 mg total) by mouth at bedtime as needed for sleep. (Patient not taking: Reported on 12/29/2019)   No facility-administered encounter medications on file as of 11/24/2022.    Past Medical History:  Diagnosis Date   Carotid artery occlusion    Depression    Diabetes mellitus    Headache    Hypertension     Past Surgical History:  Procedure Laterality Date   ABDOMINAL HYSTERECTOMY  Dec. 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   Right-handed.   1-2 cups caffeine per day.   Social Determinants of Health   Financial Resource Strain: Not on file  Food Insecurity: Not on file  Transportation Needs: Not on file  Physical Activity: Not on file  Stress:  Not on file  Social Connections: Not on file  Intimate Partner Violence: Not on file    Review of Systems  All other systems reviewed and are negative.      Objective   Blood Pressure (Abnormal) 175/80   Pulse (Abnormal) 110   Temperature (Abnormal) 101.8 F (38.8 C)   Respiration 16   Height '5\' 4"'$  (1.626 m)   Weight 93 lb 9.6 oz (42.5 kg)   Oxygen Saturation 95%   Body Mass Index 16.07 kg/m  PE General: Vital signs reviewed.  Patient is underweight  in no acute distress and cooperative with exam. Head: Normocephalic and atraumatic. Eyes: EOMI, conjunctivae normal, no scleral icterus. Neck: Supple, trachea midline, normal ROM, no JVD, masses, thyromegaly, or carotid bruit present. Cardiovascular: RRR, S1 normal, S2 normal, no murmurs, gallops, or rubs. Pulmonary/Chest: Clear to auscultation bilaterally, no wheezes, rales, or rhonchi. Abdominal: Soft, non-tender, non-distended, BS +, no masses, organomegaly, or guarding present. Musculoskeletal: No joint deformities, erythema, or stiffness, ROM full and nontender. Extremities: No lower extremity edema bilaterally,  pulses symmetric and intact bilaterally. No cyanosis or clubbing. Neurological: A&O x3, Strength is normal Skin: Warm, dry and intact. No rashes or erythema. Psychiatric: Normal mood and affect. speech and behavior is normal. Cognition and memory are normal.      Assessment & Plan:  Morgan Davenport was seen today for new patient (initial visit) and uri.  Diagnoses and all orders for this visit:  Respiratory symptoms -     COVID-19, Flu A+B and RSV    Kerin Perna, NP

## 2022-11-25 ENCOUNTER — Ambulatory Visit (INDEPENDENT_AMBULATORY_CARE_PROVIDER_SITE_OTHER): Payer: Self-pay | Admitting: Primary Care

## 2022-11-26 ENCOUNTER — Telehealth: Payer: Self-pay

## 2022-11-26 LAB — COVID-19, FLU A+B AND RSV
Influenza A, NAA: NOT DETECTED
Influenza B, NAA: NOT DETECTED
RSV, NAA: NOT DETECTED
SARS-CoV-2, NAA: NOT DETECTED

## 2022-11-26 NOTE — Telephone Encounter (Signed)
Patient presented to the unit today COVID results, results were printed for patient. She had no other questions or concerns today.

## 2022-11-28 ENCOUNTER — Encounter (INDEPENDENT_AMBULATORY_CARE_PROVIDER_SITE_OTHER): Payer: Self-pay | Admitting: Primary Care

## 2022-11-28 ENCOUNTER — Ambulatory Visit: Payer: Self-pay

## 2022-11-28 NOTE — Telephone Encounter (Signed)
Pt called, unable to LVM d/t VM full.   Summary: neg covid but signs   Pt called saying she was in  Monday for a covid and flu test.  They were negative but she feels really bad.  She is not getting any better.  Please advise  [phone]

## 2022-11-28 NOTE — Telephone Encounter (Signed)
Reason for Disposition  Common cold with no complications    Diagnosed with a virus on 11/24/2022.  All respiratory tests were negative.  Answer Assessment - Initial Assessment Questions 1. ONSET: "When did the nasal discharge start?"      I'm not feeling better.   I saw Juluis Mire, NP on Monday.   I'm not better.   No fever now but body aches, congestion in chest mild shortness of breath when I go to the bathroom because I feel so weak.  This seems like it is never going away.   The cough has made my chest sore.   Denies chest tightness or shortness of breath at rest.   I feel weak and I'm sick of feeling so bad.    I educated her on why Sharyn Lull didn't prescribe antibiotics since she diagnosed it as a virus since all her tests were negative. 2. AMOUNT: "How much discharge is there?"      I blowing my nose  3. COUGH: "Do you have a cough?" If Yes, ask: "Describe the color of your sputum" (clear, white, yellow, green)     The cough has got my chest sore I've coughed so much. 4. RESPIRATORY DISTRESS: "Describe your breathing."      Mild shortness of breath when going to the bathroom.   It's because I feel so weak. 5. FEVER: "Do you have a fever?" If Yes, ask: "What is your temperature, how was it measured, and when did it start?"     Not now but I did on Monday. 6. SEVERITY: "Overall, how bad are you feeling right now?" (e.g., doesn't interfere with normal activities, staying home from school/work, staying in bed)      I feel terrible.   Weak, laying around the house this week. 7. OTHER SYMPTOMS: "Do you have any other symptoms?" (e.g., sore throat, earache, wheezing, vomiting)     Had some diarrhea yesterday but none today.   No N/V.   Non productive cough. 8. PREGNANCY: "Is there any chance you are pregnant?" "When was your last menstrual period?"     Not asked due to age  Protocols used: Common Cold-A-AH  Chief Complaint: Not feeling better since being seen on Monday and diagnosed  with a URI viral in nature.   All her tests were negative for respiratory illnesses. Symptoms: Non productive cough, runny nose, body aches, no fever now, chest sore from coughing so much, mild shortness of breath Frequency: Still feeling bad since Monday Pertinent Negatives: Patient denies coughing up anything, no wheezing, no fever, no N/V.   No diarrhea today.   Had a little yesterday only.   Disposition: '[]'$ ED /'[]'$ Urgent Care (no appt availability in office) / '[]'$ Appointment(In office/virtual)/ '[]'$  [facility]/ '[x]'$ Home Care/ '[]'$ Refused Recommended Disposition /'[]'$ [place] [facility]/ '[]'$  Follow-up with PCP Additional Notes: I went over the Home Care Advice.    She was not taking anything OTC for her symptoms.   Mucinex DM was recommended for her symptoms.    Went over the s/s to go to the urgent care or ED over the weekend should they occur.    Shortness of breath at rest, chest tightness, wheezing, fever, coughing up green mucus or blowing green mucus from her nose.    Pt. Was agreeable to this plan and thanked me very much for my suggestions.

## 2022-12-01 ENCOUNTER — Other Ambulatory Visit (INDEPENDENT_AMBULATORY_CARE_PROVIDER_SITE_OTHER): Payer: Self-pay | Admitting: Primary Care

## 2022-12-01 DIAGNOSIS — R0989 Other specified symptoms and signs involving the circulatory and respiratory systems: Secondary | ICD-10-CM

## 2022-12-01 NOTE — Telephone Encounter (Signed)
Will forward to provider  

## 2022-12-02 NOTE — Telephone Encounter (Signed)
Pt has an appt on 3/7 with provider will obtain labs then if that's okay

## 2022-12-04 ENCOUNTER — Ambulatory Visit (INDEPENDENT_AMBULATORY_CARE_PROVIDER_SITE_OTHER): Payer: Commercial Managed Care - HMO | Admitting: Primary Care

## 2023-01-07 ENCOUNTER — Ambulatory Visit (INDEPENDENT_AMBULATORY_CARE_PROVIDER_SITE_OTHER): Payer: Self-pay | Admitting: Primary Care

## 2023-05-05 ENCOUNTER — Encounter: Payer: Commercial Managed Care - HMO | Admitting: Student

## 2023-05-18 ENCOUNTER — Encounter: Payer: Commercial Managed Care - HMO | Admitting: Student

## 2024-01-16 ENCOUNTER — Ambulatory Visit (HOSPITAL_COMMUNITY)
Admission: EM | Admit: 2024-01-16 | Discharge: 2024-01-16 | Disposition: A | Discharge status: Home / Self Care | Payer: Self-pay | Attending: Internal Medicine | Admitting: Internal Medicine

## 2024-01-16 ENCOUNTER — Encounter (HOSPITAL_COMMUNITY): Payer: Self-pay

## 2024-01-16 DIAGNOSIS — N898 Other specified noninflammatory disorders of vagina: Secondary | ICD-10-CM | POA: Insufficient documentation

## 2024-01-16 DIAGNOSIS — I10 Essential (primary) hypertension: Secondary | ICD-10-CM | POA: Insufficient documentation

## 2024-01-16 NOTE — ED Triage Notes (Addendum)
 Pt c/o vaginal discharge with a bad fishy smell and itching x3 days. Requesting STD testing.

## 2024-01-16 NOTE — Discharge Instructions (Addendum)
 Screening swab done today and results will be available in 24-48 hours. We will contact you if we need to arrange additional treatment based on your testing. Negative results will be on your MyChart account.   Can try over-the-counter boric acid vaginal suppositories to help with symptoms.  Blood pressure was addressed during visit and recommended you monitor this at home.  Take your blood pressure medication as soon as you get home today and rest and relax.  If you have a blood pressure cuff at home, recheck this later on today to make sure that the blood pressure is coming down.  If it remains significantly elevated, or you develop concerning symptoms then you should go to the emergency room for further evaluation.  You should follow up with PCP for ongoing management. Discussed alarm symptoms that would warrant emergent evaluation at the emergency room including worsening headaches, chest pain, strokelike symptoms.

## 2024-01-16 NOTE — ED Provider Notes (Signed)
 [facility]    CSN: [id number] Arrival date & time: 01/16/24  1001      History   Chief Complaint Chief Complaint  Patient presents with   Vaginal Discharge    HPI Morgan Davenport is a 63 y.o. female.   63 y.o. female who presents to urgent care with complaints of vaginal discharge with foul odor for 3 days. Husband doesn't have any symptoms. Denies urinary symptoms, abdominal pain, fevers, chills.  The patient does relate a concern that her husband may have been with someone else.  In the office the blood pressure is significantly elevated but the patient denies that it is normally this high.  She reports that she has been under a lot of stress today and recently and reports that she had an argument with her husband right before coming here.  She has not taken her blood pressure medication today.  She denies headaches at current, chest pain, shortness of breath, lower extremity swelling or unilateral symptoms.   Vaginal Discharge Associated symptoms: no abdominal pain, no dysuria, no fever and no vomiting     Past Medical History:  Diagnosis Date   Carotid artery occlusion    Depression    Diabetes mellitus    Headache    Hypertension     Patient Active Problem List   Diagnosis Date Noted   Acute respiratory failure with hypoxia (HCC) 04/26/2021   Drug overdose 04/26/2021   Drug overdose, intentional (HCC) 04/23/2021   Depression 07/23/2019   MDD (major depressive disorder) 07/21/2019   Chronic migraine 10/02/2015   Occlusion and stenosis of carotid artery without mention of cerebral infarction 03/28/2013    Past Surgical History:  Procedure Laterality Date   ABDOMINAL HYSTERECTOMY  Dec. 24, 2008    OB History   No obstetric history on file.      Home Medications    Prior to Admission medications   Medication Sig Start Date End Date Taking? Authorizing Provider  amLODipine  (NORVASC ) 5 MG tablet Take 1 tablet (5 mg total) by mouth daily.  04/26/21   Regalado, Belkys A, MD  amoxicillin -clavulanate (AUGMENTIN ) 875-125 MG tablet Take 1 tablet by mouth every 12 (twelve) hours. 06/25/20   Tonya Fredrickson, MD  clonazePAM  (KLONOPIN ) 0.5 MG tablet Take 0.5 mg by mouth 2 (two) times daily as needed for anxiety.    [provider]  glipiZIDE  (GLUCOTROL ) 5 MG tablet Take 5 mg by mouth 2 (two) times daily.     [provider]  glipiZIDE  (GLUCOTROL ) 5 MG tablet Take 5 mg by mouth 2 (two) times daily. 03/22/21   [provider]  hydrochlorothiazide  (HYDRODIURIL ) 25 MG tablet Take 25 mg by mouth daily.    [provider]  hydrochlorothiazide  (HYDRODIURIL ) 25 MG tablet Take 1 tablet (25 mg total) by mouth daily. 04/26/21   Regalado, Belkys A, MD  losartan  (COZAAR ) 50 MG tablet Take 50 mg by mouth daily.    [provider]  losartan  (COZAAR ) 50 MG tablet Take 1 tablet (50 mg total) by mouth daily. 04/26/21   Regalado, Belkys A, MD  mirtazapine  (REMERON ) 30 MG tablet Take 1 tablet (30 mg total) by mouth at bedtime. 04/26/21   Regalado, Belkys A, MD  Multiple Vitamin (MULITIVITAMIN WITH MINERALS) TABS Take 1 tablet by mouth daily.    [provider]  sertraline  (ZOLOFT ) 100 MG tablet Take 1 tablet (100 mg total) by mouth daily. 04/26/21   Regalado, Belkys A, MD  sertraline  (ZOLOFT ) 50  MG tablet Take 3 tablets (150 mg total) by mouth daily. For depression 07/25/19   Asuncion Layer I, NP  simvastatin  (ZOCOR ) 10 MG tablet Take 10 mg by mouth at bedtime.     [provider]    Family History Family History  Adopted: Yes  Problem Relation Age of Onset   Diabetes Mother    Hypertension Mother    Hyperlipidemia Mother    Heart disease Mother        NOT before age 15   Heart attack Mother    Stomach cancer Father     Social History Social History   Tobacco Use   Smoking status: Light Smoker    Current packs/day: 0.25    Average packs/day: 0.3 packs/day for 10.0 years (2.5 ttl pk-yrs)     Types: Cigarettes   Smokeless tobacco: Never  Vaping Use   Vaping status: Never Used  Substance Use Topics   Alcohol use: Yes    Alcohol/week: 2.0 standard drinks of alcohol    Types: 2 Cans of beer per week    Comment: every other day   Drug use: Yes    Types: "Crack" cocaine, Marijuana, Cocaine     Allergies   Tramadol and Tramadol   Review of Systems Review of Systems  Constitutional:  Negative for chills and fever.  HENT:  Negative for ear pain and sore throat.   Eyes:  Negative for pain and visual disturbance.  Respiratory:  Negative for cough and shortness of breath.   Cardiovascular:  Negative for chest pain, palpitations and leg swelling.  Gastrointestinal:  Negative for abdominal pain and vomiting.  Genitourinary:  Positive for vaginal discharge. Negative for dysuria and hematuria.       Vaginal odor  Musculoskeletal:  Negative for arthralgias and back pain.  Skin:  Negative for color change and rash.  Neurological:  Negative for seizures, syncope and headaches.  All other systems reviewed and are negative.    Physical Exam Triage Vital Signs ED Triage Vitals  Encounter Vitals Group     BP 01/16/24 1023 (!) 220/115     Systolic BP Percentile --      Diastolic BP Percentile --      Pulse Rate 01/16/24 1023 84     Resp 01/16/24 1023 18     Temp 01/16/24 1023 98.3 F (36.8 C)     Temp Source 01/16/24 1023 Oral     SpO2 01/16/24 1023 98 %     Weight --      Height --      Head Circumference --      Peak Flow --      Pain Score 01/16/24 1025 0     Pain Loc --      Pain Education --      Exclude from Growth Chart --    No data found.  Updated Vital Signs BP (!) 220/115 (BP Location: Left Arm)   Pulse 84   Temp 98.3 F (36.8 C) (Oral)   Resp 18   SpO2 98%   Visual Acuity Right Eye Distance:   Left Eye Distance:   Bilateral Distance:    Right Eye Near:   Left Eye Near:    Bilateral Near:     Physical Exam Vitals and nursing note reviewed.   Constitutional:      General: She is not in acute distress.    Appearance: She is well-developed.  HENT:     Head: Normocephalic and atraumatic.  Eyes:     Conjunctiva/sclera: Conjunctivae normal.  Cardiovascular:     Rate and Rhythm: Normal rate and regular rhythm.     Pulses: Normal pulses.     Heart sounds: Normal heart sounds. No murmur heard. Pulmonary:     Effort: Pulmonary effort is normal. No respiratory distress.     Breath sounds: Normal breath sounds.  Abdominal:     Palpations: Abdomen is soft.     Tenderness: There is no abdominal tenderness.  Musculoskeletal:        General: No swelling.     Cervical back: Neck supple.  Skin:    General: Skin is warm and dry.     Capillary Refill: Capillary refill takes less than 2 seconds.  Neurological:     Mental Status: She is alert.  Psychiatric:        Mood and Affect: Mood normal.      UC Treatments / Results  Labs (all labs ordered are listed, but only abnormal results are displayed) Labs Reviewed - No data to display  EKG   Radiology No results found.  Procedures Procedures (including critical care time)  Medications Ordered in UC Medications - No data to display  Initial Impression / Assessment and Plan / UC Course  I have reviewed the triage vital signs and the nursing notes.  Pertinent labs & imaging results that were available during my care of the patient were reviewed by me and considered in my medical decision making (see chart for details).     Vaginal discharge  Vaginal odor  Essential hypertension   Screening swab done today and results will be available in 24-48 hours. We will contact you if we need to arrange additional treatment based on your testing. Negative results will be on your MyChart account.   Can try over-the-counter boric acid vaginal suppositories to help with symptoms.  Blood pressure was addressed during visit and recommended you monitor this at home.  Take your blood  pressure medication as soon as you get home today and rest and relax.  If you have a blood pressure cuff at home, recheck this later on today to make sure that the blood pressure is coming down.  If it remains significantly elevated, or you develop concerning symptoms then you should go to the emergency room for further evaluation.  You should follow up with PCP for ongoing management. Discussed alarm symptoms that would warrant emergent evaluation at the emergency room including worsening headaches, chest pain, strokelike symptoms.   Final Clinical Impressions(s) / UC Diagnoses   Final diagnoses:  None   Discharge Instructions   None    ED Prescriptions   None    PDMP not reviewed this encounter.   Kreg Pesa, [place] 01/16/24 1053

## 2024-01-18 ENCOUNTER — Telehealth (HOSPITAL_COMMUNITY): Payer: Self-pay

## 2024-01-18 LAB — CERVICOVAGINAL ANCILLARY ONLY
Bacterial Vaginitis (gardnerella): NEGATIVE
Chlamydia: NEGATIVE
Comment: NEGATIVE
Comment: NEGATIVE
Comment: NEGATIVE
Comment: NORMAL
Neisseria Gonorrhea: NEGATIVE
Trichomonas: POSITIVE — AB

## 2024-01-18 MED ORDER — METRONIDAZOLE 500 MG PO TABS: 500.0000 mg | ORAL_TABLET | Freq: Two times a day (BID) | ORAL | 0 refills | Status: AC

## 2024-01-18 NOTE — Telephone Encounter (Signed)
 Per protocol, pt requires tx with metronidazole. Attempted to reach patient x1. VM full.  Rx sent to pharmacy on file.

## 2024-03-14 ENCOUNTER — Ambulatory Visit
Admission: EM | Admit: 2024-03-14 | Discharge: 2024-03-14 | Disposition: A | Discharge status: Home / Self Care | Payer: Self-pay

## 2024-03-14 ENCOUNTER — Encounter: Payer: Self-pay | Admitting: Emergency Medicine

## 2024-03-14 DIAGNOSIS — Z760 Encounter for issue of repeat prescription: Secondary | ICD-10-CM

## 2024-03-14 DIAGNOSIS — I10 Essential (primary) hypertension: Secondary | ICD-10-CM

## 2024-03-14 MED ORDER — LOSARTAN POTASSIUM 50 MG PO TABS: 50.0000 mg | ORAL_TABLET | Freq: Every day | ORAL | 3 refills | Status: AC

## 2024-03-14 MED ORDER — GLIPIZIDE 5 MG PO TABS: 5.0000 mg | ORAL_TABLET | Freq: Two times a day (BID) | ORAL | 3 refills | Status: AC

## 2024-03-14 MED ORDER — HYDROCHLOROTHIAZIDE 25 MG PO TABS: 25.0000 mg | ORAL_TABLET | Freq: Every day | ORAL | 3 refills | Status: AC

## 2024-03-14 MED ORDER — AMLODIPINE BESYLATE 5 MG PO TABS: 5.0000 mg | ORAL_TABLET | Freq: Every day | ORAL | 3 refills | Status: AC

## 2024-03-14 NOTE — ED Triage Notes (Signed)
 Pt presents with high bp for 2 weeks. States systolic has been in 200s. Not on BP meds

## 2024-03-14 NOTE — Discharge Instructions (Addendum)
  1. Primary hypertension (Primary) - ED EKG completed in UC shows normal sinus rhythm with prominent LVH throughout multiple apically V3, V4, V5, V6.  No sign of STEMI, ventricular rate of 70 bpm - amLODipine  (NORVASC ) 5 MG tablet; Take 1 tablet (5 mg total) by mouth daily.  Dispense: 30 tablet; Refill: 3 - losartan  (COZAAR ) 50 MG tablet; Take 1 tablet (50 mg total) by mouth daily.  Dispense: 30 tablet; Refill: 3 - hydrochlorothiazide  (HYDRODIURIL ) 25 MG tablet; Take 1 tablet (25 mg total) by mouth daily.  Dispense: 30 tablet; Refill: 3 - Patient advised if she develops any chest pain, shortness of breath, dizziness, weakness, blurred vision, worsening headache, altered mental status, numbness or tingling in extremities, to follow-up immediately in the ER for further evaluation and management.  2. Medication refill - glipiZIDE  (GLUCOTROL ) 5 MG tablet; Take 1 tablet (5 mg total) by mouth 2 (two) times daily.  Dispense: 60 tablet; Refill: 3 -Continue to monitor symptoms for any change in severity if there is any escalation of current symptoms or development of new symptoms follow-up in ER for further evaluation and management.

## 2024-03-14 NOTE — ED Provider Notes (Signed)
 [facility]  Note:  This document was prepared using Dragon voice recognition software and may include unintentional dictation errors.  MRN: [id number] DOB: [birth year]  Subjective:   Morgan Davenport is a 63 y.o. female presenting for high blood pressure with a systolic pressure over 200 persistent for 2 weeks.  Patient reports that she has not taken any blood pressure medication in approximately 6 months.  Patient states she does not have insurance medication since retiring.  Patient reports headache, no blurred vision, dizziness, chest pain, shortness of breath.  Patient states that if I can prescribe her medications to her pharmacy she will see if her friend will pay for her meds.  Patient also requesting refill of glipizide  for diabetes.  Patient reports that she does not currently have a primary care provider and has no one to prescribe her medications if she could afford them.  No current facility-administered medications for this encounter.  Current Outpatient Medications:    amLODipine  (NORVASC ) 5 MG tablet, Take 1 tablet (5 mg total) by mouth daily., Disp: 30 tablet, Rfl: 3   amoxicillin -clavulanate (AUGMENTIN ) 875-125 MG tablet, Take 1 tablet by mouth every 12 (twelve) hours. (Patient not taking: Reported on 03/14/2024), Disp: 20 tablet, Rfl: 0   clonazePAM  (KLONOPIN ) 0.5 MG tablet, Take 0.5 mg by mouth 2 (two) times daily as needed for anxiety., Disp: , Rfl:    glipiZIDE  (GLUCOTROL ) 5 MG tablet, Take 5 mg by mouth 2 (two) times daily. , Disp: , Rfl:    glipiZIDE  (GLUCOTROL ) 5 MG tablet, Take 1 tablet (5 mg total) by mouth 2 (two) times daily., Disp: 60 tablet, Rfl: 3   hydrochlorothiazide  (HYDRODIURIL ) 25 MG tablet, Take 25 mg by mouth daily., Disp: , Rfl:    hydrochlorothiazide  (HYDRODIURIL ) 25 MG tablet, Take 1 tablet (25 mg total) by mouth daily., Disp: 30 tablet, Rfl: 3   losartan  (COZAAR ) 50 MG tablet, Take 50 mg by mouth daily., Disp: , Rfl:    losartan   (COZAAR ) 50 MG tablet, Take 1 tablet (50 mg total) by mouth daily., Disp: 30 tablet, Rfl: 3   mirtazapine  (REMERON ) 30 MG tablet, Take 1 tablet (30 mg total) by mouth at bedtime., Disp: 30 tablet, Rfl: 2   Multiple Vitamin (MULITIVITAMIN WITH MINERALS) TABS, Take 1 tablet by mouth daily., Disp: , Rfl:    sertraline  (ZOLOFT ) 100 MG tablet, Take 1 tablet (100 mg total) by mouth daily., Disp: 30 tablet, Rfl: 3   sertraline  (ZOLOFT ) 50 MG tablet, Take 3 tablets (150 mg total) by mouth daily. For depression, Disp: 90 tablet, Rfl: 0   simvastatin  (ZOCOR ) 10 MG tablet, Take 10 mg by mouth at bedtime. , Disp: , Rfl:    Allergies  Allergen Reactions   Tramadol Itching and Other (See Comments)    Reaction:  Headaches    Tramadol Other (See Comments)    unknown    Past Medical History:  Diagnosis Date   Carotid artery occlusion    Depression    Diabetes mellitus    Headache    Hypertension      Past Surgical History:  Procedure Laterality Date   ABDOMINAL HYSTERECTOMY  Dec. 24, 2008    Family History  Adopted: Yes  Problem Relation Age of Onset   Diabetes Mother    Hypertension Mother    Hyperlipidemia Mother    Heart disease Mother        NOT before age 64   Heart attack Mother    Stomach cancer  Father     Social History   Tobacco Use   Smoking status: Light Smoker    Current packs/day: 0.25    Average packs/day: 0.3 packs/day for 10.0 years (2.5 ttl pk-yrs)    Types: Cigarettes   Smokeless tobacco: Never  Vaping Use   Vaping status: Never Used  Substance Use Topics   Alcohol use: Yes    Alcohol/week: 2.0 standard drinks of alcohol    Types: 2 Cans of beer per week    Comment: every other day   Drug use: Yes    Types: Crack cocaine, Marijuana, Cocaine    ROS Refer to HPI for ROS details.  Objective:    Vitals: BP (!) 224/120 (BP Location: Right Arm)   Pulse 83   Temp 97.7 F (36.5 C) (Oral)   Resp 17   SpO2 95%   Physical Exam Vitals and nursing note  reviewed.  Constitutional:      General: She is not in acute distress.    Appearance: She is well-developed. She is not ill-appearing or toxic-appearing.  HENT:     Head: Normocephalic and atraumatic.   Cardiovascular:     Rate and Rhythm: Normal rate and regular rhythm.     Heart sounds: Normal heart sounds. No murmur heard. Pulmonary:     Effort: Pulmonary effort is normal. No respiratory distress.     Breath sounds: No wheezing or rhonchi.  Chest:     Chest wall: No tenderness.   Skin:    General: Skin is warm and dry.   Neurological:     General: No focal deficit present.     Mental Status: She is alert and oriented to person, place, and time.   Psychiatric:        Mood and Affect: Mood normal.        Behavior: Behavior normal.     Procedures  No results found for this or any previous visit (from the past 24 hours).  Assessment and Plan :     Discharge Instructions       1. Primary hypertension (Primary) - ED EKG completed in UC shows normal sinus rhythm with prominent LVH throughout multiple apically V3, V4, V5, V6.  No sign of STEMI, ventricular rate of 70 bpm - amLODipine  (NORVASC ) 5 MG tablet; Take 1 tablet (5 mg total) by mouth daily.  Dispense: 30 tablet; Refill: 3 - losartan  (COZAAR ) 50 MG tablet; Take 1 tablet (50 mg total) by mouth daily.  Dispense: 30 tablet; Refill: 3 - hydrochlorothiazide  (HYDRODIURIL ) 25 MG tablet; Take 1 tablet (25 mg total) by mouth daily.  Dispense: 30 tablet; Refill: 3 - Patient advised if she develops any chest pain, shortness of breath, dizziness, weakness, blurred vision, worsening headache, altered mental status, numbness or tingling in extremities, to follow-up immediately in the ER for further evaluation and management.  2. Medication refill - glipiZIDE  (GLUCOTROL ) 5 MG tablet; Take 1 tablet (5 mg total) by mouth 2 (two) times daily.  Dispense: 60 tablet; Refill: 3 -Continue to monitor symptoms for any change in severity if  there is any escalation of current symptoms or development of new symptoms follow-up in ER for further evaluation and management.      [place] Hijazi B Charelle Petrakis   Emmalou Hunger B, [place] 03/14/24 1555

## 2024-03-15 ENCOUNTER — Ambulatory Visit (HOSPITAL_COMMUNITY): Payer: Self-pay

## 2024-05-12 ENCOUNTER — Ambulatory Visit: Payer: Self-pay | Admitting: Nurse Practitioner

## 2024-05-12 DIAGNOSIS — Z7689 Persons encountering health services in other specified circumstances: Secondary | ICD-10-CM

## 2024-05-12 NOTE — Progress Notes (Deleted)
 LILLETTE Kristeen JINNY Gladis, CMA,acting as a Neurosurgeon for Morgan Ada, FNP.,have documented all relevant documentation on the behalf of Morgan Ada, FNP,as directed by  Morgan Ada, FNP while in the presence of Morgan Ada, FNP.  Subjective:  Patient ID: Morgan Davenport , female    DOB: [birth year] , 63 y.o.   MRN: [id number]  No chief complaint on file.   HPI  HPI   Past Medical History:  Diagnosis Date   Carotid artery occlusion    Depression    Diabetes mellitus    Headache    Hypertension      Family History  Adopted: Yes  Problem Relation Age of Onset   Diabetes Mother    Hypertension Mother    Hyperlipidemia Mother    Heart disease Mother        NOT before age 68   Heart attack Mother    Stomach cancer Father      Current Outpatient Medications:    amLODipine (NORVASC) 5 MG tablet, Take 1 tablet (5 mg total) by mouth daily., Disp: 30 tablet, Rfl: 3   amoxicillin-clavulanate (AUGMENTIN) 875-125 MG tablet, Take 1 tablet by mouth every 12 (twelve) hours. (Patient not taking: Reported on 03/14/2024), Disp: 20 tablet, Rfl: 0   clonazePAM (KLONOPIN) 0.5 MG tablet, Take 0.5 mg by mouth 2 (two) times daily as needed for anxiety., Disp: , Rfl:    glipiZIDE (GLUCOTROL) 5 MG tablet, Take 5 mg by mouth 2 (two) times daily. , Disp: , Rfl:    glipiZIDE (GLUCOTROL) 5 MG tablet, Take 1 tablet (5 mg total) by mouth 2 (two) times daily., Disp: 60 tablet, Rfl: 3   hydrochlorothiazide (HYDRODIURIL) 25 MG tablet, Take 25 mg by mouth daily., Disp: , Rfl:    hydrochlorothiazide (HYDRODIURIL) 25 MG tablet, Take 1 tablet (25 mg total) by mouth daily., Disp: 30 tablet, Rfl: 3   losartan (COZAAR) 50 MG tablet, Take 50 mg by mouth daily., Disp: , Rfl:    losartan (COZAAR) 50 MG tablet, Take 1 tablet (50 mg total) by mouth daily., Disp: 30 tablet, Rfl: 3   mirtazapine (REMERON) 30 MG tablet, Take 1 tablet (30 mg total) by mouth at bedtime., Disp: 30 tablet, Rfl: 2   Multiple Vitamin (MULITIVITAMIN WITH  MINERALS) TABS, Take 1 tablet by mouth daily., Disp: , Rfl:    sertraline (ZOLOFT) 100 MG tablet, Take 1 tablet (100 mg total) by mouth daily., Disp: 30 tablet, Rfl: 3   sertraline (ZOLOFT) 50 MG tablet, Take 3 tablets (150 mg total) by mouth daily. For depression, Disp: 90 tablet, Rfl: 0   simvastatin (ZOCOR) 10 MG tablet, Take 10 mg by mouth at bedtime. , Disp: , Rfl:    Allergies  Allergen Reactions   Tramadol Itching and Other (See Comments)    Reaction:  Headaches    Tramadol Other (See Comments)    unknown     Review of Systems   There were no vitals filed for this visit. There is no height or weight on file to calculate BMI.  Wt Readings from Last 3 Encounters:  11/24/22 93 lb 9.6 oz (42.5 kg)  04/26/21 102 lb 11.2 oz (46.6 kg)  06/25/20 104 lb 8 oz (47.4 kg)    The ASCVD Risk score (Arnett DK, et al., 2019) failed to calculate for the following reasons:   The valid systolic blood pressure range is 90 to 200 mmHg   Cannot find a previous HDL lab   Cannot find a previous total  cholesterol lab  Objective:  Physical Exam      Assessment And Plan:  Establishing care with new doctor, encounter for  Severe episode of recurrent major depressive disorder, without psychotic features (HCC)    No follow-ups on file.  Patient was given opportunity to ask questions. Patient verbalized understanding of the plan and was able to repeat key elements of the plan. All questions were answered to their satisfaction.    LILLETTE Morgan Ada, FNP, have reviewed all documentation for this visit. The documentation on 05/12/24 for the exam, diagnosis, procedures, and orders are all accurate and complete.   IF YOU HAVE BEEN REFERRED TO A SPECIALIST, IT MAY TAKE 1-2 WEEKS TO SCHEDULE/PROCESS THE REFERRAL. IF YOU HAVE NOT HEARD FROM US /SPECIALIST IN TWO WEEKS, PLEASE GIVE US  A CALL AT [phone].
